# Patient Record
Sex: Male | Born: 1955 | Race: White | Hispanic: No | Marital: Married | Smoking: Former smoker
Health system: Southern US, Community
[De-identification: ages and names within clinical notes are randomized; demographics above are authoritative.]

## PROBLEM LIST (undated history)

## (undated) DIAGNOSIS — I10 Essential (primary) hypertension: Secondary | ICD-10-CM

## (undated) DIAGNOSIS — I779 Disorder of arteries and arterioles, unspecified: Secondary | ICD-10-CM

## (undated) DIAGNOSIS — I639 Cerebral infarction, unspecified: Secondary | ICD-10-CM

## (undated) DIAGNOSIS — F419 Anxiety disorder, unspecified: Secondary | ICD-10-CM

## (undated) DIAGNOSIS — E119 Type 2 diabetes mellitus without complications: Secondary | ICD-10-CM

## (undated) DIAGNOSIS — N200 Calculus of kidney: Secondary | ICD-10-CM

## (undated) DIAGNOSIS — I2510 Atherosclerotic heart disease of native coronary artery without angina pectoris: Secondary | ICD-10-CM

## (undated) DIAGNOSIS — C61 Malignant neoplasm of prostate: Secondary | ICD-10-CM

## (undated) DIAGNOSIS — F329 Major depressive disorder, single episode, unspecified: Secondary | ICD-10-CM

## (undated) DIAGNOSIS — Z87442 Personal history of urinary calculi: Secondary | ICD-10-CM

## (undated) DIAGNOSIS — E785 Hyperlipidemia, unspecified: Secondary | ICD-10-CM

## (undated) DIAGNOSIS — F32A Depression, unspecified: Secondary | ICD-10-CM

## (undated) HISTORY — DX: Essential (primary) hypertension: I10

## (undated) HISTORY — DX: Disorder of arteries and arterioles, unspecified: I77.9

## (undated) HISTORY — DX: Type 2 diabetes mellitus without complications: E11.9

## (undated) HISTORY — DX: Calculus of kidney: N20.0

## (undated) HISTORY — PX: PROSTATE BIOPSY: SHX241

## (undated) HISTORY — DX: Hyperlipidemia, unspecified: E78.5

## (undated) HISTORY — DX: Malignant neoplasm of prostate: C61

## (undated) HISTORY — PX: CORONARY ARTERY BYPASS GRAFT: SHX141

## (undated) HISTORY — DX: Atherosclerotic heart disease of native coronary artery without angina pectoris: I25.10

## (undated) HISTORY — DX: Cerebral infarction, unspecified: I63.9

## (undated) HISTORY — PX: OTHER SURGICAL HISTORY: SHX169

## (undated) HISTORY — DX: Anxiety disorder, unspecified: F41.9

---
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
## 2009-11-14 DIAGNOSIS — I639 Cerebral infarction, unspecified: Secondary | ICD-10-CM

## 2009-11-14 HISTORY — DX: Cerebral infarction, unspecified: I63.9

## 2010-07-08 ENCOUNTER — Ambulatory Visit: Payer: Self-pay | Admitting: Internal Medicine

## 2010-07-08 ENCOUNTER — Ambulatory Visit: Payer: Self-pay | Admitting: Pulmonary Disease

## 2010-07-08 ENCOUNTER — Inpatient Hospital Stay (HOSPITAL_COMMUNITY)
Admission: EM | Admit: 2010-07-08 | Discharge: 2010-07-16 | Disposition: A | Discharge status: Rehabilitation Facility | Payer: Self-pay | Source: Home / Self Care | Admitting: Emergency Medicine

## 2010-07-09 ENCOUNTER — Encounter (INDEPENDENT_AMBULATORY_CARE_PROVIDER_SITE_OTHER): Payer: Self-pay | Admitting: Emergency Medicine

## 2010-07-09 ENCOUNTER — Ambulatory Visit: Payer: Self-pay | Admitting: Physical Medicine & Rehabilitation

## 2010-07-12 ENCOUNTER — Encounter (INDEPENDENT_AMBULATORY_CARE_PROVIDER_SITE_OTHER): Payer: Self-pay | Admitting: Emergency Medicine

## 2010-07-16 ENCOUNTER — Ambulatory Visit: Payer: Self-pay | Admitting: Physical Medicine & Rehabilitation

## 2010-07-16 ENCOUNTER — Inpatient Hospital Stay (HOSPITAL_COMMUNITY)
Admission: RE | Admit: 2010-07-16 | Discharge: 2010-08-06 | Payer: Self-pay | Admitting: Physical Medicine & Rehabilitation

## 2010-07-24 ENCOUNTER — Ambulatory Visit: Payer: Self-pay | Admitting: Physical Medicine & Rehabilitation

## 2010-07-27 ENCOUNTER — Ambulatory Visit: Payer: Self-pay | Admitting: Psychology

## 2010-08-26 ENCOUNTER — Encounter (HOSPITAL_COMMUNITY)
Admission: RE | Admit: 2010-08-26 | Discharge: 2010-09-25 | Payer: Self-pay | Source: Home / Self Care | Admitting: Family Medicine

## 2010-08-30 ENCOUNTER — Encounter
Admission: RE | Admit: 2010-08-30 | Discharge: 2010-11-10 | Payer: Self-pay | Source: Home / Self Care | Attending: Physical Medicine & Rehabilitation | Admitting: Physical Medicine & Rehabilitation

## 2010-09-03 ENCOUNTER — Ambulatory Visit: Payer: Self-pay | Admitting: Physical Medicine & Rehabilitation

## 2010-09-08 ENCOUNTER — Ambulatory Visit (HOSPITAL_COMMUNITY)
Admission: RE | Admit: 2010-09-08 | Discharge: 2010-09-08 | Payer: Self-pay | Admitting: Physical Medicine & Rehabilitation

## 2010-09-27 ENCOUNTER — Encounter (HOSPITAL_COMMUNITY)
Admission: RE | Admit: 2010-09-27 | Discharge: 2010-10-27 | Payer: Self-pay | Source: Home / Self Care | Attending: Family Medicine | Admitting: Family Medicine

## 2010-10-01 ENCOUNTER — Ambulatory Visit: Payer: Self-pay | Admitting: Physical Medicine & Rehabilitation

## 2010-10-28 ENCOUNTER — Encounter (HOSPITAL_COMMUNITY)
Admission: RE | Admit: 2010-10-28 | Discharge: 2010-11-27 | Payer: Self-pay | Source: Home / Self Care | Attending: Family Medicine | Admitting: Family Medicine

## 2010-11-15 ENCOUNTER — Encounter
Admission: RE | Admit: 2010-11-15 | Discharge: 2010-11-16 | Payer: Self-pay | Source: Home / Self Care | Attending: Physical Medicine & Rehabilitation | Admitting: Physical Medicine & Rehabilitation

## 2010-11-16 ENCOUNTER — Ambulatory Visit
Admission: RE | Admit: 2010-11-16 | Discharge: 2010-11-16 | Payer: Self-pay | Source: Home / Self Care | Attending: Physical Medicine & Rehabilitation | Admitting: Physical Medicine & Rehabilitation

## 2010-11-29 ENCOUNTER — Encounter (HOSPITAL_COMMUNITY)
Admission: RE | Admit: 2010-11-29 | Discharge: 2010-12-14 | Payer: Self-pay | Source: Home / Self Care | Attending: Family Medicine | Admitting: Family Medicine

## 2010-12-05 ENCOUNTER — Encounter: Payer: Self-pay | Admitting: Physical Medicine & Rehabilitation

## 2010-12-17 ENCOUNTER — Ambulatory Visit (HOSPITAL_COMMUNITY)
Admission: RE | Admit: 2010-12-17 | Discharge: 2010-12-17 | Disposition: A | Discharge status: Home / Self Care | Payer: BC Managed Care – PPO | Source: Ambulatory Visit | Attending: Family Medicine | Admitting: Family Medicine

## 2010-12-17 DIAGNOSIS — E119 Type 2 diabetes mellitus without complications: Secondary | ICD-10-CM | POA: Insufficient documentation

## 2010-12-17 DIAGNOSIS — Z5189 Encounter for other specified aftercare: Secondary | ICD-10-CM | POA: Insufficient documentation

## 2010-12-17 DIAGNOSIS — M6281 Muscle weakness (generalized): Secondary | ICD-10-CM | POA: Insufficient documentation

## 2010-12-17 DIAGNOSIS — Z951 Presence of aortocoronary bypass graft: Secondary | ICD-10-CM | POA: Insufficient documentation

## 2010-12-17 DIAGNOSIS — I69959 Hemiplegia and hemiparesis following unspecified cerebrovascular disease affecting unspecified side: Secondary | ICD-10-CM | POA: Insufficient documentation

## 2010-12-17 DIAGNOSIS — R279 Unspecified lack of coordination: Secondary | ICD-10-CM | POA: Insufficient documentation

## 2010-12-17 DIAGNOSIS — M25519 Pain in unspecified shoulder: Secondary | ICD-10-CM | POA: Insufficient documentation

## 2010-12-17 DIAGNOSIS — M25619 Stiffness of unspecified shoulder, not elsewhere classified: Secondary | ICD-10-CM | POA: Insufficient documentation

## 2010-12-17 DIAGNOSIS — I10 Essential (primary) hypertension: Secondary | ICD-10-CM | POA: Insufficient documentation

## 2010-12-20 ENCOUNTER — Ambulatory Visit (HOSPITAL_COMMUNITY)
Admission: RE | Admit: 2010-12-20 | Discharge: 2010-12-20 | Disposition: A | Discharge status: Home / Self Care | Payer: BC Managed Care – PPO | Source: Ambulatory Visit | Attending: Family Medicine | Admitting: Family Medicine

## 2010-12-20 ENCOUNTER — Ambulatory Visit (HOSPITAL_COMMUNITY)
Admission: RE | Admit: 2010-12-20 | Discharge: 2010-12-20 | Disposition: A | Discharge status: Home / Self Care | Payer: BC Managed Care – PPO | Source: Ambulatory Visit | Attending: *Deleted | Admitting: *Deleted

## 2010-12-20 DIAGNOSIS — E119 Type 2 diabetes mellitus without complications: Secondary | ICD-10-CM | POA: Insufficient documentation

## 2010-12-20 DIAGNOSIS — R269 Unspecified abnormalities of gait and mobility: Secondary | ICD-10-CM | POA: Insufficient documentation

## 2010-12-20 DIAGNOSIS — Z951 Presence of aortocoronary bypass graft: Secondary | ICD-10-CM | POA: Insufficient documentation

## 2010-12-20 DIAGNOSIS — Z5189 Encounter for other specified aftercare: Secondary | ICD-10-CM | POA: Insufficient documentation

## 2010-12-20 DIAGNOSIS — M25619 Stiffness of unspecified shoulder, not elsewhere classified: Secondary | ICD-10-CM | POA: Insufficient documentation

## 2010-12-20 DIAGNOSIS — M6281 Muscle weakness (generalized): Secondary | ICD-10-CM | POA: Insufficient documentation

## 2010-12-20 DIAGNOSIS — I10 Essential (primary) hypertension: Secondary | ICD-10-CM | POA: Insufficient documentation

## 2010-12-20 DIAGNOSIS — M25629 Stiffness of unspecified elbow, not elsewhere classified: Secondary | ICD-10-CM | POA: Insufficient documentation

## 2010-12-20 DIAGNOSIS — I69998 Other sequelae following unspecified cerebrovascular disease: Secondary | ICD-10-CM | POA: Insufficient documentation

## 2010-12-22 ENCOUNTER — Ambulatory Visit (HOSPITAL_COMMUNITY)
Admission: RE | Admit: 2010-12-22 | Discharge: 2010-12-22 | Disposition: A | Discharge status: Home / Self Care | Payer: BC Managed Care – PPO | Source: Ambulatory Visit | Attending: Family Medicine | Admitting: Family Medicine

## 2010-12-22 ENCOUNTER — Ambulatory Visit (HOSPITAL_COMMUNITY)
Admission: RE | Admit: 2010-12-22 | Discharge: 2010-12-22 | Disposition: A | Discharge status: Home / Self Care | Payer: BC Managed Care – PPO | Source: Ambulatory Visit | Attending: *Deleted | Admitting: *Deleted

## 2010-12-22 DIAGNOSIS — I69959 Hemiplegia and hemiparesis following unspecified cerebrovascular disease affecting unspecified side: Secondary | ICD-10-CM | POA: Insufficient documentation

## 2010-12-22 DIAGNOSIS — M25619 Stiffness of unspecified shoulder, not elsewhere classified: Secondary | ICD-10-CM | POA: Insufficient documentation

## 2010-12-22 DIAGNOSIS — Z951 Presence of aortocoronary bypass graft: Secondary | ICD-10-CM | POA: Insufficient documentation

## 2010-12-22 DIAGNOSIS — E119 Type 2 diabetes mellitus without complications: Secondary | ICD-10-CM | POA: Insufficient documentation

## 2010-12-22 DIAGNOSIS — M6281 Muscle weakness (generalized): Secondary | ICD-10-CM | POA: Insufficient documentation

## 2010-12-22 DIAGNOSIS — I10 Essential (primary) hypertension: Secondary | ICD-10-CM | POA: Insufficient documentation

## 2010-12-22 DIAGNOSIS — R279 Unspecified lack of coordination: Secondary | ICD-10-CM | POA: Insufficient documentation

## 2010-12-22 DIAGNOSIS — R269 Unspecified abnormalities of gait and mobility: Secondary | ICD-10-CM | POA: Insufficient documentation

## 2010-12-22 DIAGNOSIS — Z5189 Encounter for other specified aftercare: Secondary | ICD-10-CM | POA: Insufficient documentation

## 2010-12-24 ENCOUNTER — Ambulatory Visit (HOSPITAL_COMMUNITY)
Admission: RE | Admit: 2010-12-24 | Discharge: 2010-12-24 | Disposition: A | Discharge status: Home / Self Care | Payer: BC Managed Care – PPO | Source: Ambulatory Visit | Admitting: Physical Therapy

## 2010-12-27 ENCOUNTER — Ambulatory Visit (HOSPITAL_COMMUNITY): Payer: BC Managed Care – PPO | Admitting: Physical Therapy

## 2010-12-27 ENCOUNTER — Ambulatory Visit (HOSPITAL_COMMUNITY): Payer: Self-pay | Admitting: Specialist

## 2010-12-27 ENCOUNTER — Ambulatory Visit (HOSPITAL_COMMUNITY): Payer: Self-pay | Admitting: Physical Therapy

## 2010-12-27 ENCOUNTER — Encounter (HOSPITAL_COMMUNITY)
Admission: RE | Admit: 2010-12-27 | Discharge: 2010-12-27 | Disposition: A | Discharge status: Home / Self Care | Payer: BC Managed Care – PPO | Source: Ambulatory Visit | Attending: Family Medicine | Admitting: Family Medicine

## 2010-12-27 DIAGNOSIS — M6281 Muscle weakness (generalized): Secondary | ICD-10-CM | POA: Insufficient documentation

## 2010-12-27 DIAGNOSIS — I69998 Other sequelae following unspecified cerebrovascular disease: Secondary | ICD-10-CM | POA: Insufficient documentation

## 2010-12-27 DIAGNOSIS — M25519 Pain in unspecified shoulder: Secondary | ICD-10-CM | POA: Insufficient documentation

## 2010-12-27 DIAGNOSIS — I69959 Hemiplegia and hemiparesis following unspecified cerebrovascular disease affecting unspecified side: Secondary | ICD-10-CM | POA: Insufficient documentation

## 2010-12-27 DIAGNOSIS — Z5189 Encounter for other specified aftercare: Secondary | ICD-10-CM | POA: Insufficient documentation

## 2010-12-27 DIAGNOSIS — R269 Unspecified abnormalities of gait and mobility: Secondary | ICD-10-CM | POA: Insufficient documentation

## 2010-12-29 ENCOUNTER — Ambulatory Visit (HOSPITAL_COMMUNITY)
Admission: RE | Admit: 2010-12-29 | Discharge: 2010-12-29 | Disposition: A | Discharge status: Home / Self Care | Payer: BC Managed Care – PPO | Source: Ambulatory Visit | Attending: *Deleted | Admitting: *Deleted

## 2010-12-29 ENCOUNTER — Ambulatory Visit (HOSPITAL_COMMUNITY): Payer: Self-pay | Admitting: Physical Therapy

## 2010-12-31 ENCOUNTER — Encounter: Payer: BC Managed Care – PPO | Attending: Physical Medicine & Rehabilitation

## 2010-12-31 ENCOUNTER — Ambulatory Visit (HOSPITAL_BASED_OUTPATIENT_CLINIC_OR_DEPARTMENT_OTHER): Payer: BC Managed Care – PPO | Admitting: Physical Medicine & Rehabilitation

## 2010-12-31 DIAGNOSIS — G8110 Spastic hemiplegia affecting unspecified side: Secondary | ICD-10-CM

## 2010-12-31 DIAGNOSIS — M75100 Unspecified rotator cuff tear or rupture of unspecified shoulder, not specified as traumatic: Secondary | ICD-10-CM

## 2010-12-31 DIAGNOSIS — S43429A Sprain of unspecified rotator cuff capsule, initial encounter: Secondary | ICD-10-CM | POA: Insufficient documentation

## 2010-12-31 DIAGNOSIS — M67919 Unspecified disorder of synovium and tendon, unspecified shoulder: Secondary | ICD-10-CM | POA: Insufficient documentation

## 2010-12-31 DIAGNOSIS — M62838 Other muscle spasm: Secondary | ICD-10-CM | POA: Insufficient documentation

## 2010-12-31 DIAGNOSIS — M7520 Bicipital tendinitis, unspecified shoulder: Secondary | ICD-10-CM

## 2010-12-31 DIAGNOSIS — I69959 Hemiplegia and hemiparesis following unspecified cerebrovascular disease affecting unspecified side: Secondary | ICD-10-CM | POA: Insufficient documentation

## 2010-12-31 DIAGNOSIS — X58XXXA Exposure to other specified factors, initial encounter: Secondary | ICD-10-CM | POA: Insufficient documentation

## 2010-12-31 DIAGNOSIS — M719 Bursopathy, unspecified: Secondary | ICD-10-CM | POA: Insufficient documentation

## 2011-01-07 ENCOUNTER — Ambulatory Visit (HOSPITAL_COMMUNITY)
Admission: RE | Admit: 2011-01-07 | Discharge: 2011-01-07 | Disposition: A | Discharge status: Home / Self Care | Payer: BC Managed Care – PPO | Source: Ambulatory Visit | Attending: Family Medicine | Admitting: Family Medicine

## 2011-01-07 DIAGNOSIS — I69959 Hemiplegia and hemiparesis following unspecified cerebrovascular disease affecting unspecified side: Secondary | ICD-10-CM | POA: Insufficient documentation

## 2011-01-07 DIAGNOSIS — R279 Unspecified lack of coordination: Secondary | ICD-10-CM | POA: Insufficient documentation

## 2011-01-07 DIAGNOSIS — M6281 Muscle weakness (generalized): Secondary | ICD-10-CM | POA: Insufficient documentation

## 2011-01-07 DIAGNOSIS — I10 Essential (primary) hypertension: Secondary | ICD-10-CM | POA: Insufficient documentation

## 2011-01-07 DIAGNOSIS — M25519 Pain in unspecified shoulder: Secondary | ICD-10-CM | POA: Insufficient documentation

## 2011-01-07 DIAGNOSIS — E119 Type 2 diabetes mellitus without complications: Secondary | ICD-10-CM | POA: Insufficient documentation

## 2011-01-07 DIAGNOSIS — Z951 Presence of aortocoronary bypass graft: Secondary | ICD-10-CM | POA: Insufficient documentation

## 2011-01-07 DIAGNOSIS — IMO0001 Reserved for inherently not codable concepts without codable children: Secondary | ICD-10-CM | POA: Insufficient documentation

## 2011-01-07 DIAGNOSIS — M25619 Stiffness of unspecified shoulder, not elsewhere classified: Secondary | ICD-10-CM | POA: Insufficient documentation

## 2011-01-10 ENCOUNTER — Ambulatory Visit (HOSPITAL_COMMUNITY)
Admission: RE | Admit: 2011-01-10 | Discharge: 2011-01-10 | Disposition: A | Discharge status: Home / Self Care | Payer: BC Managed Care – PPO | Source: Ambulatory Visit | Attending: *Deleted | Admitting: *Deleted

## 2011-01-14 ENCOUNTER — Ambulatory Visit (HOSPITAL_COMMUNITY)
Admission: RE | Admit: 2011-01-14 | Discharge: 2011-01-14 | Disposition: A | Discharge status: Home / Self Care | Payer: BC Managed Care – PPO | Source: Ambulatory Visit | Attending: Family Medicine | Admitting: Family Medicine

## 2011-01-14 DIAGNOSIS — R279 Unspecified lack of coordination: Secondary | ICD-10-CM | POA: Insufficient documentation

## 2011-01-14 DIAGNOSIS — I69959 Hemiplegia and hemiparesis following unspecified cerebrovascular disease affecting unspecified side: Secondary | ICD-10-CM | POA: Insufficient documentation

## 2011-01-14 DIAGNOSIS — Z5189 Encounter for other specified aftercare: Secondary | ICD-10-CM | POA: Insufficient documentation

## 2011-01-14 DIAGNOSIS — R262 Difficulty in walking, not elsewhere classified: Secondary | ICD-10-CM | POA: Insufficient documentation

## 2011-01-14 DIAGNOSIS — M6281 Muscle weakness (generalized): Secondary | ICD-10-CM | POA: Insufficient documentation

## 2011-01-17 ENCOUNTER — Ambulatory Visit (HOSPITAL_COMMUNITY)
Admission: RE | Admit: 2011-01-17 | Discharge: 2011-01-17 | Disposition: A | Discharge status: Home / Self Care | Payer: BC Managed Care – PPO | Source: Ambulatory Visit | Attending: *Deleted | Admitting: *Deleted

## 2011-01-18 ENCOUNTER — Emergency Department (HOSPITAL_COMMUNITY): Payer: BC Managed Care – PPO

## 2011-01-18 ENCOUNTER — Observation Stay (HOSPITAL_COMMUNITY)
Admission: EM | Admit: 2011-01-18 | Discharge: 2011-01-20 | DRG: 025 | Disposition: A | Discharge status: Home / Self Care | Payer: BC Managed Care – PPO | Attending: Internal Medicine | Admitting: Internal Medicine

## 2011-01-18 DIAGNOSIS — I10 Essential (primary) hypertension: Secondary | ICD-10-CM | POA: Insufficient documentation

## 2011-01-18 DIAGNOSIS — E119 Type 2 diabetes mellitus without complications: Secondary | ICD-10-CM | POA: Insufficient documentation

## 2011-01-18 DIAGNOSIS — M545 Low back pain, unspecified: Secondary | ICD-10-CM | POA: Insufficient documentation

## 2011-01-18 DIAGNOSIS — Z7902 Long term (current) use of antithrombotics/antiplatelets: Secondary | ICD-10-CM | POA: Insufficient documentation

## 2011-01-18 DIAGNOSIS — I2510 Atherosclerotic heart disease of native coronary artery without angina pectoris: Secondary | ICD-10-CM | POA: Insufficient documentation

## 2011-01-18 DIAGNOSIS — K219 Gastro-esophageal reflux disease without esophagitis: Secondary | ICD-10-CM | POA: Insufficient documentation

## 2011-01-18 DIAGNOSIS — E669 Obesity, unspecified: Secondary | ICD-10-CM | POA: Insufficient documentation

## 2011-01-18 DIAGNOSIS — Z79899 Other long term (current) drug therapy: Secondary | ICD-10-CM | POA: Insufficient documentation

## 2011-01-18 DIAGNOSIS — R569 Unspecified convulsions: Principal | ICD-10-CM | POA: Insufficient documentation

## 2011-01-18 DIAGNOSIS — E7800 Pure hypercholesterolemia, unspecified: Secondary | ICD-10-CM | POA: Insufficient documentation

## 2011-01-18 DIAGNOSIS — Z8673 Personal history of transient ischemic attack (TIA), and cerebral infarction without residual deficits: Secondary | ICD-10-CM | POA: Insufficient documentation

## 2011-01-18 DIAGNOSIS — G8929 Other chronic pain: Secondary | ICD-10-CM | POA: Insufficient documentation

## 2011-01-18 LAB — DIFFERENTIAL
Basophils Absolute: 0 10*3/uL (ref 0.0–0.1)
Basophils Relative: 0 % (ref 0–1)
Eosinophils Absolute: 0.1 10*3/uL (ref 0.0–0.7)
Eosinophils Relative: 2 % (ref 0–5)
Lymphocytes Relative: 41 % (ref 12–46)
Lymphs Abs: 3 10*3/uL (ref 0.7–4.0)
Monocytes Absolute: 0.6 K/uL (ref 0.1–1.0)
Monocytes Relative: 8 % (ref 3–12)
Neutro Abs: 3.5 K/uL (ref 1.7–7.7)
Neutrophils Relative %: 49 % (ref 43–77)

## 2011-01-18 LAB — URINALYSIS, ROUTINE W REFLEX MICROSCOPIC
Bilirubin Urine: NEGATIVE
Glucose, UA: NEGATIVE mg/dL
Hgb urine dipstick: NEGATIVE
Ketones, ur: NEGATIVE mg/dL
Leukocytes, UA: NEGATIVE
Nitrite: NEGATIVE
Protein, ur: 30 mg/dL — AB
Specific Gravity, Urine: 1.025 (ref 1.005–1.030)
Urobilinogen, UA: 1 mg/dL (ref 0.0–1.0)
pH: 5.5 (ref 5.0–8.0)

## 2011-01-18 LAB — RAPID URINE DRUG SCREEN, HOSP PERFORMED
Amphetamines: POSITIVE — AB
Barbiturates: NOT DETECTED
Benzodiazepines: NOT DETECTED
Cocaine: NOT DETECTED
Opiates: NOT DETECTED
Tetrahydrocannabinol: NOT DETECTED

## 2011-01-18 LAB — CBC
HCT: 42.1 % (ref 39.0–52.0)
Hemoglobin: 14.4 g/dL (ref 13.0–17.0)
MCH: 29.9 pg (ref 26.0–34.0)
MCHC: 34.2 g/dL (ref 30.0–36.0)
MCV: 87.5 fL (ref 78.0–100.0)
Platelets: 208 10*3/uL (ref 150–400)
RBC: 4.81 MIL/uL (ref 4.22–5.81)
RDW: 13.2 % (ref 11.5–15.5)
WBC: 7.3 10*3/uL (ref 4.0–10.5)

## 2011-01-18 LAB — COMPREHENSIVE METABOLIC PANEL
Albumin: 4 g/dL (ref 3.5–5.2)
Alkaline Phosphatase: 24 U/L — ABNORMAL LOW (ref 39–117)
BUN: 14 mg/dL (ref 6–23)
Chloride: 107 mEq/L (ref 96–112)
Creatinine, Ser: 0.8 mg/dL (ref 0.4–1.5)
GFR calc non Af Amer: >60 mL/min (ref >60)
Glucose, Bld: 116 mg/dL — ABNORMAL HIGH (ref 70–99)
Total Bilirubin: 0.4 mg/dL (ref 0.3–1.2)

## 2011-01-18 LAB — GLUCOSE, CAPILLARY: Glucose-Capillary: 98 mg/dL (ref 70–99)

## 2011-01-18 LAB — URINE MICROSCOPIC-ADD ON

## 2011-01-18 LAB — COMPREHENSIVE METABOLIC PANEL WITH GFR
ALT: 22 U/L (ref 0–53)
AST: 24 U/L (ref 0–37)
CO2: 23 meq/L (ref 19–32)
Calcium: 9.4 mg/dL (ref 8.4–10.5)
GFR calc Af Amer: >60 mL/min (ref >60)
Potassium: 3.9 meq/L (ref 3.5–5.1)
Sodium: 142 meq/L (ref 135–145)
Total Protein: 6.5 g/dL (ref 6.0–8.3)

## 2011-01-18 LAB — CK TOTAL AND CKMB (NOT AT ARMC)
CK, MB: 0.8 ng/mL (ref 0.3–4.0)
Relative Index: INVALID (ref 0.0–2.5)
Total CK: 26 U/L (ref 7–232)

## 2011-01-18 LAB — PROTIME-INR
INR: 1.12 (ref 0.00–1.49)
Prothrombin Time: 14.6 seconds (ref 11.6–15.2)

## 2011-01-18 LAB — APTT: aPTT: 24 seconds (ref 24–37)

## 2011-01-18 LAB — TROPONIN I: Troponin I: 0.01 ng/mL (ref 0.00–0.06)

## 2011-01-19 ENCOUNTER — Inpatient Hospital Stay (HOSPITAL_COMMUNITY): Payer: BC Managed Care – PPO

## 2011-01-19 LAB — COMPREHENSIVE METABOLIC PANEL
ALT: 22 U/L (ref 0–53)
Albumin: 3.8 g/dL (ref 3.5–5.2)
Alkaline Phosphatase: 23 U/L — ABNORMAL LOW (ref 39–117)
BUN: 11 mg/dL (ref 6–23)
Chloride: 107 mEq/L (ref 96–112)
Glucose, Bld: 121 mg/dL — ABNORMAL HIGH (ref 70–99)
Potassium: 4.1 mEq/L (ref 3.5–5.1)
Sodium: 140 mEq/L (ref 135–145)
Total Bilirubin: 0.6 mg/dL (ref 0.3–1.2)
Total Protein: 6.2 g/dL (ref 6.0–8.3)

## 2011-01-19 LAB — GLUCOSE, CAPILLARY: Glucose-Capillary: 139 mg/dL — ABNORMAL HIGH (ref 70–99)

## 2011-01-20 ENCOUNTER — Inpatient Hospital Stay (HOSPITAL_COMMUNITY): Payer: BC Managed Care – PPO

## 2011-01-20 LAB — GLUCOSE, CAPILLARY
Glucose-Capillary: 91 mg/dL (ref 70–99)
Glucose-Capillary: 108 mg/dL — ABNORMAL HIGH (ref 70–99)

## 2011-01-20 MED ORDER — GADOBENATE DIMEGLUMINE 529 MG/ML IV SOLN
17.0000 mL | Freq: Once | INTRAVENOUS | Status: AC
  Administered 2011-01-20: 17 mL via INTRAVENOUS

## 2011-01-20 NOTE — H&P (Signed)
NAME:  Arthur Cisneros, Arthur Cisneros NO.:  [id number]  MEDICAL RECORD NO.:  [id number]           PATIENT TYPE:  E  LOCATION:  [facility]                         FACILITY:  [facility]  PHYSICIAN:  Vania Rea, M.D. DATE OF BIRTH:  02-06-1956  DATE OF ADMISSION:  01/18/2011 DATE OF DISCHARGE:                             HISTORY & PHYSICAL   PRIMARY CARE PHYSICIAN:  Dr. Karrie Doffing, [facility].  NEUROLOGIST:  Pramod P. Pearlean Brownie, MD  DICTATING PHYSICIAN:  Vania Rea, MD  CHIEF COMPLAINT:  Altered mental status, today.  HISTORY OF PRESENT ILLNESS:  This is a 55 year old Caucasian gentleman with a history of hypertension, diabetes, and coronary artery disease, who was admitted to this service in August 2011, with an acute right cerebral stroke.  Eventually spent 1 month in our rehab facility and was discharged with a residual left hemiplegia.  The patient has been doing very well with home rehab, but while sitting today, getting chair bath from his wife.  The patient suddenly began to have jerking movements of his right upper and lower extremity.  His head flipped back, his eyes rolled to the back of his head and he became unresponsive.  This movements and unresponsiveness lasted for about 10-15 minutes according to his wife and daughter.  Eventually, he became more aware, began to speak, but was not making sense.  EMS was called and he was transported back to the emergency room.  On discussion the incidents with the patient, he says he remembers sitting in the chair, getting a shave, then he has some remembrance of being in the ambulance and he clearly remembers when he was coming in through the hospital door.  His family said he was confused for about 40-45 minutes after the incident, but then after he was settled in a bed in the hospital, he was completely back to his baseline mental status and he had no new weaknesses.  The patient was brought into the  hospital of a cold stroke.  He was evaluated by the neurology team prior to being seen by the hospitalist and was assessed as not having a stroke and consistent with his history, felt he was more like was having a new onset seizure.  They advised admission by the Hospitalist Service.  The patient denies any fever, cough or cold, chest pain, shortness of breath, nausea, vomiting, or diarrhea.  He denies any headache, denies any past history of seizures.  PAST MEDICAL HISTORY:  Coronary artery disease, right hemispheric stroke as noted above, diabetes, hypertension, chronic back pain secondary to job related injury.  MEDICATIONS: 1. Tramadol 100 mg at bedtime. 2. Ranitidine 150 mg daily. 3. Senna over the counter 1 tablet each evening. 4. Mirtazapine 30 mg at bedtime. 5. Losartan 50 mg daily. 6. Tizanidine 2 mg at bedtime. 7. Plavix 75 mg daily. 8. Metformin 500 mg twice daily. 9. Lipitor 40 mg daily. 10.The patient does report that he used to taking Ritalin back in     September, but has not taken any Ritalin since and he does notthink he could have taken any by mistake.  ALLERGIES:  No known drug allergies.  SOCIAL HISTORY:  Remote history of occasional tobacco use.  Remote history of occasional alcohol use, currently neither drinking nor smoking, used to be a Medical illustrator for [employer], but prior to his stroke was on long-term disability because of a back injury on the job. His code status is full code.  FAMILY HISTORY:  Significant for diabetes, hypertension, strokes, coronary artery disease, and gout.  REVIEW OF SYSTEMS:  Other than noted above significant only for the fact that patient ambulates with the assistance of a quad cane, sometimes with a single cane for short distances and for extended excursions, he uses a wheelchair.  Other than this, a complete review of systems is unremarkable.  He does have a residual left hemiplegia.  PHYSICAL EXAMINATION:   GENERAL:  A very pleasant middle-aged Caucasian gentleman, sitting up in the stretcher. VITAL SIGNS:  His temperature is 98.0, pulse 82, respirations 19, blood pressure 130/73, saturating 97% on room air. HEENT:  His pupils are round and equal.  Mucous membranes pink. Anicteric.  No cervical lymphadenopathy.  No thyromegaly.  No carotid bruit.  He has left-sided facial weakness. CHEST:  Clear to auscultation bilaterally. CARDIOVASCULAR SYSTEM:  Regular rhythm.  No murmurs. ABDOMEN:  Obese, soft, nontender. EXTREMITIES:  Without edema, has 2+ dorsalis pedis pulses. SKIN:  Warm.  There is no ulcerations. CENTRAL NERVOUS SYSTEM:  He has a left-sided hemiplegia.  He is unable to move his fingers, but he is able to flex his left elbow.  He is hyperreflexic at the left knee and he has clonus at the left ankle.  His labs, his stroke panel is essentially unremarkable.  His white count is 7.3, hemoglobin 14.4, platelets 203.  His sodium is 142, potassium 3.9, chloride 107, CO2 of 23, glucose 116, BUN 14, creatinine 0.8.  His liver functions are unremarkable.  His cardiac enzymes are completely normal.  His albumin is 4.0 and his calcium 9.4.  His urinalysis shows 30 mg/dL of protein, with nitrites and leukocyte esterase negative and is otherwise unremarkable.  Urine microscopy is unremarkable.  Urine drug screen is positive for amphetamines and negative for other tested constituents.  CT scan of his head without contrast shows no acute hemorrhage or infarct and old right middle cerebral infarct is noted. No acute abnormality is evident.  ASSESSMENT: 1. Altered mental status, right jerking movements and 40-minute     confusional state suggestive of a seizure.  Since this gentleman     has a left hemiplegia, and may explain why he was only shaking on     the right side, however, it could also been a focal seizure. 2. Diabetes type 2, controlled. 3. Hypertension, controlled. 4. History of  coronary artery disease. 5. History of chronic back pain. 6. Amphetamine positive urine in a gentleman for this presenting with     a seizure disorder.  PLAN:     As advised by the Neurologist Service, we will admit this gentleman     for further observation.  We will     continue his home medication.  We will ensure adequate hydration.     We will start him on Keppra and we will get MRI of the brain for     further evaluation.  Other plans as per orders.     Vania Rea, M.D.     LC/MEDQ  D:  01/18/2011  T:  01/18/2011  Job:  [id number]  cc:   Pramod P. Pearlean Brownie,  MD Dr. Karrie Doffing  Electronically Signed by Vania Rea M.D. on 01/20/2011 03:06:11 AM

## 2011-01-21 ENCOUNTER — Ambulatory Visit (HOSPITAL_COMMUNITY): Payer: BC Managed Care – PPO | Admitting: Physical Therapy

## 2011-01-21 ENCOUNTER — Ambulatory Visit (HOSPITAL_COMMUNITY): Payer: BC Managed Care – PPO | Admitting: Occupational Therapy

## 2011-01-21 NOTE — Discharge Summary (Signed)
NAME:  Arthur Cisneros, Arthur Cisneros NO.:  [id number]  MEDICAL RECORD NO.:  [id number]           PATIENT TYPE:  I  LOCATION:  6705                         FACILITY:  [facility]  PHYSICIAN:  Andreas Blower, MD       DATE OF BIRTH:  1956/07/31  DATE OF ADMISSION:  01/18/2011 DATE OF DISCHARGE:  01/20/2011                              DISCHARGE SUMMARY   PRIMARY CARE PHYSICIAN:  Arlyss Queen, MD  NEUROLOGIST:  Pramod P. Pearlean Brownie, MD  DISCHARGE DIAGNOSES: 1. New onset of seizure. 2. Altered mental status/acute delirium secondary to seizure. 3. Type 2 diabetes. 4. Hypertension. 5. History of coronary artery disease status post coronary artery     bypass graft. 6. History of chronic back pain. 7. History of cerebrovascular accident. 8. Hypercholesterolemia. 9. History of gastroesophageal reflux disease. 10.History of left shoulder subluxation. 11.History of post stroke depression. 12.Anorexia. 13.History of obesity.  DISCHARGE MEDICATIONS: 1. Keppra 500 mg p.o. twice daily. 2. Atorvastatin 40 mg p.o. daily. 3. Losartan 500 mg p.o. daily. 4. Metformin 500 mg p.o. twice daily. 5. Mirtazapine 30 mg p.o. daily at bedtime. 6. Plavix 75 mg p.o. daily 7. Ranitidine 150 mg p.o. daily 8. Senna/docusate 1 tablet p.o. q.p.m. 9. Tizanidine 2 mg daily at bedtime. 10.Tramadol 100 mg p.o. at bedtime.  BRIEF ADMITTING HISTORY AND PHYSICAL:  Arthur Cisneros is a 55 year old Caucasian male with history of hypertension, diabetes, coronary artery disease and CVA who presented with altered mental status and sudden jerking like movement of his right upper and lower extremity.  RADIOLOGY/IMAGING:  The patient had a head CT without contrast on January 18, 2011, which showed old right MCA infarct.  No acute abnormalities evident. The patient had MRI of the head with and without contrast, which showed asymmetric increased PT signal without enhancement of the left hippocampus.  This might be seizure  related.  Chronic extensive right MCA infarct and associated sequelae, mild nonspecific cerebral white matter signal changes everywhere, decreased flow or occlusion of the right ICA siphon.  Flow has probably reconstituted the right ICA terminus. The patient had an EEG on January 19, 2011, which showed abnormal study due to relative slowing of the right temporoparietal location.  This study does not reflect any seizure or epileptiform discharges.  LABORATORY DATA:  CBC shows white count of 7.3, hemoglobin 14.4, hematocrit 42.1, platelet count 208.  Electrolytes normal with a creatinine of 0.63.  Liver function tests normal.  Troponin initially was 0.01.  Urine drug screen was positive for amphetamines.  UA was negative for nitrates and leukocytes.  HOSPITAL COURSE BY PROBLEM: 1. Seizure.  Initially, the patient was started on Keppra.  The     patient had an MRI and EEG as noted above.  Neurology was also     consulted.  Neurology recommended continuing Keppra twice daily at     the time of discharge, and to follow with Dr. Pearlean Brownie for further     titration of medications, outpatient. 2. Acute delirium secondary to seizure improved during the course of     hospital stay. 3. Type 2 diabetes, stable.  Continue the patient on medications. 4. Hypertension, stable.  Continue the patient on losartan. 5. Depression.  Continue the patient on mirtazapine. 6. History of coronary artery disease, stable, not an active issue     during the course of the hospital stay. 7. History of CVA.  Continue the patient on Plavix. 8. History of chronic low back pain.  Continue the patient on tramadol     and tizanidine.  DISPOSITION AND FOLLOWUP:  The patient to follow up with his primary care physician in 1-2 weeks.  The patient to follow up with Dr. Pearlean Brownie is 2-3 weeks.  Time spent on discharge talking to the patient's family and coordinating care was 35 minutes.   Andreas Blower, MD   SR/MEDQ  D:   01/20/2011  T:  01/21/2011  Job:  [id number]  Electronically Signed by Wardell Heath Unita Detamore  on 01/21/2011 10:02:26 PM

## 2011-01-21 NOTE — Op Note (Addendum)
  NAME:  Arthur Cisneros, Arthur Cisneros NO.:  [id number]  MEDICAL RECORD NO.:  [id number]           PATIENT TYPE:  I  LOCATION:  6705                         FACILITY:  [facility]  PHYSICIAN:  Levie Heritage, MD       DATE OF BIRTH:  09-30-1956  DATE OF PROCEDURE:  01/19/2011 DATE OF DISCHARGE:                              OPERATIVE REPORT   HISTORY:  The patient is a 55 year old man admitted for seizure that is new onset after the history of recent stroke in August of 2011 related to left-sided weakness, slurred speech, left facial droop.  He also is known to have hypertension, diabetes mellitus, pulmonary artery disease status post CABG.  He was with his wife when he was noted to have sudden limping and his head turned to backward and then he started to have jerks of his own body.  He was confused and combative afterwards.  Eyes were also deviated according to the wife.  During the study, the patient has been back to his baseline.  MEDICATIONS:  He is on Keppra, Ultram, Pepcid, Senokot, Remeron, Cozaar, Plavix, Venoflex, Glucophage, and Crestor.  EEG DURATION:  This is a 21.5 minutes of EEG recording.  EEG DESCRIPTION:  This is a routine 18-channel EEG recording with one channel devoted to limited EKG recording.  Activation procedure was performed using the photic stimulation and the study was performed in awake and drowsy state.  As the EEG opens up, I noticed that the posterior dominant rhythm is in 11 Hz alpha ranges.  It is important to note that relative slowing is noted in the right temporoparietal area.  However, there are no seizures or epileptiform discharges recorded during this study.  There is preservation of the anterior-posterior gradient throughout and the spindle formation was also noted as the patient got drowsy during this study.  No driving was needed with the photic stimulation.  No seizures or epileptiform discharges were recorded otherwise.  EEG  INTERPRETATION:  This is an abnormal study due to the relative slowing on the right temporoparietal location; however, this could be reflective of a structural pathology; therefore, the clinical correlation with the imaging is advised.  There are no electrographic seizures or the epileptiform discharges seen on tracing.  CLINICAL COORELATION: The absence of electrographic seizures and the lack os epileptiform discharges on EEG doesn't rule out the possibility of epilepsy, specially if there is a cortical structural pathology effecting right hemisphere.          ______________________________ Levie Heritage, MD     WS/MEDQ  D:  01/19/2011  T:  01/20/2011  Job:  [id number]  Electronically Signed by Levie Heritage MD on 01/20/2011 10:03:22 AM

## 2011-01-23 NOTE — Consult Note (Signed)
NAME:  [name], [name] NO.:  [id number]  MEDICAL RECORD NO.:  [id number]           PATIENT TYPE:  I  LOCATION:  6705                         FACILITY:  [facility]  PHYSICIAN:  Thana Farr, MD    DATE OF BIRTH:  1956/05/07  DATE OF CONSULTATION:  01/18/2011 DATE OF DISCHARGE:                                CONSULTATION   HISTORY:  Arthur Cisneros is a 55 year old gentleman, who was being bathed by his wife today.  She noticed that acutely he became more limp and fell backwards.  His head began to go back and then he had some jerking of both upper extremities, the right jerking more than the left.  This lasted for a short period of time and then the patient was poorly responsive.  When he started to regain responsiveness, he was confused and combative.  EMS noted eye deviation.  It is unclear exactly to what direction.  The patient was brought in as a code stroke.  With initial examination, NIH stroke scale was 11.  The patient's at home modified Rankin scale of 3.  PAST MEDICAL HISTORY: 1. Coronary artery disease, status post CABG, approximately 10 years     ago. 2. Diabetes. 3. Hypertension. 4. Hypercholesterolemia. 5. Stroke in August 2011.  MEDICATIONS:  Tizanidine, Plavix, mirtazapine, docusate, atorvastatin, ranitidine, losartan, tramadol, metformin.  ALLERGIES:  No known drug allergies.  SOCIAL HISTORY:  The patient has no history of alcohol, tobacco, or illicit drug abuse.  He is married.  PHYSICAL EXAMINATION:  Blood pressure 153/88, heart rate 87, respiratory rate 17, temperature 98.3, O2 sat 95% on 2 L nasal cannula.  On mental status, the patient is alert, but confused.  He can follow simple commands.  He states that no memory of the event.  Speech is dysarthric. On cranial nerve testing II, there is some decrease in visual field to the left.  III, IV, VI, extraocular movements intact.  The pupils are reactive.  V and VII, left facial droop.  VIII  is grossly intact.  IX and X, decreased gag.  XI, decreased left shoulder shrug.  XII, midline tongue extension.  On motor exam, the patient has 0-1/5 in the left upper extremity.  He has 3+/5 in the left lower extremity.  He has 5/5 on the right upper extremity and right lower extremity.  Deep tendon reflexes are increased on the left.  Plantars are downgoing on the right and equivocal on the left.  On cerebellar testing, finger-to-nose intact on the right.  Heel-to-shin is intact bilaterally.  LABORATORY DATA:  Hemoglobin and hematocrit 14.4 and 42.1 respectively. White blood cell count 7.3, platelet count 208.  PT, INR, and PTT are 14.6, 1.12, and 24.  CT showed an old right MCA infarct.  No acute changes noted.  ASSESSMENT:  Arthur Cisneros is a 55 year old male, who presents with altered mental status.  Obtaining history was quite difficult.  It was difficult to determine exactly which side the patient was having his weakness on and exactly which way he had eye deviation.  It seems though from the history that it is very  likely the patient had a seizure and not a new infarct.  With the confusion and history, cannot rule out a possible transient ischemic attack.  The patient has recovered quite quickly.  He is felt to be at baseline at this time.  He is therefore not a t-PA candidate.  PLAN: 1. MRI of the brain. 2. If no new ischemic event is noted on MRI of the brain, I would not     do further stroke workup.  If new ischemic event is noted, we then     perform echocardiogram and carotid Dopplers. 3. EEG. 4. Start Keppra 500 mg b.i.d. 5. Continue Plavix. 6. Seizure precautions.  Patient is considered critically ill with an increased possibility of further neurologic decompensation.  High complexity decision making required.  75 minutes spent in face-to-face consultation and management of the patient.         ______________________________ Thana Farr, MD     LR/MEDQ  D:   01/18/2011  T:  01/19/2011  Job:  [id number]  Electronically Signed by Thana Farr MD on 01/23/2011 11:53:27 PM

## 2011-01-24 ENCOUNTER — Ambulatory Visit (HOSPITAL_COMMUNITY)
Admission: RE | Admit: 2011-01-24 | Discharge: 2011-01-24 | Disposition: A | Discharge status: Home / Self Care | Payer: BC Managed Care – PPO | Source: Ambulatory Visit | Attending: Family Medicine | Admitting: Family Medicine

## 2011-01-24 ENCOUNTER — Ambulatory Visit (HOSPITAL_COMMUNITY)
Admission: RE | Admit: 2011-01-24 | Discharge: 2011-01-24 | Disposition: A | Discharge status: Home / Self Care | Payer: BC Managed Care – PPO | Source: Ambulatory Visit | Admitting: Specialist

## 2011-01-27 LAB — GLUCOSE, CAPILLARY
Glucose-Capillary: 103 mg/dL — ABNORMAL HIGH (ref 70–99)
Glucose-Capillary: 106 mg/dL — ABNORMAL HIGH (ref 70–99)
Glucose-Capillary: 108 mg/dL — ABNORMAL HIGH (ref 70–99)
Glucose-Capillary: 115 mg/dL — ABNORMAL HIGH (ref 70–99)
Glucose-Capillary: 115 mg/dL — ABNORMAL HIGH (ref 70–99)
Glucose-Capillary: 119 mg/dL — ABNORMAL HIGH (ref 70–99)
Glucose-Capillary: 123 mg/dL — ABNORMAL HIGH (ref 70–99)
Glucose-Capillary: 128 mg/dL — ABNORMAL HIGH (ref 70–99)
Glucose-Capillary: 130 mg/dL — ABNORMAL HIGH (ref 70–99)
Glucose-Capillary: 136 mg/dL — ABNORMAL HIGH (ref 70–99)
Glucose-Capillary: 138 mg/dL — ABNORMAL HIGH (ref 70–99)
Glucose-Capillary: 143 mg/dL — ABNORMAL HIGH (ref 70–99)
Glucose-Capillary: 144 mg/dL — ABNORMAL HIGH (ref 70–99)
Glucose-Capillary: 153 mg/dL — ABNORMAL HIGH (ref 70–99)
Glucose-Capillary: 161 mg/dL — ABNORMAL HIGH (ref 70–99)
Glucose-Capillary: 198 mg/dL — ABNORMAL HIGH (ref 70–99)
Glucose-Capillary: 98 mg/dL (ref 70–99)
Glucose-Capillary: 110 mg/dL — ABNORMAL HIGH (ref 70–99)
Glucose-Capillary: 111 mg/dL — ABNORMAL HIGH (ref 70–99)
Glucose-Capillary: 111 mg/dL — ABNORMAL HIGH (ref 70–99)
Glucose-Capillary: 111 mg/dL — ABNORMAL HIGH (ref 70–99)
Glucose-Capillary: 112 mg/dL — ABNORMAL HIGH (ref 70–99)
Glucose-Capillary: 115 mg/dL — ABNORMAL HIGH (ref 70–99)
Glucose-Capillary: 117 mg/dL — ABNORMAL HIGH (ref 70–99)
Glucose-Capillary: 119 mg/dL — ABNORMAL HIGH (ref 70–99)
Glucose-Capillary: 120 mg/dL — ABNORMAL HIGH (ref 70–99)
Glucose-Capillary: 120 mg/dL — ABNORMAL HIGH (ref 70–99)
Glucose-Capillary: 121 mg/dL — ABNORMAL HIGH (ref 70–99)
Glucose-Capillary: 126 mg/dL — ABNORMAL HIGH (ref 70–99)
Glucose-Capillary: 129 mg/dL — ABNORMAL HIGH (ref 70–99)
Glucose-Capillary: 129 mg/dL — ABNORMAL HIGH (ref 70–99)
Glucose-Capillary: 129 mg/dL — ABNORMAL HIGH (ref 70–99)
Glucose-Capillary: 129 mg/dL — ABNORMAL HIGH (ref 70–99)
Glucose-Capillary: 131 mg/dL — ABNORMAL HIGH (ref 70–99)
Glucose-Capillary: 131 mg/dL — ABNORMAL HIGH (ref 70–99)
Glucose-Capillary: 134 mg/dL — ABNORMAL HIGH (ref 70–99)
Glucose-Capillary: 146 mg/dL — ABNORMAL HIGH (ref 70–99)
Glucose-Capillary: 152 mg/dL — ABNORMAL HIGH (ref 70–99)
Glucose-Capillary: 152 mg/dL — ABNORMAL HIGH (ref 70–99)
Glucose-Capillary: 161 mg/dL — ABNORMAL HIGH (ref 70–99)
Glucose-Capillary: 182 mg/dL — ABNORMAL HIGH (ref 70–99)
Glucose-Capillary: 204 mg/dL — ABNORMAL HIGH (ref 70–99)
Glucose-Capillary: 317 mg/dL — ABNORMAL HIGH (ref 70–99)

## 2011-01-27 LAB — COMPREHENSIVE METABOLIC PANEL
ALT: 24 U/L (ref 0–53)
AST: 26 U/L (ref 0–37)
Alkaline Phosphatase: 45 U/L (ref 39–117)
CO2: 26 mEq/L (ref 19–32)
Calcium: 9.1 mg/dL (ref 8.4–10.5)
GFR calc Af Amer: >60 mL/min (ref >60)
Glucose, Bld: 96 mg/dL (ref 70–99)
Potassium: 3.3 mEq/L — ABNORMAL LOW (ref 3.5–5.1)
Sodium: 136 mEq/L (ref 135–145)
Total Protein: 7.2 g/dL (ref 6.0–8.3)

## 2011-01-27 LAB — CBC
HCT: 44 % (ref 39.0–52.0)
Hemoglobin: 15.6 g/dL (ref 13.0–17.0)
MCHC: 35.5 g/dL (ref 30.0–36.0)
RDW: 13.4 % (ref 11.5–15.5)
WBC: 11.6 10*3/uL — ABNORMAL HIGH (ref 4.0–10.5)

## 2011-01-27 LAB — DIFFERENTIAL
Basophils Relative: 0 % (ref 0–1)
Eosinophils Absolute: 0.3 10*3/uL (ref 0.0–0.7)
Eosinophils Relative: 3 % (ref 0–5)
Lymphs Abs: 2 10*3/uL (ref 0.7–4.0)
Monocytes Relative: 12 % (ref 3–12)

## 2011-01-27 LAB — BASIC METABOLIC PANEL
BUN: 13 mg/dL (ref 6–23)
BUN: 14 mg/dL (ref 6–23)
Calcium: 9.6 mg/dL (ref 8.4–10.5)
Chloride: 102 mEq/L (ref 96–112)
GFR calc non Af Amer: >60 mL/min (ref >60)
Glucose, Bld: 113 mg/dL — ABNORMAL HIGH (ref 70–99)
Glucose, Bld: 111 mg/dL — ABNORMAL HIGH (ref 70–99)
Potassium: 4.2 mEq/L (ref 3.5–5.1)
Sodium: 138 mEq/L (ref 135–145)

## 2011-01-27 LAB — URINALYSIS, ROUTINE W REFLEX MICROSCOPIC
Ketones, ur: 15 mg/dL — AB
Nitrite: NEGATIVE
Protein, ur: NEGATIVE mg/dL
Urobilinogen, UA: 1 mg/dL (ref 0.0–1.0)
pH: 5.5 (ref 5.0–8.0)

## 2011-01-27 LAB — SODIUM
Sodium: 140 mEq/L (ref 135–145)
Sodium: 142 mEq/L (ref 135–145)
Sodium: 143 mEq/L (ref 135–145)

## 2011-01-27 LAB — URINE CULTURE
Colony Count: >=100000
Culture  Setup Time: 201109062031

## 2011-01-27 LAB — URINE MICROSCOPIC-ADD ON

## 2011-01-27 LAB — OSMOLALITY
Osmolality: 283 mOsm/kg (ref 275–300)
Osmolality: 294 mOsm/kg (ref 275–300)

## 2011-01-28 ENCOUNTER — Ambulatory Visit (HOSPITAL_COMMUNITY)
Admission: RE | Admit: 2011-01-28 | Discharge: 2011-01-28 | Disposition: A | Discharge status: Home / Self Care | Payer: BC Managed Care – PPO | Source: Ambulatory Visit | Attending: *Deleted | Admitting: *Deleted

## 2011-01-28 LAB — OSMOLALITY
Osmolality: 289 mOsm/kg (ref 275–300)
Osmolality: 290 mOsm/kg (ref 275–300)
Osmolality: 294 mOsm/kg (ref 275–300)
Osmolality: 297 mOsm/kg (ref 275–300)

## 2011-01-28 LAB — CBC
HCT: 40.2 % (ref 39.0–52.0)
HCT: 41.6 % (ref 39.0–52.0)
Hemoglobin: 14.2 g/dL (ref 13.0–17.0)
Hemoglobin: 15.6 g/dL (ref 13.0–17.0)
MCH: 30.1 pg (ref 26.0–34.0)
MCH: 31.3 pg (ref 26.0–34.0)
MCHC: 34.9 g/dL (ref 30.0–36.0)
MCV: 84.9 fL (ref 78.0–100.0)
MCV: 85.7 fL (ref 78.0–100.0)
MCV: 87.7 fL (ref 78.0–100.0)
Platelets: 189 10*3/uL (ref 150–400)
RBC: 4.69 MIL/uL (ref 4.22–5.81)
RBC: 4.9 MIL/uL (ref 4.22–5.81)
RBC: 4.98 MIL/uL (ref 4.22–5.81)
RDW: 12.7 % (ref 11.5–15.5)
WBC: 10.3 10*3/uL (ref 4.0–10.5)
WBC: 10.8 10*3/uL — ABNORMAL HIGH (ref 4.0–10.5)
WBC: 14.3 10*3/uL — ABNORMAL HIGH (ref 4.0–10.5)

## 2011-01-28 LAB — GLUCOSE, CAPILLARY
Glucose-Capillary: 102 mg/dL — ABNORMAL HIGH (ref 70–99)
Glucose-Capillary: 107 mg/dL — ABNORMAL HIGH (ref 70–99)
Glucose-Capillary: 112 mg/dL — ABNORMAL HIGH (ref 70–99)
Glucose-Capillary: 115 mg/dL — ABNORMAL HIGH (ref 70–99)
Glucose-Capillary: 115 mg/dL — ABNORMAL HIGH (ref 70–99)
Glucose-Capillary: 121 mg/dL — ABNORMAL HIGH (ref 70–99)
Glucose-Capillary: 129 mg/dL — ABNORMAL HIGH (ref 70–99)
Glucose-Capillary: 146 mg/dL — ABNORMAL HIGH (ref 70–99)
Glucose-Capillary: 153 mg/dL — ABNORMAL HIGH (ref 70–99)
Glucose-Capillary: 156 mg/dL — ABNORMAL HIGH (ref 70–99)

## 2011-01-28 LAB — SODIUM
Sodium: 140 mEq/L (ref 135–145)
Sodium: 143 mEq/L (ref 135–145)
Sodium: 144 mEq/L (ref 135–145)
Sodium: 144 mEq/L (ref 135–145)
Sodium: 145 mEq/L (ref 135–145)

## 2011-01-28 LAB — COMPREHENSIVE METABOLIC PANEL
Alkaline Phosphatase: 33 U/L — ABNORMAL LOW (ref 39–117)
BUN: 10 mg/dL (ref 6–23)
CO2: 24 mEq/L (ref 19–32)
Creatinine, Ser: 0.91 mg/dL (ref 0.4–1.5)
GFR calc Af Amer: >60 mL/min (ref >60)
Glucose, Bld: 118 mg/dL — ABNORMAL HIGH (ref 70–99)
Sodium: 138 mEq/L (ref 135–145)
Total Bilirubin: 0.5 mg/dL (ref 0.3–1.2)

## 2011-01-28 LAB — URINALYSIS, ROUTINE W REFLEX MICROSCOPIC
Bilirubin Urine: NEGATIVE
Glucose, UA: NEGATIVE mg/dL
Hgb urine dipstick: NEGATIVE
Protein, ur: NEGATIVE mg/dL

## 2011-01-28 LAB — DIFFERENTIAL
Basophils Absolute: 0 10*3/uL (ref 0.0–0.1)
Basophils Absolute: 0 10*3/uL (ref 0.0–0.1)
Basophils Relative: 0 % (ref 0–1)
Basophils Relative: 1 % (ref 0–1)
Eosinophils Relative: 0 % (ref 0–5)
Lymphocytes Relative: 13 % (ref 12–46)
Lymphocytes Relative: 26 % (ref 12–46)
Lymphs Abs: 1.9 10*3/uL (ref 0.7–4.0)
Monocytes Absolute: 1 10*3/uL (ref 0.1–1.0)
Monocytes Relative: 7 % (ref 3–12)
Monocytes Relative: 7 % (ref 3–12)
Neutro Abs: 5.3 10*3/uL (ref 1.7–7.7)
Neutro Abs: 8.6 10*3/uL — ABNORMAL HIGH (ref 1.7–7.7)
Neutrophils Relative %: 64 % (ref 43–77)
Neutrophils Relative %: 75 % (ref 43–77)

## 2011-01-28 LAB — BASIC METABOLIC PANEL
CO2: 25 mEq/L (ref 19–32)
Chloride: 106 mEq/L (ref 96–112)
Chloride: 107 mEq/L (ref 96–112)
GFR calc Af Amer: >60 mL/min (ref >60)
GFR calc Af Amer: >60 mL/min (ref >60)
Potassium: 3.3 mEq/L — ABNORMAL LOW (ref 3.5–5.1)
Potassium: 3.5 mEq/L (ref 3.5–5.1)
Sodium: 140 mEq/L (ref 135–145)

## 2011-01-28 LAB — LIPID PANEL
HDL: 31 mg/dL — ABNORMAL LOW (ref >39)
Total CHOL/HDL Ratio: 6.2 RATIO
VLDL: 35 mg/dL (ref 0–40)

## 2011-01-28 LAB — HEMOGLOBIN A1C
Hgb A1c MFr Bld: 6.2 % — ABNORMAL HIGH (ref <5.7)
Mean Plasma Glucose: 131 mg/dL — ABNORMAL HIGH (ref <117)

## 2011-01-28 LAB — PROTIME-INR
INR: 1.02 (ref 0.00–1.49)
INR: 1.14 (ref 0.00–1.49)

## 2011-01-28 LAB — APTT: aPTT: 27 seconds (ref 24–37)

## 2011-01-28 LAB — TROPONIN I: Troponin I: 0.04 ng/mL (ref 0.00–0.06)

## 2011-01-28 LAB — CK TOTAL AND CKMB (NOT AT ARMC): Relative Index: INVALID (ref 0.0–2.5)

## 2011-01-28 LAB — MRSA PCR SCREENING: MRSA by PCR: NEGATIVE

## 2011-01-31 ENCOUNTER — Ambulatory Visit (HOSPITAL_COMMUNITY)
Admission: RE | Admit: 2011-01-31 | Discharge: 2011-01-31 | Disposition: A | Discharge status: Home / Self Care | Payer: BC Managed Care – PPO | Source: Ambulatory Visit | Attending: *Deleted | Admitting: *Deleted

## 2011-02-04 ENCOUNTER — Ambulatory Visit (HOSPITAL_COMMUNITY)
Admission: RE | Admit: 2011-02-04 | Discharge: 2011-02-04 | Disposition: A | Discharge status: Home / Self Care | Payer: BC Managed Care – PPO | Source: Ambulatory Visit

## 2011-02-07 ENCOUNTER — Ambulatory Visit (HOSPITAL_COMMUNITY)
Admission: RE | Admit: 2011-02-07 | Discharge: 2011-02-07 | Disposition: A | Discharge status: Home / Self Care | Payer: BC Managed Care – PPO | Source: Ambulatory Visit | Attending: *Deleted | Admitting: *Deleted

## 2011-02-09 ENCOUNTER — Ambulatory Visit (HOSPITAL_COMMUNITY)
Admission: RE | Admit: 2011-02-09 | Discharge: 2011-02-09 | Disposition: A | Discharge status: Home / Self Care | Payer: BC Managed Care – PPO | Source: Ambulatory Visit | Attending: *Deleted | Admitting: *Deleted

## 2011-02-09 ENCOUNTER — Ambulatory Visit (HOSPITAL_COMMUNITY): Payer: BC Managed Care – PPO

## 2011-02-14 ENCOUNTER — Ambulatory Visit (HOSPITAL_COMMUNITY): Payer: BC Managed Care – PPO | Admitting: Specialist

## 2011-02-14 ENCOUNTER — Ambulatory Visit (HOSPITAL_COMMUNITY)
Admission: RE | Admit: 2011-02-14 | Discharge: 2011-02-14 | Disposition: A | Discharge status: Home / Self Care | Payer: BC Managed Care – PPO | Source: Ambulatory Visit | Attending: Family Medicine | Admitting: Family Medicine

## 2011-02-14 DIAGNOSIS — I69959 Hemiplegia and hemiparesis following unspecified cerebrovascular disease affecting unspecified side: Secondary | ICD-10-CM | POA: Insufficient documentation

## 2011-02-14 DIAGNOSIS — Z5189 Encounter for other specified aftercare: Secondary | ICD-10-CM | POA: Insufficient documentation

## 2011-02-14 DIAGNOSIS — M6281 Muscle weakness (generalized): Secondary | ICD-10-CM | POA: Insufficient documentation

## 2011-02-14 DIAGNOSIS — R279 Unspecified lack of coordination: Secondary | ICD-10-CM | POA: Insufficient documentation

## 2011-02-14 DIAGNOSIS — R262 Difficulty in walking, not elsewhere classified: Secondary | ICD-10-CM | POA: Insufficient documentation

## 2011-02-21 ENCOUNTER — Ambulatory Visit (HOSPITAL_COMMUNITY)
Admission: RE | Admit: 2011-02-21 | Discharge: 2011-02-21 | Disposition: A | Discharge status: Home / Self Care | Payer: BC Managed Care – PPO | Source: Ambulatory Visit | Attending: *Deleted | Admitting: *Deleted

## 2011-02-21 ENCOUNTER — Ambulatory Visit (HOSPITAL_COMMUNITY): Payer: BC Managed Care – PPO | Admitting: Occupational Therapy

## 2011-02-25 ENCOUNTER — Ambulatory Visit (HOSPITAL_COMMUNITY)
Admission: RE | Admit: 2011-02-25 | Discharge: 2011-02-25 | Disposition: A | Discharge status: Home / Self Care | Payer: BC Managed Care – PPO | Source: Ambulatory Visit | Attending: Family Medicine | Admitting: Family Medicine

## 2011-02-25 ENCOUNTER — Ambulatory Visit (HOSPITAL_COMMUNITY): Payer: BC Managed Care – PPO | Admitting: Physical Therapy

## 2011-02-25 DIAGNOSIS — R262 Difficulty in walking, not elsewhere classified: Secondary | ICD-10-CM | POA: Insufficient documentation

## 2011-02-25 DIAGNOSIS — M6281 Muscle weakness (generalized): Secondary | ICD-10-CM | POA: Insufficient documentation

## 2011-02-25 DIAGNOSIS — I69959 Hemiplegia and hemiparesis following unspecified cerebrovascular disease affecting unspecified side: Secondary | ICD-10-CM | POA: Insufficient documentation

## 2011-02-25 DIAGNOSIS — R279 Unspecified lack of coordination: Secondary | ICD-10-CM | POA: Insufficient documentation

## 2011-02-25 DIAGNOSIS — Z5189 Encounter for other specified aftercare: Secondary | ICD-10-CM | POA: Insufficient documentation

## 2011-02-28 ENCOUNTER — Ambulatory Visit (HOSPITAL_COMMUNITY): Payer: BC Managed Care – PPO | Admitting: Occupational Therapy

## 2011-03-04 ENCOUNTER — Ambulatory Visit (HOSPITAL_COMMUNITY): Payer: BC Managed Care – PPO | Admitting: Occupational Therapy

## 2011-03-07 ENCOUNTER — Ambulatory Visit (HOSPITAL_COMMUNITY): Payer: BC Managed Care – PPO | Admitting: Specialist

## 2011-03-11 ENCOUNTER — Ambulatory Visit (HOSPITAL_COMMUNITY): Payer: BC Managed Care – PPO | Admitting: Specialist

## 2011-04-08 ENCOUNTER — Ambulatory Visit (HOSPITAL_COMMUNITY)
Admission: RE | Admit: 2011-04-08 | Discharge: 2011-04-08 | Disposition: A | Discharge status: Home / Self Care | Payer: BC Managed Care – PPO | Source: Ambulatory Visit | Attending: Neurology | Admitting: Neurology

## 2011-04-08 DIAGNOSIS — IMO0001 Reserved for inherently not codable concepts without codable children: Secondary | ICD-10-CM | POA: Insufficient documentation

## 2011-04-08 DIAGNOSIS — I10 Essential (primary) hypertension: Secondary | ICD-10-CM | POA: Insufficient documentation

## 2011-04-08 DIAGNOSIS — M6281 Muscle weakness (generalized): Secondary | ICD-10-CM | POA: Insufficient documentation

## 2011-04-08 DIAGNOSIS — E119 Type 2 diabetes mellitus without complications: Secondary | ICD-10-CM | POA: Insufficient documentation

## 2011-04-08 DIAGNOSIS — Z951 Presence of aortocoronary bypass graft: Secondary | ICD-10-CM | POA: Insufficient documentation

## 2011-04-08 DIAGNOSIS — R269 Unspecified abnormalities of gait and mobility: Secondary | ICD-10-CM | POA: Insufficient documentation

## 2011-04-14 ENCOUNTER — Ambulatory Visit (HOSPITAL_COMMUNITY)
Admission: RE | Admit: 2011-04-14 | Discharge: 2011-04-14 | Disposition: A | Discharge status: Home / Self Care | Payer: BC Managed Care – PPO | Source: Ambulatory Visit | Attending: Neurology | Admitting: Neurology

## 2011-04-18 ENCOUNTER — Ambulatory Visit (HOSPITAL_COMMUNITY)
Admission: RE | Admit: 2011-04-18 | Discharge: 2011-04-18 | Disposition: A | Discharge status: Home / Self Care | Payer: BC Managed Care – PPO | Source: Ambulatory Visit | Attending: Family Medicine | Admitting: Family Medicine

## 2011-04-18 DIAGNOSIS — R262 Difficulty in walking, not elsewhere classified: Secondary | ICD-10-CM | POA: Insufficient documentation

## 2011-04-18 DIAGNOSIS — R279 Unspecified lack of coordination: Secondary | ICD-10-CM | POA: Insufficient documentation

## 2011-04-18 DIAGNOSIS — M6281 Muscle weakness (generalized): Secondary | ICD-10-CM | POA: Insufficient documentation

## 2011-04-18 DIAGNOSIS — Z5189 Encounter for other specified aftercare: Secondary | ICD-10-CM | POA: Insufficient documentation

## 2011-04-18 DIAGNOSIS — I69959 Hemiplegia and hemiparesis following unspecified cerebrovascular disease affecting unspecified side: Secondary | ICD-10-CM | POA: Insufficient documentation

## 2011-04-22 ENCOUNTER — Ambulatory Visit (HOSPITAL_COMMUNITY)
Admission: RE | Admit: 2011-04-22 | Discharge: 2011-04-22 | Disposition: A | Discharge status: Home / Self Care | Payer: BC Managed Care – PPO | Source: Ambulatory Visit | Attending: Neurology | Admitting: Neurology

## 2011-04-22 ENCOUNTER — Ambulatory Visit (HOSPITAL_COMMUNITY)
Admission: RE | Admit: 2011-04-22 | Discharge: 2011-04-22 | Disposition: A | Discharge status: Home / Self Care | Payer: BC Managed Care – PPO | Source: Ambulatory Visit | Attending: Family Medicine | Admitting: Family Medicine

## 2011-04-27 ENCOUNTER — Inpatient Hospital Stay (HOSPITAL_COMMUNITY)
Admission: RE | Admit: 2011-04-27 | Discharge: 2011-04-27 | Disposition: A | Discharge status: Home / Self Care | Payer: BC Managed Care – PPO | Source: Ambulatory Visit | Attending: Physical Therapy | Admitting: Physical Therapy

## 2011-04-29 ENCOUNTER — Ambulatory Visit (HOSPITAL_COMMUNITY)
Admission: RE | Admit: 2011-04-29 | Discharge: 2011-04-29 | Disposition: A | Discharge status: Home / Self Care | Payer: BC Managed Care – PPO | Source: Ambulatory Visit | Attending: Family Medicine | Admitting: Family Medicine

## 2011-05-02 ENCOUNTER — Ambulatory Visit (HOSPITAL_COMMUNITY)
Admission: RE | Admit: 2011-05-02 | Discharge: 2011-05-02 | Disposition: A | Discharge status: Home / Self Care | Payer: BC Managed Care – PPO | Source: Ambulatory Visit | Attending: Family Medicine | Admitting: Family Medicine

## 2011-05-06 ENCOUNTER — Ambulatory Visit (HOSPITAL_COMMUNITY)
Admission: RE | Admit: 2011-05-06 | Discharge: 2011-05-06 | Disposition: A | Discharge status: Home / Self Care | Payer: BC Managed Care – PPO | Source: Ambulatory Visit | Attending: Family Medicine | Admitting: Family Medicine

## 2011-05-11 ENCOUNTER — Ambulatory Visit (HOSPITAL_COMMUNITY): Payer: BC Managed Care – PPO | Admitting: Occupational Therapy

## 2011-05-11 ENCOUNTER — Ambulatory Visit (HOSPITAL_COMMUNITY): Payer: BC Managed Care – PPO

## 2011-05-13 ENCOUNTER — Ambulatory Visit (HOSPITAL_COMMUNITY)
Admission: RE | Admit: 2011-05-13 | Discharge: 2011-05-13 | Disposition: A | Discharge status: Home / Self Care | Payer: BC Managed Care – PPO | Source: Ambulatory Visit | Attending: Family Medicine | Admitting: Family Medicine

## 2011-05-13 ENCOUNTER — Ambulatory Visit (HOSPITAL_COMMUNITY): Payer: BC Managed Care – PPO | Admitting: Physical Therapy

## 2011-07-08 ENCOUNTER — Ambulatory Visit: Payer: BC Managed Care – PPO | Admitting: Physical Medicine & Rehabilitation

## 2011-07-08 ENCOUNTER — Encounter: Payer: BC Managed Care – PPO | Attending: Physical Medicine & Rehabilitation

## 2011-07-08 DIAGNOSIS — I6330 Cerebral infarction due to thrombosis of unspecified cerebral artery: Secondary | ICD-10-CM

## 2011-07-08 DIAGNOSIS — M25529 Pain in unspecified elbow: Secondary | ICD-10-CM

## 2011-07-08 DIAGNOSIS — S43006A Unspecified dislocation of unspecified shoulder joint, initial encounter: Secondary | ICD-10-CM | POA: Insufficient documentation

## 2011-07-08 DIAGNOSIS — F329 Major depressive disorder, single episode, unspecified: Secondary | ICD-10-CM | POA: Insufficient documentation

## 2011-07-08 DIAGNOSIS — X58XXXA Exposure to other specified factors, initial encounter: Secondary | ICD-10-CM | POA: Insufficient documentation

## 2011-07-08 DIAGNOSIS — I69959 Hemiplegia and hemiparesis following unspecified cerebrovascular disease affecting unspecified side: Secondary | ICD-10-CM | POA: Insufficient documentation

## 2011-07-08 DIAGNOSIS — F3289 Other specified depressive episodes: Secondary | ICD-10-CM | POA: Insufficient documentation

## 2011-07-08 DIAGNOSIS — G479 Sleep disorder, unspecified: Secondary | ICD-10-CM | POA: Insufficient documentation

## 2011-07-08 DIAGNOSIS — M62838 Other muscle spasm: Secondary | ICD-10-CM | POA: Insufficient documentation

## 2011-07-08 DIAGNOSIS — G8110 Spastic hemiplegia affecting unspecified side: Secondary | ICD-10-CM

## 2011-07-09 NOTE — Assessment & Plan Note (Signed)
A 55 year old male with right MCA CVA causing left hemiplegia.  He has had really no recovery in his left upper extremity, has continued left shoulder pain.  He does have a partial tear of rotator cuff, diagnosed on ultrasound.  He does use a cuff type of sling.  His gait is with AFO and a cane.  He has had no falls.  He did not follow up with me for quite some time and for that reason, we did not refill his Remeron.  He feels like he does not sleep as well and he feels more depressed now. He is here to reestablish care.  His wife is here with him today.  Onset of CVA July 08, 2010.  His carotid stenosis right ICA treated medically.  Rehab unit from September 2 to August 06, 2010.  He had a supervision level using a single point cane, household distances.  Motor strength has 2- strength in the biceps, but otherwise 0.  Finger flexor and wrist flexor spasticity is graded as 2.  Lower extremity strength 3- hip flexor and knee extensor.  His left shoulder has evidence of subluxation which reduces with upward pressure on his elbow.  IMPRESSION: 1. Right middle cerebral artery distribution infarct with left     hemiparesis. 2. Left shoulder subluxation and pain as well as partial rotator cuff     tear.  Trialed FES. 3. His spasticity appears to be improved.  No need for anti-spasticity     medications. 4. Post-stroke depression and sleep disturbance.  We will reinstitute     Remeron 30 mg per day.  Other medications include Plavix and Lipitor.  See him back in 1 month's time.  Assess efficacy of FES.     Erick Colace, M.D. Electronically Signed    AEK/MedQ D:  07/08/2011 16:18:14  T:  07/09/2011 00:07:58  Job #:  [id number]  cc:   Pramod P. Pearlean Brownie, MD Fax: [phone]

## 2011-07-28 ENCOUNTER — Ambulatory Visit: Payer: BC Managed Care – PPO | Attending: Physical Medicine & Rehabilitation | Admitting: Occupational Therapy

## 2011-07-28 DIAGNOSIS — IMO0001 Reserved for inherently not codable concepts without codable children: Secondary | ICD-10-CM | POA: Insufficient documentation

## 2011-07-28 DIAGNOSIS — M2420 Disorder of ligament, unspecified site: Secondary | ICD-10-CM | POA: Insufficient documentation

## 2011-07-28 DIAGNOSIS — M629 Disorder of muscle, unspecified: Secondary | ICD-10-CM | POA: Insufficient documentation

## 2011-07-28 DIAGNOSIS — M25519 Pain in unspecified shoulder: Secondary | ICD-10-CM | POA: Insufficient documentation

## 2011-08-04 ENCOUNTER — Ambulatory Visit: Payer: BC Managed Care – PPO | Admitting: Occupational Therapy

## 2011-08-05 ENCOUNTER — Ambulatory Visit: Payer: BC Managed Care – PPO | Admitting: Physical Medicine & Rehabilitation

## 2011-08-09 ENCOUNTER — Ambulatory Visit: Payer: BC Managed Care – PPO | Admitting: Occupational Therapy

## 2011-08-18 ENCOUNTER — Ambulatory Visit: Payer: BC Managed Care – PPO | Attending: Physical Medicine & Rehabilitation | Admitting: Occupational Therapy

## 2011-08-18 DIAGNOSIS — M25519 Pain in unspecified shoulder: Secondary | ICD-10-CM | POA: Insufficient documentation

## 2011-08-18 DIAGNOSIS — M629 Disorder of muscle, unspecified: Secondary | ICD-10-CM | POA: Insufficient documentation

## 2011-08-18 DIAGNOSIS — M2420 Disorder of ligament, unspecified site: Secondary | ICD-10-CM | POA: Insufficient documentation

## 2011-08-18 DIAGNOSIS — IMO0001 Reserved for inherently not codable concepts without codable children: Secondary | ICD-10-CM | POA: Insufficient documentation

## 2011-08-25 ENCOUNTER — Encounter: Payer: BC Managed Care – PPO | Admitting: Occupational Therapy

## 2011-09-09 ENCOUNTER — Encounter: Payer: BC Managed Care – PPO | Attending: Physical Medicine & Rehabilitation

## 2011-09-09 ENCOUNTER — Ambulatory Visit: Payer: BC Managed Care – PPO | Admitting: Physical Medicine & Rehabilitation

## 2011-09-09 DIAGNOSIS — M545 Low back pain, unspecified: Secondary | ICD-10-CM | POA: Insufficient documentation

## 2011-09-09 DIAGNOSIS — I69959 Hemiplegia and hemiparesis following unspecified cerebrovascular disease affecting unspecified side: Secondary | ICD-10-CM | POA: Insufficient documentation

## 2011-09-09 DIAGNOSIS — G479 Sleep disorder, unspecified: Secondary | ICD-10-CM | POA: Insufficient documentation

## 2011-09-09 DIAGNOSIS — F3289 Other specified depressive episodes: Secondary | ICD-10-CM | POA: Insufficient documentation

## 2011-09-09 DIAGNOSIS — M79609 Pain in unspecified limb: Secondary | ICD-10-CM

## 2011-09-09 DIAGNOSIS — R259 Unspecified abnormal involuntary movements: Secondary | ICD-10-CM | POA: Insufficient documentation

## 2011-09-09 DIAGNOSIS — F329 Major depressive disorder, single episode, unspecified: Secondary | ICD-10-CM | POA: Insufficient documentation

## 2011-09-09 DIAGNOSIS — I69998 Other sequelae following unspecified cerebrovascular disease: Secondary | ICD-10-CM | POA: Insufficient documentation

## 2011-09-09 DIAGNOSIS — G8110 Spastic hemiplegia affecting unspecified side: Secondary | ICD-10-CM

## 2011-09-09 DIAGNOSIS — X58XXXA Exposure to other specified factors, initial encounter: Secondary | ICD-10-CM | POA: Insufficient documentation

## 2011-09-09 NOTE — Assessment & Plan Note (Signed)
REASON FOR VISIT:  New complaint of back pain going to the left buttock and left lower extremity.  Prior complaints of left spastic hemiparesis due to CVA.  HISTORY:  A 55 year old male who had a right MCA distribution infarct causing left hemiplegia.  He has had no upper extremity return.  He has had left shoulder pain which has been improved with functional electrical stimulation.  He also has improvements of headaches, one of his patch that he puts over the trapezius and 1 over the deltoid.  New complaint of back pain.  He states that he actually had it before his stroke, but now he is walking and this is seeming to do aggravate it.  He has difficulty telling whether he has numbness because he does have numbness related to stroke, he also has some weakness related to stroke, so he is has difficulty telling whether this has worsened.  He also complains of pain going down all the way to the foot.  He has had previous MRI, seen by an orthopedist in [place].  He will try to get me a copy of this.  His average pain 6-7/10.  Interferes activity at a 3/10 level.  Pain is described as sharp, dull, aching.  Walking tolerance is 45 minutes.  He can climb steps.  He does not drive.  He needs help with dressing, bathing, meal prep, household duties, and shopping, but independent with feeding and toileting.  REVIEW OF SYSTEMS:  Positive for spasms.  Positive for constipation.  SOCIAL HISTORY:  He is married, lives with his wife in [place].  PHYSICAL EXAMINATION:  VITAL SIGNS:  Blood pressure 124/79, pulse 67, respirations 16, and O2 saturation 98% on room air. GENERAL:  No acute distress.  Mood and affect appropriate. BACK:  He has tenderness to palpation on the left PSIS, straight leg raising test is causing some hamstring tightness, this is actually bilaterally.  His motor strength is unchanged at 3- at the hip flexor, knee extensor, he has a left AFO with chronic foot drop due to  the stroke.  He has subluxation left shoulder, no pain with shoulder range of motion, however.  IMPRESSION: 1. Right middle cerebral artery distribution infarct and left     hemiparesis. 2. Low back pain, left lower extremity pain question whether this is a     radiculopathy, I will take a look at his prior MRI. 3. Spasticity, no need for spasticity medicines. 4. Poststroke depression.  Sleep disturbance improved on Remeron,     started last visit.  PLAN: 1. Continue FBS. 2. Continue current medications.  His other medications will be     determined by his primary physician as well as     Neurology, these include Keppra 500 b.i.d.  Plavix 75 daily,     losartan 25 daily, Lipitor 40 daily.  I will see him back in 1     month to look over the MRI.     Erick Colace, M.D. Electronically Signed    AEK/MedQ D:  09/09/2011 10:40:52  T:  09/09/2011 11:25:10  Job #:  [id number]  cc:   Pramod P. Pearlean Brownie, MD Fax: [phone]  Dr. Ned Card

## 2011-10-14 ENCOUNTER — Ambulatory Visit: Payer: BC Managed Care – PPO | Admitting: Physical Medicine & Rehabilitation

## 2012-01-03 ENCOUNTER — Other Ambulatory Visit: Payer: Self-pay | Admitting: *Deleted

## 2012-01-03 MED ORDER — MIRTAZAPINE 30 MG PO TABS: 30.0000 mg | ORAL_TABLET | Freq: Every day | ORAL | Status: DC

## 2012-02-15 ENCOUNTER — Telehealth: Payer: Self-pay | Admitting: *Deleted

## 2012-02-15 NOTE — Telephone Encounter (Signed)
Fax request for refill on tizanidine 2mg  q HS #[phone]. Last office visit Jan 2012, no scheduled app't , one cx app't in Nov. 2012. OK to refill?

## 2012-02-16 ENCOUNTER — Other Ambulatory Visit: Payer: Self-pay | Admitting: Physical Medicine & Rehabilitation

## 2012-02-20 NOTE — Telephone Encounter (Signed)
Needs another appointment prior to any more prescriptions.

## 2012-02-21 ENCOUNTER — Telehealth: Payer: Self-pay | Admitting: *Deleted

## 2012-02-21 NOTE — Telephone Encounter (Signed)
Verbal w/ wife. Needs app't for any further Rx refills. Will schedule.

## 2012-03-23 ENCOUNTER — Encounter: Payer: Self-pay | Admitting: Physical Medicine & Rehabilitation

## 2012-03-23 ENCOUNTER — Ambulatory Visit (HOSPITAL_BASED_OUTPATIENT_CLINIC_OR_DEPARTMENT_OTHER): Payer: BC Managed Care – PPO | Admitting: Physical Medicine & Rehabilitation

## 2012-03-23 ENCOUNTER — Encounter: Payer: BC Managed Care – PPO | Attending: Physical Medicine & Rehabilitation

## 2012-03-23 VITALS — BP 110/69 | HR 76 | Resp 16 | Ht 69.0 in | Wt 210.0 lb

## 2012-03-23 DIAGNOSIS — G8110 Spastic hemiplegia affecting unspecified side: Secondary | ICD-10-CM

## 2012-03-23 DIAGNOSIS — R5383 Other fatigue: Secondary | ICD-10-CM | POA: Insufficient documentation

## 2012-03-23 DIAGNOSIS — M7520 Bicipital tendinitis, unspecified shoulder: Secondary | ICD-10-CM

## 2012-03-23 DIAGNOSIS — F411 Generalized anxiety disorder: Secondary | ICD-10-CM | POA: Insufficient documentation

## 2012-03-23 DIAGNOSIS — R42 Dizziness and giddiness: Secondary | ICD-10-CM | POA: Insufficient documentation

## 2012-03-23 DIAGNOSIS — R269 Unspecified abnormalities of gait and mobility: Secondary | ICD-10-CM | POA: Insufficient documentation

## 2012-03-23 DIAGNOSIS — R5381 Other malaise: Secondary | ICD-10-CM | POA: Insufficient documentation

## 2012-03-23 DIAGNOSIS — G8114 Spastic hemiplegia affecting left nondominant side: Secondary | ICD-10-CM | POA: Insufficient documentation

## 2012-03-23 DIAGNOSIS — R209 Unspecified disturbances of skin sensation: Secondary | ICD-10-CM | POA: Insufficient documentation

## 2012-03-23 MED ORDER — TRAMADOL HCL 50 MG PO TABS: 100.0000 mg | ORAL_TABLET | Freq: Every evening | ORAL | Status: DC | PRN

## 2012-03-23 MED ORDER — TIZANIDINE HCL 2 MG PO TABS: 2.0000 mg | ORAL_TABLET | Freq: Every evening | ORAL | Status: DC | PRN

## 2012-03-23 NOTE — Progress Notes (Signed)
  Subjective:    Patient ID: Arthur Jock., male    DOB: [birth year], 56 y.o.   MRN: [id number]  HPI  Pain Inventory Average Pain 4 Pain Right Now 4 My pain is intermittent and aching  In the last 24 hours, has pain interfered with the following? General activity 2 Relation with others 2 Enjoyment of life 2 What TIME of day is your pain at its worst? morning evening and night Sleep (in general) Poor  Pain is worse with: walking, inactivity, standing and some activites Pain improves with: rest, heat/ice, therapy/exercise and medication Relief from Meds: 6  Mobility use a cane how many minutes can you walk? 30-60 ability to climb steps?  yes do you drive?  no transfers alone  Function retired I need assistance with the following:  dressing, bathing, meal prep, household duties and shopping Do you have any goals in this area?  yes  Neuro/Psych weakness numbness trouble walking dizziness depression anxiety  Prior Studies Any changes since last visit?  no  Physicians involved in your care Any changes since last visit?  no      Review of Systems  Musculoskeletal: Positive for gait problem.  Neurological: Positive for dizziness, weakness and numbness.  Psychiatric/Behavioral: Positive for dysphoric mood. The patient is nervous/anxious.   All other systems reviewed and are negative.       Objective:   Physical Exam        Assessment & Plan:

## 2012-03-23 NOTE — Patient Instructions (Signed)
Biceps Tendon Tendinitis (Proximal) and Tenosynovitis with Rehab Tendonitis and tenosynovitis involve inflammation of the tendon and the tendon lining (sheath). The proximal biceps tendon is vulnerable to tendonitis and tenosynovitis, which causes pain and discomfort in the front of the shoulder and upper arm. The tendon lining secretes a fluid that helps lubricate the tendon, allowing for proper function without pain. When the tendon and its lining become inflamed, the tendon can no longer glide smoothly, causing pain. The proximal biceps tendon connects the biceps muscle to two bones of the shoulder. It is important for proper function of the elbow and turning the palm upward (supination) using the wrist. Proximal biceps tendon tendinitis may include a grade 1 or 2 strain of the tendon. Grade 1 strains involve a slight pull of the tendon without signs of tearing and no observed tendon lengthening. There is also no loss of strength. Grade 2 strains involve small tears in the tendon fibers. The tendon or muscle is stretched and strength is usually decreased.  SYMPTOMS   Pain, tenderness, swelling, warmth, or redness over the front of the shoulder.   Pain that gets worse with shoulder and elbow use, especially against resistance.   Limited motion of the shoulder or elbow.   Crackling sound (crepitation) when the tendon or shoulder is moved or touched.  CAUSES  The symptoms of biceps tendonitis are due to inflammation of the tendon. Inflammation may be caused by:  Strain from sudden increase in amount or intensity of activity.   Direct blow or injury to the elbow (uncommon).   Overuse or repetitive elbow bending or wrist rotation, particularly when turning the palm up, or with elbow hyperextension.  RISK INCREASES WITH:  Sports that involve contact or overhead arm activity (throwing sports, gymnastics, weightlifting, bodybuilding, rock climbing).   Heavy labor.   Poor strength and  flexibility.   Failure to warm up properly before activity.  PREVENTION  Warm up and stretch properly before activity.   Allow time for recovery between activities.   Maintain physical fitness:   Strength, flexibility, and endurance.   Cardiovascular fitness.   Learn and use proper exercise technique.  PROGNOSIS  With proper treatment, proximal biceps tendon tendonitis and tenosynovitis is usually curable within 6 weeks. Healing is usually quicker if the cause was a direct blow, not overuse.  RELATED COMPLICATIONS   Longer healing time if not properly treated or if not given enough time to heal.   Chronically inflamed tendon that causes persistent pain with activity, that may progress to constant pain and potentially rupture of the tendon.   Recurring symptoms, especially if activity is resumed too soon or with overuse, a direct blow, or use of poor exercise technique.  TREATMENT Treatment first involves ice and medicine, to reduce pain and inflammation. It is helpful to modify activities that cause pain, to reduce the chances of causing the condition to get worse. Strengthening and stretching exercises should be performed to promote proper use of the muscles of the shoulder. These exercises may be performed at home or with a therapist. Other treatments may be given such as ultrasound or heat therapy. A corticosteroid injection may be recommended to help reduce inflammation of the tendon lining. Surgery is usually not necessary. Sometimes, if symptoms last for greater than 6 months, surgery will be advised to detach the tendon and re-insert it into the arm bone. Surgery to correct other shoulder problems that may be contributing to tendinitis may be advised before surgery for the tendinitis   itself.  MEDICATION  If pain medicine is needed, nonsteroidal anti-inflammatory medicines (aspirin and ibuprofen), or other minor pain relievers (acetaminophen), are often advised.   Do not take  pain medicine for 7 days before surgery.   Prescription pain relievers may be given if your caregiver thinks they are needed. Use only as directed and only as much as you need.   Corticosteroid injections may be given. These injections should only be used on the most severe cases, as one can only receive a limited number of them.  HEAT AND COLD   Cold treatment (icing) should be applied for 10 to 15 minutes every 2 to 3 hours for inflammation and pain, and immediately after activity that aggravates your symptoms. Use ice packs or an ice massage.   Heat treatment may be used before performing stretching and strengthening activities prescribed by your caregiver, physical therapist, or athletic trainer. Use a heat pack or a warm water soak.  SEEK MEDICAL CARE IF:   Symptoms get worse or do not improve in 2 weeks, despite treatment.   New, unexplained symptoms develop. (Drugs used in treatment may produce side effects.)  EXERCISES RANGE OF MOTION (ROM) AND EXERCISES - Biceps Tendon (Proximal) and Tenosynovitis These exercises may help you when beginning to rehabilitate your injury. Your symptoms may go away with or without further involvement from your physician, physical therapist, or athletic trainer. While completing these exercises, remember:   Restoring tissue flexibility helps normal motion to return to the joints. This allows healthier, less painful movement and activity.   An effective stretch should be held for at least 30 seconds.   A stretch should never be painful. You should only feel a gentle lengthening or release in the stretched tissue.  STRETCH - Flexion, Standing  Stand with good posture. With an underhand grip on your right / left hand and an overhand grip on the opposite hand, grasp a broomstick or cane so that your hands are a little more than shoulder width apart.   Keeping your right / left elbow straight and shoulder muscles relaxed, push the stick with your opposite  hand to raise your right / left arm in front of your body and then overhead. Raise your arm until you feel a stretch in your right / left shoulder, but before you have increased shoulder pain.   Try to avoid shrugging your right / left shoulder as your arm rises, by keeping your shoulder blade tucked down and toward your mid-back spine. Hold for __________ seconds.   Slowly return to the starting position.  Repeat __________ times. Complete this exercise __________ times per day. STRETCH - Abduction, Supine  Lie on your back. With an underhand grip on your right / left hand and an overhand grip on the opposite hand, grasp a broomstick or cane so that your hands are a little more than shoulder width apart.   Keeping your right / left elbow straight and shoulder muscles relaxed, push the stick with your opposite hand to raise your right / left arm out to the side of your body and then overhead. Raise your arm until you feel a stretch in your right / left shoulder, but before you have increased shoulder pain.   Try to avoid shrugging your right / left shoulder as your arm rises, by keeping your shoulder blade tucked down and toward your mid-back spine. Hold for __________ seconds.   Slowly return to the starting position.  Repeat __________ times. Complete this exercise __________   times per day. ROM - Flexion, Active-Assisted  Lie on your back. You may bend your knees for comfort.   Grasp a broomstick or cane so your hands are about shoulder width apart. Your right / left hand should grip the end of the stick so that your hand is positioned "thumbs-up," as if you were about to shake hands.   Using your healthy arm to lead, raise your right / left arm overhead until you feel a gentle stretch in your shoulder. Hold for __________ seconds.   Use the stick to assist in returning your right / left arm to its starting position.  Repeat __________ times. Complete this exercise __________ times per  day.  STRETCH - Flexion, Standing   Stand facing a wall. Walk your right / left fingers up the wall until you feel a moderate stretch in your shoulder. As your hand gets higher, you may need to step closer to the wall or use a door frame to walk through.   Try to avoid shrugging your right / left shoulder as your arm rises, by keeping your shoulder blade tucked down and toward your mid-back spine.   Hold for __________ seconds. Use your other hand, if needed, to ease out of the stretch and return to the starting position.  Repeat __________ times. Complete this exercise __________ times per day.  ROM - Internal Rotation   Using underhand grips, grasp a stick behind your back with both hands.   While standing upright with good posture, slide the stick up your back until you feel a mild stretch in the front of your shoulder.   Hold for __________ seconds. Slowly return to your starting position.  Repeat __________ times. Complete this exercise __________ times per day.  STRETCH - Internal Rotation  Place your right / left hand behind your back, palm-up.   Throw a towel or belt over your opposite shoulder. Grasp the towel with your right / left hand.   While keeping an upright posture, gently pull up on the towel until you feel a stretch in the front of your right / left shoulder.   Avoid shrugging your right / left shoulder as your arm rises, by keeping your shoulder blade tucked down and toward your mid-back spine.   Hold for __________ seconds. Release the stretch by lowering your opposite hand.  Repeat __________ times. Complete this exercise __________ times per day. STRENGTHENING EXERCISES - Biceps Tendon Tendinitis (Proximal) and Tenosynovitis These exercises may help you regain your strength after your physician has discontinued your restraint in a cast or brace. They may resolve your symptoms with or without further involvement from your physician, physical therapist or athletic  trainer. While completing these exercises, remember:   Muscles can gain both the endurance and the strength needed for everyday activities through controlled exercises.   Complete these exercises as instructed by your physician, physical therapist or athletic trainer. Increase the resistance and repetitions only as guided.   You may experience muscle soreness or fatigue, but the pain or discomfort you are trying to eliminate should never worsen during these exercises. If this pain does get worse, stop and make sure you are following the directions exactly. If the pain is still present after adjustments, discontinue the exercise until you can discuss the trouble with your caregiver.  STRENGTH - Elbow Flexors, Isometric  Stand or sit upright on a firm surface. Place your right / left arm so that your hand is palm-up and at the height of   your waist.   Place your opposite hand on top of your forearm. Gently push down as your right / left arm resists. Push as hard as you can with both arms, without causing any pain or movement at your right / left elbow. Hold this stationary position for __________ seconds.   Gradually release the tension in both arms. Allow your muscles to relax completely before repeating.  Repeat __________ times. Complete this exercise __________ times per day. STRENGTH - Shoulder Flexion, Isometric  With good posture and facing a wall, stand or sit about 4-6 inches away.   Keeping your right / left elbow straight, gently press the top of your fist into the wall. Increase the pressure gradually until you are pressing as hard as you can, without shrugging your shoulder or increasing any shoulder discomfort.   Hold for __________ seconds.   Release the tension slowly. Relax your shoulder muscles completely before you start the next repetition.  Repeat __________ times. Complete this exercise __________ times per day.  STRENGTH - Elbow Flexors, Supinated  With good posture,  stand or sit on a firm chair without armrests. Allow your right / left arm to rest at your side with your palm facing forward.   Holding a __________ weight, or gripping a rubber exercise band or tubing,   bring your hand toward your shoulder.   Allow your muscles to control the resistance as your hand returns to your side.  Repeat __________ times. Complete this exercise __________ times per day.  STRENGTH - Shoulder Flexion  Stand or sit with good posture. Grasp a __________ weight, or an exercise band or tubing, so that your hand is "thumbs-up," like when you shake hands.   Slowly lift your right / left arm as far as you can, without increasing any shoulder pain. At first, many people can only raise their hand to shoulder height.   Avoid shrugging your right / left shoulder as your arm rises, by keeping your shoulder blade tucked down and toward your mid-back spine.   Hold for __________ seconds. Control the descent of your hand as you slowly return to your starting position.  Repeat __________ times. Complete this exercise __________ times per day. Document Released: 10/31/2005 Document Revised: 10/20/2011 Document Reviewed: 02/12/2009 [employer] Patient Information 2012 [employer]. 

## 2012-04-19 ENCOUNTER — Ambulatory Visit: Payer: BC Managed Care – PPO | Admitting: Physical Medicine & Rehabilitation

## 2012-08-03 ENCOUNTER — Other Ambulatory Visit: Payer: Self-pay | Admitting: Physical Medicine & Rehabilitation

## 2012-09-12 ENCOUNTER — Other Ambulatory Visit: Payer: Self-pay | Admitting: Physical Medicine & Rehabilitation

## 2012-09-28 ENCOUNTER — Encounter: Payer: Self-pay | Admitting: Physical Medicine and Rehabilitation

## 2012-09-28 ENCOUNTER — Encounter
Payer: BC Managed Care – PPO | Attending: Physical Medicine and Rehabilitation | Admitting: Physical Medicine and Rehabilitation

## 2012-09-28 VITALS — BP 143/88 | HR 73 | Resp 14 | Ht 69.0 in | Wt 213.0 lb

## 2012-09-28 DIAGNOSIS — I69959 Hemiplegia and hemiparesis following unspecified cerebrovascular disease affecting unspecified side: Secondary | ICD-10-CM | POA: Insufficient documentation

## 2012-09-28 DIAGNOSIS — X58XXXA Exposure to other specified factors, initial encounter: Secondary | ICD-10-CM | POA: Insufficient documentation

## 2012-09-28 DIAGNOSIS — M7520 Bicipital tendinitis, unspecified shoulder: Secondary | ICD-10-CM

## 2012-09-28 DIAGNOSIS — G8114 Spastic hemiplegia affecting left nondominant side: Secondary | ICD-10-CM

## 2012-09-28 DIAGNOSIS — S43006A Unspecified dislocation of unspecified shoulder joint, initial encounter: Secondary | ICD-10-CM | POA: Insufficient documentation

## 2012-09-28 DIAGNOSIS — G8110 Spastic hemiplegia affecting unspecified side: Secondary | ICD-10-CM

## 2012-09-28 MED ORDER — TRAMADOL HCL 50 MG PO TABS: 100.0000 mg | ORAL_TABLET | Freq: Every evening | ORAL | Status: DC | PRN

## 2012-09-28 MED ORDER — MIRTAZAPINE 30 MG PO TABS: 30.0000 mg | ORAL_TABLET | Freq: Every day | ORAL | Status: DC

## 2012-09-28 MED ORDER — TIZANIDINE HCL 2 MG PO TABS: 2.0000 mg | ORAL_TABLET | Freq: Every evening | ORAL | Status: DC | PRN

## 2012-09-28 NOTE — Progress Notes (Signed)
Please have pt see me next visit

## 2012-09-28 NOTE — Patient Instructions (Signed)
Continue with your walking and exercising. 

## 2012-09-28 NOTE — Progress Notes (Signed)
Subjective:    Patient ID: Arthur Jock., male    DOB: [birth year], 56 y.o.   MRN: [id number]  HPI The patient is a 56 year old male, who presents with a hemiparesis on the left, s/p cerebral infarct August 2011 . The patient complains about moderate to severe pain in his left shoulder , which radiates to the deltoid insertion. Taking medications , changing positions alleviate the symptoms. Prolonged hanging down of left UE aggrevates the symptoms. The patient grades his pain as a 3 /10. He reports, that an ortho surgeon has prescribed him a brace to bring his humerus head back into the joint. Pain Inventory Average Pain 3 Pain Right Now 3 My pain is intermittent, dull and aching  In the last 24 hours, has pain interfered with the following? General activity 5 Relation with others 5 Enjoyment of life 5 What TIME of day is your pain at its worst? night Sleep (in general) Fair  Pain is worse with: walking, bending, sitting, inactivity and standing Pain improves with: rest, heat/ice and medication Relief from Meds: 7  Mobility walk with assistance use a cane how many minutes can you walk? 1-2 hours with break ability to climb steps?  yes do you drive?  no transfers alone Do you have any goals in this area?  yes  Function disabled: date disabled 06/2010 retired I need assistance with the following:  dressing, meal prep, household duties and shopping  Neuro/Psych weakness tremor tingling spasms depression anxiety  Prior Studies Any changes since last visit?  no  Physicians involved in your care Any changes since last visit?  no   Family History  Problem Relation Age of Onset  . Heart disease Mother   . Diabetes Mother   . Arthritis Mother   . Heart disease Father   . Diabetes Father    History   Social History  . Marital Status: Married    Spouse Name: N/A    Number of Children: N/A  . Years of Education: N/A   Social History Main Topics  . Smoking  status: Never Smoker   . Smokeless tobacco: Current User    Types: Chew  . Alcohol Use: None  . Drug Use: None  . Sexually Active: None   Other Topics Concern  . None   Social History Narrative  . None   Past Surgical History  Procedure Date  . Coronary artery bypass graft     4 graft   Past Medical History  Diagnosis Date  . Hyperlipidemia   . Hypertension   . Stroke   . Diabetes mellitus     states no longer high blood sugars  . Coronary artery disease   . Anxiety    BP 143/88  Pulse 73  Resp 14  Ht 5\' 9"  (1.753 m)  Wt 213 lb (96.616 kg)  BMI 31.45 kg/m2  SpO2 94%    Review of Systems  Musculoskeletal: Positive for gait problem.  Neurological: Positive for tremors and weakness.       Tingling and spasms  Psychiatric/Behavioral: Positive for dysphoric mood. The patient is nervous/anxious.   All other systems reviewed and are negative.       Objective:   Physical Exam  Constitutional: He is oriented to person, place, and time. He appears well-developed and well-nourished.  HENT:  Head: Normocephalic.  Neck: Neck supple.  Musculoskeletal: He exhibits tenderness.  Neurological: He is alert and oriented to person, place, and time.  Skin: Skin is  warm and dry.  Psychiatric: He has a normal mood and affect.    Symmetric normal motor tone is noted throughout on the right side of body, increased muscle tone on the left. Normal muscle bulk. Muscle testing reveals 5/5 muscle strength of the upper extremity, and 5/5 of the lower extremity on the right, LE on the left 4/5 except tibialis anterior 1/5, almost no voluntary movement in left UE.  Fine motor movements not possible on the left.   DTR in the upper and lower extremity are present and symmetric 2+ on the right 3++ on the left. No clonus is noted.  Patient arises from chair with difficulty. Wide based gait with a cane , wearing an AFO on the left.       Assessment & Plan:  This is a 56 year old  male  with 1.cerebral infarct right brain hemisphere Aug 2011, left spastic hemiparesis. 2. Sublux left shoulder joint   Plan : Continue with exercise and walking program. Continue with meds refilled today. Follow up in 6 month.

## 2012-12-05 ENCOUNTER — Other Ambulatory Visit: Payer: Self-pay | Admitting: Physical Medicine and Rehabilitation

## 2013-01-01 ENCOUNTER — Encounter: Payer: Self-pay | Admitting: Nurse Practitioner

## 2013-01-31 ENCOUNTER — Encounter: Payer: Self-pay | Admitting: Nurse Practitioner

## 2013-01-31 ENCOUNTER — Ambulatory Visit (INDEPENDENT_AMBULATORY_CARE_PROVIDER_SITE_OTHER): Payer: Medicare Other | Admitting: Nurse Practitioner

## 2013-01-31 VITALS — BP 135/83 | HR 74 | Ht 69.5 in | Wt 217.0 lb

## 2013-01-31 DIAGNOSIS — I6350 Cerebral infarction due to unspecified occlusion or stenosis of unspecified cerebral artery: Secondary | ICD-10-CM

## 2013-01-31 DIAGNOSIS — I639 Cerebral infarction, unspecified: Secondary | ICD-10-CM

## 2013-01-31 DIAGNOSIS — G40209 Localization-related (focal) (partial) symptomatic epilepsy and epileptic syndromes with complex partial seizures, not intractable, without status epilepticus: Secondary | ICD-10-CM

## 2013-01-31 MED ORDER — LEVETIRACETAM 250 MG PO TABS: 250.0000 mg | ORAL_TABLET | Freq: Two times a day (BID) | ORAL | Status: DC

## 2013-01-31 NOTE — Patient Instructions (Addendum)
Continue Plavix  For secondary stroke prevention.  Continue Keppra for seizures. Call for further seizure activity. Rx called. Repeat carotid  Doppler, f/u to previous that was abnormal. If you want a copy we can give one to you. Check with PCP about different statin medication due to your complaints of weakness in the legs.  Increase risk for falls, use cane and help when outside.  F/U 6 months

## 2013-01-31 NOTE — Progress Notes (Signed)
HPI:   Patient returns for followup after last seeing Dr. Pearlean Brownie. His right MCA infarct occurred August 2011. He had isolated partial seizure with secondary generalization in March 2012 likely as late effects from stroke. He has vascular risk factors of hypertension hyperlipidemia coronary artery disease and  carotid occlusion. He says recent labs were good done at primary care. Blood pressure has been in good control. He denies double vision, loss of vision, focal weakness, numbness, speech or swallowing problems, confusions, blackouts.  He does complain of generalized weakness in his lower extremities which he feels is coming from his statin drugs.    ROS:  See above visit information  Physical Exam General: well developed, well nourished, seated, in no evident distress Head: head normocephalic and atraumatic. Orohparynx benign Neck: supple with no carotid or supraclavicular bruits Cardiovascular: regular rate and rhythm, no murmurs  Neurologic Exam Mental Status: Awake and fully alert. Oriented to place and time. Recent and remote memory intact. Attention span, concentration and fund of knowledge appropriate. Mood and affect appropriate.  Cranial Nerves: Fundoscopic exam not done. Pupils equal, briskly reactive to light. Extraocular movements full without nystagmus. Visual fields show dense left hemianopsia to confrontational testing.  Hearing intact and symmetric to finer snap. Facial sensation intact. Face with mild left lower weakness.  tongue, palate move normally and symmetrically.   Motor:Spastic left hemiplegia with 1/5 upper extremity and 0/5 distal weakness. Left lower extremity is 4/5 and 1/5 ankle dorsiflexion with foot drop. AFO in place.  Sensory.: intact to tough and pinprick and vibratory on right , decreased on the left.   Coordination: Rapid alternating movements normal on the right Unable to perform on the left.  Gait and Station: Spastic hemiplegic gait, using cane, drags left  foot.   Reflexes: 1+ and symmetric on the right, brisk on the left.  Toes downgoing.     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 Vascular risk factors of hypertension, hyperlipidemia, coronary artery disease and carotid occlusion.     PLAN:  Continue Plavix for secondary stroke prevention . Continue Keppra for seizures, Rx renewed . Call for any seizure activity . Strict control of lipids with LDL below 80% and blood pressure goal below 130 systolic. Will check followup carotid Doppler,  last on 04/17/2012. The study was consistent with total occlusion of the right bulb, the right ICA as well as 50-69% stenosis involving the left bulb and left proximal ICA.     Nilda Riggs, GNP-BC APRN

## 2013-02-04 ENCOUNTER — Other Ambulatory Visit: Payer: Self-pay | Admitting: Physical Medicine and Rehabilitation

## 2013-02-14 ENCOUNTER — Ambulatory Visit (INDEPENDENT_AMBULATORY_CARE_PROVIDER_SITE_OTHER): Payer: Medicare Other

## 2013-02-14 DIAGNOSIS — I639 Cerebral infarction, unspecified: Secondary | ICD-10-CM

## 2013-02-14 DIAGNOSIS — G8110 Spastic hemiplegia affecting unspecified side: Secondary | ICD-10-CM

## 2013-02-21 ENCOUNTER — Telehealth: Payer: Self-pay | Admitting: Nurse Practitioner

## 2013-02-21 NOTE — Telephone Encounter (Signed)
Please let patient know there has been no change in carotid doppler done 02/14/2013 from the one 04/17/2012.

## 2013-02-21 NOTE — Telephone Encounter (Signed)
Patient is aware of his doppler results.

## 2013-03-28 ENCOUNTER — Other Ambulatory Visit: Payer: Self-pay | Admitting: Physical Medicine and Rehabilitation

## 2013-03-29 ENCOUNTER — Encounter
Payer: Medicare Other | Attending: Physical Medicine and Rehabilitation | Admitting: Physical Medicine and Rehabilitation

## 2013-03-29 ENCOUNTER — Encounter: Payer: Self-pay | Admitting: Physical Medicine and Rehabilitation

## 2013-03-29 VITALS — BP 142/79 | HR 71 | Resp 16 | Ht 69.0 in | Wt 220.0 lb

## 2013-03-29 DIAGNOSIS — S43006A Unspecified dislocation of unspecified shoulder joint, initial encounter: Secondary | ICD-10-CM | POA: Insufficient documentation

## 2013-03-29 DIAGNOSIS — I2510 Atherosclerotic heart disease of native coronary artery without angina pectoris: Secondary | ICD-10-CM | POA: Insufficient documentation

## 2013-03-29 DIAGNOSIS — Z951 Presence of aortocoronary bypass graft: Secondary | ICD-10-CM | POA: Insufficient documentation

## 2013-03-29 DIAGNOSIS — I6350 Cerebral infarction due to unspecified occlusion or stenosis of unspecified cerebral artery: Secondary | ICD-10-CM

## 2013-03-29 DIAGNOSIS — X58XXXA Exposure to other specified factors, initial encounter: Secondary | ICD-10-CM | POA: Insufficient documentation

## 2013-03-29 DIAGNOSIS — I10 Essential (primary) hypertension: Secondary | ICD-10-CM | POA: Insufficient documentation

## 2013-03-29 DIAGNOSIS — I639 Cerebral infarction, unspecified: Secondary | ICD-10-CM

## 2013-03-29 DIAGNOSIS — G8110 Spastic hemiplegia affecting unspecified side: Secondary | ICD-10-CM

## 2013-03-29 DIAGNOSIS — G8114 Spastic hemiplegia affecting left nondominant side: Secondary | ICD-10-CM

## 2013-03-29 DIAGNOSIS — E785 Hyperlipidemia, unspecified: Secondary | ICD-10-CM | POA: Insufficient documentation

## 2013-03-29 DIAGNOSIS — I69959 Hemiplegia and hemiparesis following unspecified cerebrovascular disease affecting unspecified side: Secondary | ICD-10-CM | POA: Insufficient documentation

## 2013-03-29 MED ORDER — MIRTAZAPINE 30 MG PO TABS: ORAL_TABLET | ORAL | Status: DC

## 2013-03-29 NOTE — Patient Instructions (Signed)
Stay as active as tolerated. 

## 2013-03-29 NOTE — Progress Notes (Signed)
Subjective:    Patient ID: Arthur Jock., male    DOB: 03/07/1956, 57 y.o.   MRN: [id number]  HPI The patient is a 57 year old male, who presents with a hemiparesis on the left, s/p cerebral infarct August 2011 . The patient complains about moderate to severe pain in his left shoulder , which radiates to the deltoid insertion. Taking medications , changing positions alleviate the symptoms. Prolonged hanging down of left UE aggrevates the symptoms. The patient grades his pain as a 5 /10.   Pain Inventory Average Pain 5 Pain Right Now 5 My pain is intermittent and aching  In the last 24 hours, has pain interfered with the following? General activity 5 Relation with others 5 Enjoyment of life 5 What TIME of day is your pain at its worst? night Sleep (in general) na  Pain is worse with: walking, sitting, inactivity and standing Pain improves with: therapy/exercise Relief from Meds: na  Mobility walk with assistance use a cane how many minutes can you walk? 2-3 hours ability to climb steps?  yes do you drive?  no  Function retired Do you have any goals in this area?  yes  Neuro/Psych weakness depression  Prior Studies Any changes since last visit?  no  Physicians involved in your care Any changes since last visit?  no   Family History  Problem Relation Age of Onset  . Heart disease Mother   . Diabetes Mother   . Arthritis Mother   . Heart disease Father   . Diabetes Father   . Stroke Other   . Gout Other   . Coronary artery disease Other    History   Social History  . Marital Status: Married    Spouse Name: N/A    Number of Children: N/A  . Years of Education: N/A   Social History Main Topics  . Smoking status: Never Smoker   . Smokeless tobacco: Never Used  . Alcohol Use: No  . Drug Use: None  . Sexually Active: None   Other Topics Concern  . None   Social History Narrative  . None   Past Surgical History  Procedure Laterality Date  .  Coronary artery bypass graft      4 graft   Past Medical History  Diagnosis Date  . Hyperlipidemia   . Hypertension   . Stroke   . Diabetes mellitus     states no longer high blood sugars  . Coronary artery disease   . Anxiety    BP 142/79  Pulse 71  Resp 16  Ht 5\' 9"  (1.753 m)  Wt 220 lb (99.791 kg)  BMI 32.47 kg/m2  SpO2 97%     Review of Systems  Neurological: Positive for weakness.  Psychiatric/Behavioral: Positive for dysphoric mood.  All other systems reviewed and are negative.       Objective:   Physical Exam Constitutional: He is oriented to person, place, and time. He appears well-developed and well-nourished.  HENT:  Head: Normocephalic.  Neck: Neck supple.  Musculoskeletal: He exhibits tenderness.  Neurological: He is alert and oriented to person, place, and time.  Skin: Skin is warm and dry.  Psychiatric: He has a normal mood and affect.  Symmetric normal motor tone is noted throughout on the right side of body, increased muscle tone on the left. Normal muscle bulk. Muscle testing reveals 5/5 muscle strength of the upper extremity, and 5/5 of the lower extremity on the right, LE on the  left 4/5 except tibialis anterior 1/5, almost no voluntary movement in left UE. Fine motor movements not possible on the left.  DTR in the upper and lower extremity are present and symmetric 2+ on the right 3++ on the left. No clonus is noted.  Patient arises from chair with difficulty. Wide based gait with a cane , wearing an AFO on the left.        Assessment & Plan:  This is a 56 year old male with  1.cerebral infarct right brain hemisphere Aug 2011, left spastic hemiparesis.  2. Sublux left shoulder joint , pateitn got a brace to keepthe humerus in the joint, but is not wearing it, because it is so heavy and uncomfortable, advised the patient to go back to the ortho-tech, to make some changes , so it is more comfortable for the patient to wear it. Plan :  Continue  with exercise and walking program. Meds refilled today.  Follow up in 6 month.

## 2013-04-26 ENCOUNTER — Ambulatory Visit: Payer: Self-pay

## 2013-08-05 ENCOUNTER — Ambulatory Visit (INDEPENDENT_AMBULATORY_CARE_PROVIDER_SITE_OTHER): Payer: Medicare Other | Admitting: Nurse Practitioner

## 2013-08-05 ENCOUNTER — Encounter: Payer: Self-pay | Admitting: Nurse Practitioner

## 2013-08-05 VITALS — BP 138/95 | HR 68 | Ht 69.0 in | Wt 228.0 lb

## 2013-08-05 DIAGNOSIS — G8110 Spastic hemiplegia affecting unspecified side: Secondary | ICD-10-CM

## 2013-08-05 DIAGNOSIS — I63239 Cerebral infarction due to unspecified occlusion or stenosis of unspecified carotid arteries: Secondary | ICD-10-CM | POA: Insufficient documentation

## 2013-08-05 DIAGNOSIS — G8114 Spastic hemiplegia affecting left nondominant side: Secondary | ICD-10-CM

## 2013-08-05 DIAGNOSIS — R569 Unspecified convulsions: Secondary | ICD-10-CM | POA: Insufficient documentation

## 2013-08-05 NOTE — Patient Instructions (Addendum)
Continue Plavix for secondary stroke prevention Continue Keppra for seizures, call for  any seizure activity Strict control of lipids with LDL below  80% Blood pressure systolic below 130 Repeat Doppler studies March of next year Followup in 6 months

## 2013-08-05 NOTE — Progress Notes (Signed)
[facility]  PATIENT: Arthur Cisneros. DOB: November 28, 1955   REASON FOR VISIT: Followup for history of stroke and seizures   HISTORY OF PRESENT ILLNESS: [name], 57 year old white male returns for followup he has history of  right MCA infarct occurred August 2011. He had isolated partial seizure with secondary generalization in March 2012 likely as late effects from stroke. He has vascular risk factors of hypertension, hyperlipidemia, coronary artery disease and carotid occlusion. He says recent labs were good done at primary care. Blood pressure has been in good control but is slightly elevated today at 138/92. He denies double vision, loss of vision, focal weakness, numbness, speech or swallowing problems, confusions, blackouts. He continues  to wear  a brace on the left lower leg. Recent carotid Doppler studies without change from previous year.   REVIEW OF SYSTEMS: Full 14 system review of systems performed and notable only for:  Constitutional: N/A  Cardiovascular: N/A  Ear/Nose/Throat: N/A  Skin: N/A  Eyes: N/A  Respiratory: N/A  Gastroitestinal: N/A  Hematology/Lymphatic: N/A  Endocrine: N/A Musculoskeletal joint pain Allergy/Immunology: N/A  Neurological: Weakness  Psychiatric: N/A   ALLERGIES: No Known Allergies  HOME MEDICATIONS: Outpatient Prescriptions Prior to Visit  Medication Sig Dispense Refill  . atorvastatin (LIPITOR) 40 MG tablet Take 40 mg by mouth daily.      . clopidogrel (PLAVIX) 75 MG tablet Take 75 mg by mouth daily.      . diclofenac sodium (VOLTAREN) 1 % GEL Apply 1 application topically 4 (four) times daily.      Marland Kitchen docusate sodium (COLACE) 100 MG capsule Take 100 mg by mouth as needed.      . levETIRAcetam (KEPPRA) 250 MG tablet Take 1 tablet (250 mg total) by mouth 2 (two) times daily.  60 tablet  6  . losartan (COZAAR) 25 MG tablet Take 25 mg by mouth daily.      . mirtazapine (REMERON) 30 MG tablet TAKE 1 TABLET AT BEDTIME.   30 tablet  5  . nitroGLYCERIN (NITROSTAT) 0.4 MG SL tablet Place 0.4 mg under the tongue every 5 (five) minutes as needed.      . ranitidine (ZANTAC) 150 MG tablet Take 150 mg by mouth as needed for heartburn.      Marland Kitchen tiZANidine (ZANAFLEX) 2 MG tablet TAKE ONE TABLET AT BED-TIME WHEN NEEDED.  30 tablet  5  . traMADol (ULTRAM) 50 MG tablet Take 2 tablets (100 mg total) by mouth at bedtime as needed.  60 tablet  5   No facility-administered medications prior to visit.    PAST MEDICAL HISTORY: Past Medical History  Diagnosis Date  . Hyperlipidemia   . Hypertension   . Stroke   . Diabetes mellitus     states no longer high blood sugars  . Coronary artery disease   . Anxiety     PAST SURGICAL HISTORY: Past Surgical History  Procedure Laterality Date  . Coronary artery bypass graft      4 graft    FAMILY HISTORY: Family History  Problem Relation Age of Onset  . Heart disease Mother   . Diabetes Mother   . Arthritis Mother   . Heart disease Father   . Diabetes Father   . Stroke Other   . Gout Other   . Coronary artery disease Other     SOCIAL HISTORY: History   Social History  . Marital Status: Married    Spouse Name: N/A    Number of Children:  N/A  . Years of Education: N/A   Occupational History  . Not on file.   Social History Main Topics  . Smoking status: Never Smoker   . Smokeless tobacco: Current User    Types: Chew  . Alcohol Use: No  . Drug Use: No  . Sexual Activity: Not on file   Other Topics Concern  . Not on file   Social History Narrative  . No narrative on file     PHYSICAL EXAM  Filed Vitals:   08/05/13 1440  BP: 138/95  Pulse: 68  Height: 5\' 9"  (1.753 m)  Weight: 228 lb (103.42 kg)   Body mass index is 33.65 kg/(m^2).  Generalized: Well developed, in no acute distress  Head: normocephalic and atraumatic,. Oropharynx benign  Neck: Supple, no carotid bruits  Cardiac: Regular rate rhythm, no murmur  Neurological examination    Mentation: Alert oriented to time, place, history taking. Follows all commands speech and language fluent  Cranial nerve II-XII: Pupils were equal round reactive to light extraocular movements were full, visual field show dense left hemianopsia,   face with mild left lower weakness  hearing was intact to finger rubbing bilaterally. Uvula tongue midline. head turning and shoulder shrug and were normal and symmetric.Tongue protrusion into cheek strength was normal. Motor: Spastic left hemiplegia with 1/5 upper extremity and 0/5 distal weakness. Left lower extremity is 4/5 and 1/5 ankle dorsiflexion and footdrop. AFO in place . Sensory: Intact on the right, decreased on left   Coordination: finger-nose-finger, normal on the right unable to perform on the left. Reflexes: 1+ and symmetric on the right , brisk on the left Gait and Station: Spastic hemiparetic gait, uses cane drags the left foot   DIAGNOSTIC DATA (LABS, IMAGING, TESTING) -None for review    ASSESSMENT AND PLAN  57 y.o. year old male  has a past medical history of Hyperlipidemia; Hypertension; Stroke; Diabetes mellitus; Coronary artery disease; and Anxiety. here for followup of stroke and seizure disorder  Continue Plavix for secondary stroke prevention Continue Keppra for seizures, call for any seizure activity Strict control of lipids with LDL below  80% Blood pressure systolic below 130 Repeat Doppler studies March of next year Followup in 6 months  Nilda Riggs, [facility], [facility], APRN  [facility] [address] [place], [place] [address] [phone]

## 2013-09-12 ENCOUNTER — Other Ambulatory Visit: Payer: Self-pay

## 2013-09-12 MED ORDER — LEVETIRACETAM 250 MG PO TABS: 250.0000 mg | ORAL_TABLET | Freq: Two times a day (BID) | ORAL | Status: DC

## 2013-09-13 NOTE — Telephone Encounter (Signed)
Script faxed to [phone], not as indicated below.

## 2013-09-13 NOTE — Telephone Encounter (Signed)
Please note

## 2013-09-13 NOTE — Telephone Encounter (Signed)
I called and spoke with patient. RX will be faxed to [phone] today.

## 2013-09-23 ENCOUNTER — Other Ambulatory Visit: Payer: Self-pay | Admitting: Physical Medicine and Rehabilitation

## 2013-09-27 ENCOUNTER — Encounter
Payer: Medicare Other | Attending: Physical Medicine and Rehabilitation | Admitting: Physical Medicine and Rehabilitation

## 2013-09-27 ENCOUNTER — Encounter: Payer: Self-pay | Admitting: Physical Medicine and Rehabilitation

## 2013-09-27 VITALS — BP 139/77 | HR 71 | Resp 14 | Ht 69.0 in | Wt 232.8 lb

## 2013-09-27 DIAGNOSIS — I639 Cerebral infarction, unspecified: Secondary | ICD-10-CM

## 2013-09-27 DIAGNOSIS — G8110 Spastic hemiplegia affecting unspecified side: Secondary | ICD-10-CM

## 2013-09-27 DIAGNOSIS — M24419 Recurrent dislocation, unspecified shoulder: Secondary | ICD-10-CM | POA: Insufficient documentation

## 2013-09-27 DIAGNOSIS — G8114 Spastic hemiplegia affecting left nondominant side: Secondary | ICD-10-CM

## 2013-09-27 DIAGNOSIS — I6350 Cerebral infarction due to unspecified occlusion or stenosis of unspecified cerebral artery: Secondary | ICD-10-CM

## 2013-09-27 DIAGNOSIS — I69959 Hemiplegia and hemiparesis following unspecified cerebrovascular disease affecting unspecified side: Secondary | ICD-10-CM | POA: Insufficient documentation

## 2013-09-27 NOTE — Patient Instructions (Signed)
Continue with staying as active as tolerated and as it is safe

## 2013-09-27 NOTE — Progress Notes (Signed)
Subjective:    Patient ID: Arthur Cisneros., male    DOB: 1956-08-04, 57 y.o.   MRN: [id number]  HPI The patient is a 57 year old male, who presents with a hemiparesis on the left, s/p cerebral infarct August 2011 . The patient complains about moderate to severe pain in his left shoulder , which radiates to the deltoid insertion. Taking medications , changing positions alleviate the symptoms. Prolonged hanging down of left UE aggrevates the symptoms. The patient grades his pain as a 5 /10.   Pain Inventory Average Pain 5 Pain Right Now 5 My pain is aching  In the last 24 hours, has pain interfered with the following? General activity 5 Relation with others 5 Enjoyment of life 6 What TIME of day is your pain at its worst? morning and night Sleep (in general) Good  Pain is worse with: walking, bending, sitting, inactivity and standing Pain improves with: rest and therapy/exercise Relief from Meds: 5  Mobility use a cane how many minutes can you walk? 60 ability to climb steps?  yes do you drive?  no  Function retired I need assistance with the following:  dressing, bathing, meal prep, household duties and shopping  Neuro/Psych weakness depression anxiety  Prior Studies Any changes since last visit?  no  Physicians involved in your care Any changes since last visit?  no   Family History  Problem Relation Age of Onset  . Heart disease Mother   . Diabetes Mother   . Arthritis Mother   . Heart disease Father   . Diabetes Father   . Stroke Other   . Gout Other   . Coronary artery disease Other    History   Social History  . Marital Status: Married    Spouse Name: N/A    Number of Children: N/A  . Years of Education: N/A   Social History Main Topics  . Smoking status: Never Smoker   . Smokeless tobacco: Current User    Types: Chew  . Alcohol Use: No  . Drug Use: No  . Sexual Activity: None   Other Topics Concern  . None   Social History Narrative   . None   Past Surgical History  Procedure Laterality Date  . Coronary artery bypass graft      4 graft   Past Medical History  Diagnosis Date  . Hyperlipidemia   . Hypertension   . Stroke   . Diabetes mellitus     states no longer high blood sugars  . Coronary artery disease   . Anxiety    BP 139/77  P 71  R 14  O2 sat 97% WT 232.8lb  Ht 69"    Review of Systems  Musculoskeletal: Positive for gait problem.  Neurological: Positive for weakness.  Psychiatric/Behavioral: Positive for dysphoric mood. The patient is nervous/anxious.        Objective:   Physical Exam Constitutional: He is oriented to person, place, and time. He appears well-developed and well-nourished.  HENT:  Head: Normocephalic.  Neck: Neck supple.  Musculoskeletal: He exhibits tenderness.  Neurological: He is alert and oriented to person, place, and time.  Skin: Skin is warm and dry.  Psychiatric: He has a normal mood and affect.  Symmetric normal motor tone is noted throughout on the right side of body, increased muscle tone on the left. Normal muscle bulk. Muscle testing reveals 5/5 muscle strength of the upper extremity, and 5/5 of the lower extremity on the right, LE  on the left 4/5 except tibialis anterior 1/5, almost no voluntary movement in left UE. Fine motor movements not possible on the left.  DTR in the upper and lower extremity are present and symmetric 2+ on the right 3++ on the left. No clonus is noted.  Patient arises from chair with difficulty. Wide based gait with a cane , wearing an AFO on the left. Step off from acromion to humeral head of almost 2 cm on the left       Assessment & Plan:  This is a 57 year old male with  1.cerebral infarct right brain hemisphere Aug 2011, left spastic hemiparesis.  2. Sublux left shoulder joint , patient got a brace to keep the humerus in the joint, but is not wearing it, because it is so heavy and uncomfortable, advised the patient to go back to  the ortho-tech, to make some changes , so it is more comfortable for the patient to wear it, or provide him with a new one, ordered a new brace for his left shoulder.  Plan :  Continue with exercise and walking program.He did not need Meds refilled today.  Follow up in 6 month.

## 2013-12-09 ENCOUNTER — Other Ambulatory Visit: Payer: Self-pay

## 2013-12-09 MED ORDER — MIRTAZAPINE 30 MG PO TABS: ORAL_TABLET | ORAL | Status: DC

## 2013-12-19 ENCOUNTER — Telehealth: Payer: Self-pay

## 2013-12-19 NOTE — Telephone Encounter (Signed)
Would like to re eval pt first

## 2013-12-19 NOTE — Telephone Encounter (Signed)
Spoke with patients wife.  Appointment made to discuss leg brace.

## 2013-12-19 NOTE — Telephone Encounter (Signed)
Patients wife called to see if she could get an order for a brace for the patients leg.

## 2014-01-14 ENCOUNTER — Ambulatory Visit (HOSPITAL_BASED_OUTPATIENT_CLINIC_OR_DEPARTMENT_OTHER): Payer: 59 | Admitting: Physical Medicine & Rehabilitation

## 2014-01-14 ENCOUNTER — Encounter: Payer: Medicare Other | Attending: Physical Medicine & Rehabilitation

## 2014-01-14 ENCOUNTER — Encounter: Payer: Self-pay | Admitting: Physical Medicine & Rehabilitation

## 2014-01-14 VITALS — BP 148/99 | HR 68 | Resp 14 | Ht 69.0 in | Wt 231.0 lb

## 2014-01-14 DIAGNOSIS — G8114 Spastic hemiplegia affecting left nondominant side: Secondary | ICD-10-CM

## 2014-01-14 DIAGNOSIS — G8110 Spastic hemiplegia affecting unspecified side: Secondary | ICD-10-CM

## 2014-01-14 NOTE — Progress Notes (Signed)
Subjective:    Patient ID: Arthur Manges., male    DOB: [birth year], 58 y.o.   MRN: [id number]  HPI Patient requesting new left AFO Having difficulties with Velcro closures as well as footplate  Ambulates with cane  Pain Inventory Average Pain 5 Pain Right Now 5 My pain is aching  In the last 24 hours, has pain interfered with the following? General activity 5 Relation with others 5 Enjoyment of life 5 What TIME of day is your pain at its worst? morning and evening Sleep (in general) Fair  Pain is worse with: walking, bending, sitting, inactivity and standing Pain improves with: rest and medication Relief from Meds: 5  Mobility walk with assistance use a cane how many minutes can you walk? 30-60 ability to climb steps?  yes do you drive?  no transfers alone Do you have any goals in this area?  yes  Function retired I need assistance with the following:  dressing, bathing, meal prep, household duties and shopping Do you have any goals in this area?  yes  Neuro/Psych weakness tingling depression  Prior Studies Any changes since last visit?  no  Physicians involved in your care Any changes since last visit?  no   Family History  Problem Relation Age of Onset  . Heart disease Mother   . Diabetes Mother   . Arthritis Mother   . Heart disease Father   . Diabetes Father   . Stroke Other   . Gout Other   . Coronary artery disease Other    History   Social History  . Marital Status: Married    Spouse Name: N/A    Number of Children: N/A  . Years of Education: N/A   Social History Main Topics  . Smoking status: Never Smoker   . Smokeless tobacco: Current User    Types: Chew  . Alcohol Use: No  . Drug Use: No  . Sexual Activity: None   Other Topics Concern  . None   Social History Narrative  . None   Past Surgical History  Procedure Laterality Date  . Coronary artery bypass graft      4 graft   Past Medical History  Diagnosis Date    . Hyperlipidemia   . Hypertension   . Stroke   . Diabetes mellitus     states no longer high blood sugars  . Coronary artery disease   . Anxiety    BP 148/99  Pulse 68  Resp 14  Ht 5\' 9"  (1.753 m)  Wt 231 lb (104.781 kg)  BMI 34.10 kg/m2  SpO2 95%  Opioid Risk Score:   Fall Risk Score: High Fall Risk (>13 points) (patient educated handout given)   Review of Systems  Cardiovascular: Positive for leg swelling.  Neurological: Positive for weakness.  Psychiatric/Behavioral: Positive for dysphoric mood.  All other systems reviewed and are negative.       Objective:   Physical Exam  Motor strength 2 minus at the toe flexors and toe extensors of the left foot 2 minus ankle dorsiflexor plantar flexor 4/5 quadriceps  Evidence of clonus at the ankle Hyper reflexive patellar reflex left knee Patient with reduced sensation left foot however able to identify light touch on toes. Skin shows no evidence of skin breakdown. Normal pedal and posterior tibial pulses      Assessment & Plan:  1 right MCA distribution infarct with chronic left spastic hemiplegia. Needs new AFO will write prescription. Return in 4-6  weeks for orthotic recheck

## 2014-01-14 NOTE — Patient Instructions (Signed)
Written prescription for Left AFO at [facility]

## 2014-01-24 ENCOUNTER — Other Ambulatory Visit: Payer: Self-pay

## 2014-01-24 MED ORDER — TIZANIDINE HCL 2 MG PO TABS: ORAL_TABLET | ORAL | Status: DC

## 2014-02-06 ENCOUNTER — Encounter: Payer: Self-pay | Admitting: Nurse Practitioner

## 2014-02-06 ENCOUNTER — Ambulatory Visit (INDEPENDENT_AMBULATORY_CARE_PROVIDER_SITE_OTHER): Payer: Medicare Other | Admitting: Nurse Practitioner

## 2014-02-06 VITALS — BP 132/84 | HR 74 | Ht 69.0 in | Wt 231.0 lb

## 2014-02-06 DIAGNOSIS — G8114 Spastic hemiplegia affecting left nondominant side: Secondary | ICD-10-CM

## 2014-02-06 DIAGNOSIS — G8110 Spastic hemiplegia affecting unspecified side: Secondary | ICD-10-CM

## 2014-02-06 DIAGNOSIS — I63239 Cerebral infarction due to unspecified occlusion or stenosis of unspecified carotid arteries: Secondary | ICD-10-CM

## 2014-02-06 DIAGNOSIS — R569 Unspecified convulsions: Secondary | ICD-10-CM

## 2014-02-06 MED ORDER — CLOPIDOGREL BISULFATE 75 MG PO TABS: 75.0000 mg | ORAL_TABLET | Freq: Every day | ORAL | Status: DC

## 2014-02-06 MED ORDER — LEVETIRACETAM 250 MG PO TABS: 250.0000 mg | ORAL_TABLET | Freq: Two times a day (BID) | ORAL | Status: DC

## 2014-02-06 NOTE — Progress Notes (Signed)
[facility]  PATIENT: Arthur Cisneros. DOB: 12/31/1955   REASON FOR VISIT: Followup for history of stroke and seizures   HISTORY OF PRESENT ILLNESS:Arthur Cisneros, 58 year old male returns for followup. He has a history of right MCA infarct in 2011 and also isolated seizure with secondary generalization in March of 2012 likely has late effect of his stroke. He remains on Keppra without further seizure activity, and he is on Plavix with minimal bruising. He continues to wear her brace on the left lower leg and on his left arm. He is due for Doppler study. He denies any double vision, increased focal weakness, speech or swallowing difficulty, blackouts or confusion. He continues to walk around the house for exercise. He returns for reevaluation.   HISTORY: of right MCA infarct occurred August 2011. He had isolated partial seizure with secondary generalization in March 2012 likely as late effects from stroke. He has vascular risk factors of hypertension, hyperlipidemia, coronary artery disease and carotid occlusion. He says recent labs were good done at primary care. Blood pressure has been in good control but is slightly elevated today at 138/92. He denies double vision, loss of vision, focal weakness, numbness, speech or swallowing problems, confusions, blackouts. He continues to wear a brace on the left lower leg. Recent carotid Doppler studies without change from previous year.   REVIEW OF SYSTEMS: Full 14 system review of systems performed and notable only for those listed, all others are neg:  Constitutional: N/A  Cardiovascular: N/A  Ear/Nose/Throat: N/A  Skin: N/A  Eyes: N/A  Respiratory: N/A  Gastroitestinal: N/A  Hematology/Lymphatic: N/A  Endocrine: N/A Musculoskeletal:N/A  Allergy/Immunology: N/A  Neurological: Rare headache  Psychiatric: N/A   ALLERGIES: No Known Allergies  HOME MEDICATIONS: Outpatient Prescriptions Prior to Visit  Medication Sig  Dispense Refill  . atorvastatin (LIPITOR) 40 MG tablet Take 40 mg by mouth daily.      . clopidogrel (PLAVIX) 75 MG tablet Take 75 mg by mouth daily.      . diclofenac sodium (VOLTAREN) 1 % GEL Apply 1 application topically 4 (four) times daily.      Marland Kitchen docusate sodium (COLACE) 100 MG capsule Take 100 mg by mouth as needed.      . levETIRAcetam (KEPPRA) 250 MG tablet Take 1 tablet (250 mg total) by mouth 2 (two) times daily.  60 tablet  4  . losartan (COZAAR) 25 MG tablet Take 25 mg by mouth daily.      . mirtazapine (REMERON) 30 MG tablet TAKE 1 TABLET AT BEDTIME.  30 tablet  5  . nitroGLYCERIN (NITROSTAT) 0.4 MG SL tablet Place 0.4 mg under the tongue every 5 (five) minutes as needed.      . ranitidine (ZANTAC) 150 MG tablet Take 150 mg by mouth as needed for heartburn.      Marland Kitchen tiZANidine (ZANAFLEX) 2 MG tablet TAKE ONE TABLET AT BED-TIME WHEN NEEDED.  30 tablet  3   No facility-administered medications prior to visit.    PAST MEDICAL HISTORY: Past Medical History  Diagnosis Date  . Hyperlipidemia   . Hypertension   . Stroke   . Diabetes mellitus     states no longer high blood sugars  . Coronary artery disease   . Anxiety     PAST SURGICAL HISTORY: Past Surgical History  Procedure Laterality Date  . Coronary artery bypass graft      4 graft    FAMILY HISTORY: Family History  Problem Relation Age of Onset  .  Heart disease Mother   . Diabetes Mother   . Arthritis Mother   . Heart disease Father   . Diabetes Father   . Stroke Other   . Gout Other   . Coronary artery disease Other     SOCIAL HISTORY: History   Social History  . Marital Status: Married    Spouse Name: [name]    Number of Children: 5  . Years of Education: 8th    Occupational History  .     Social History Main Topics  . Smoking status: Never Smoker   . Smokeless tobacco: Current User    Types: Chew  . Alcohol Use: No  . Drug Use: No  . Sexual Activity: Not on file   Other Topics Concern    . Not on file   Social History Narrative   Patient lives at home with wife [name].    Patient has 5 children.    Patient has a 8th grade.    Patient is retired.    Patient is on disability.    Patient is right handed.    Patient drinks caffeine every once in a while.      PHYSICAL EXAM  Filed Vitals:   02/06/14 1332  BP: 132/84  Pulse: 74  Height: 5\' 9"  (1.753 m)  Weight: 231 lb (104.781 kg)   Body mass index is 34.1 kg/(m^2).  Generalized: Well developed, in no acute distress  Head: normocephalic and atraumatic,. Oropharynx benign  Neck: Supple, no carotid bruits  Cardiac: Regular rate rhythm, no murmur  Musculoskeletal: AFO to left leg, brace to left arm Neurological examination   Mentation: Alert oriented to time, place, history taking. Follows all commands speech and language fluent  Cranial nerve II-XII: Fundoscopic exam reveals sharp disc margins.Pupils were equal round reactive to light extraocular movements were full, visual field with dense left hemianopsia . Mild left lower facial weakness Hearing was intact to finger rubbing bilaterally. Uvula tongue midline. head turning and shoulder shrug were normal and symmetric.Tongue protrusion into cheek strength was normal. Motor: Spastic left hemiplegia with 1/5 upper extremity and 0/5 distal weakness. Left lower extremity is 4/5 and 1/5 ankle dorsiflexion and footdrop. Sensory: Intact to primary modalities on the right, decreased on the left Coordination: finger-nose-finger, heel-to-shin bilaterally, normal on the right unable to perform on the left  Reflexes: Brisker on the left, 1+ on the right Gait and Station: Gait hemiparetic uses a cane, drags his left foot DIAGNOSTIC DATA (LABS, IMAGING, TESTING) - ASSESSMENT AND PLAN  58 y.o. year old male  has a past medical history of Hyperlipidemia; Hypertension; Stroke; Diabetes mellitus; Coronary artery disease; seizure disorder and Anxiety. here to followup. He has not  had further seizure activity  Continue Keppra at current dose will refill Continue Plavix at current dose will refill Will set up for carotid Doppler Please bring copy of labs will be covered Doppler study Followup in 6-8 months Dennie Bible, [facility], [facility], [facility] [address], [place], [place] [address] [phone]

## 2014-02-06 NOTE — Patient Instructions (Signed)
Continue Keppra at current dose will refill Continue Plavix at current dose will refill Will set up for carotid Doppler Please bring copy of labs will be covered Doppler study Followup in 6-8 months

## 2014-02-19 ENCOUNTER — Ambulatory Visit (INDEPENDENT_AMBULATORY_CARE_PROVIDER_SITE_OTHER): Payer: Medicare Other

## 2014-02-19 DIAGNOSIS — I63239 Cerebral infarction due to unspecified occlusion or stenosis of unspecified carotid arteries: Secondary | ICD-10-CM

## 2014-02-21 ENCOUNTER — Telehealth: Payer: Self-pay | Admitting: Nurse Practitioner

## 2014-02-21 NOTE — Telephone Encounter (Signed)
Shared results with patient per Ms Martin's findings,he verbalized understanding

## 2014-02-21 NOTE — Telephone Encounter (Signed)
Please let patient know her carotid doppler study is stable from previous.

## 2014-02-25 ENCOUNTER — Ambulatory Visit (HOSPITAL_BASED_OUTPATIENT_CLINIC_OR_DEPARTMENT_OTHER): Payer: 59 | Admitting: Physical Medicine & Rehabilitation

## 2014-02-25 ENCOUNTER — Encounter: Payer: Medicare Other | Attending: Physical Medicine & Rehabilitation

## 2014-02-25 ENCOUNTER — Encounter: Payer: Self-pay | Admitting: Physical Medicine & Rehabilitation

## 2014-02-25 VITALS — BP 123/76 | HR 76 | Resp 14 | Ht 69.0 in | Wt 230.2 lb

## 2014-02-25 DIAGNOSIS — G8110 Spastic hemiplegia affecting unspecified side: Secondary | ICD-10-CM | POA: Diagnosis not present

## 2014-02-25 DIAGNOSIS — G8114 Spastic hemiplegia affecting left nondominant side: Secondary | ICD-10-CM

## 2014-02-25 NOTE — Patient Instructions (Signed)
Next visit will be for a tibial nerve block, you'll test drive how your foot and ankle feel after the block and we can decide whether we can do a longer lasting injection afterwards

## 2014-02-25 NOTE — Progress Notes (Signed)
Subjective:    Patient ID: Arthur Manges., male    DOB: 12-03-1955, 58 y.o.   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 Overall this seems to be helping with his foot and ankle pain. His walking is somewhat better but the patient still turns his foot outward during ambulation.  Patient and wife deny falls No new issues since last visit January 15, 2014 Still c/o Left shoulder pain Inaddition some fist clenching when wrist /hand orthosis is not applied  Pain Inventory Average Pain 5 Pain Right Now 4 My pain is aching  In the last 24 hours, has pain interfered with the following? General activity 4 Relation with others 4 Enjoyment of life 4 What TIME of day is your pain at its worst? morning evening and night Sleep (in general) Fair  Pain is worse with: inactivity Pain improves with: rest and therapy/exercise Relief from Meds: no pain med  Mobility walk with assistance use a cane ability to climb steps?  yes do you drive?  no  Function retired  Neuro/Psych depression  Prior Studies Any changes since last visit?  no  Physicians involved in your care Any changes since last visit?  no   Family History  Problem Relation Age of Onset  . Heart disease Mother   . Diabetes Mother   . Arthritis Mother   . Heart disease Father   . Diabetes Father   . Stroke Other   . Gout Other   . Coronary artery disease Other    History   Social History  . Marital Status: Married    Spouse Name: Arthur Cisneros    Number of Children: 5  . Years of Education: 8th    Occupational History  .     Social History Main Topics  . Smoking status: Never Smoker   . Smokeless tobacco: Current User    Types: Chew  . Alcohol Use: No  . Drug Use: No  . Sexual Activity: None   Other Topics Concern  . None   Social History Narrative   Patient lives at  home with wife Arthur Cisneros.    Patient has 5 children.    Patient has a 8th grade.    Patient is retired.    Patient is on disability.    Patient is right handed.    Patient drinks caffeine every once in a while.    Past Surgical History  Procedure Laterality Date  . Coronary artery bypass graft      4 graft   Past Medical History  Diagnosis Date  . Hyperlipidemia   . Hypertension   . Stroke   . Diabetes mellitus     states no longer high blood sugars  . Coronary artery disease   . Anxiety    BP 123/76  Pulse 76  Resp 14  Ht 5\' 9"  (1.753 m)  Wt 230 lb 3.2 oz (104.418 kg)  BMI 33.98 kg/m2  SpO2 94%  Opioid Risk Score:   Fall Risk Score: Moderate Fall Risk (6-13 points) (educated and fall handout given at previous vist)  Review of Systems  Musculoskeletal: Positive for gait problem.  Psychiatric/Behavioral: Positive for dysphoric mood.  All other systems reviewed and are negative.      Objective:   Physical Exam   2 minus ankle dorsiflexor plantar flexor  4/5 quadriceps  Evidence of clonus at the ankle  Hyper reflexive patellar reflex left knee   Skin shows no evidence of skin breakdown.  Normal pedal and posterior tibial pulses  Ambulates with left AFO he external he rotates at the hip and advances the foot without discrete heelstrike. No hyperextension at the knee     Assessment & Plan:  1. 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 He's had some benefit from the new AFO however continues to have increased tone in the ankle plantar flexors as well as foot inverters. Recommend tibial nerve block Marcaine to see if this helps correct his gait abnormalities. If this is successful on a short-term basis would recommend phenol neural lysis Patient agrees with plan Wife in agreement as well  Patient will also clarify whether the orthotist has an order for a new shoulder sling

## 2014-03-07 ENCOUNTER — Telehealth: Payer: Self-pay | Admitting: *Deleted

## 2014-03-07 NOTE — Telephone Encounter (Signed)
thx

## 2014-03-07 NOTE — Telephone Encounter (Signed)
Left message for Chilton Greathouse who has reached out to patient about letter of medical necessity for left foot orthosis and left shoulder orthosis on Mr Visconti. She was asking pt to follow up with Dr Lottie Dawson but Aundria Mems was a PA with our office and is no longer with our practice. I requested they refax information needed to attention of Dr Letta Pate or call the office and ask for me to discuss what they need from [place] for this patient.

## 2014-03-28 ENCOUNTER — Ambulatory Visit: Payer: BC Managed Care – PPO | Admitting: Physical Medicine & Rehabilitation

## 2014-04-04 ENCOUNTER — Encounter: Payer: Self-pay | Admitting: Physical Medicine & Rehabilitation

## 2014-04-04 ENCOUNTER — Ambulatory Visit (HOSPITAL_BASED_OUTPATIENT_CLINIC_OR_DEPARTMENT_OTHER): Payer: 59 | Admitting: Physical Medicine & Rehabilitation

## 2014-04-04 ENCOUNTER — Encounter: Payer: Medicare Other | Attending: Physical Medicine & Rehabilitation

## 2014-04-04 VITALS — BP 157/84 | HR 63 | Resp 14 | Ht 69.0 in | Wt 232.0 lb

## 2014-04-04 DIAGNOSIS — G8110 Spastic hemiplegia affecting unspecified side: Secondary | ICD-10-CM | POA: Insufficient documentation

## 2014-04-04 DIAGNOSIS — G5790 Unspecified mononeuropathy of unspecified lower limb: Secondary | ICD-10-CM

## 2014-04-04 DIAGNOSIS — M792 Neuralgia and neuritis, unspecified: Secondary | ICD-10-CM

## 2014-04-04 DIAGNOSIS — G8114 Spastic hemiplegia affecting left nondominant side: Secondary | ICD-10-CM

## 2014-04-04 NOTE — Patient Instructions (Signed)
Nerve block should last the next 12 hours Please compare your walking ability today to your walking ability tomorrow  We also discussed Botox as a possible treatment for your severe left finger and wrist flexor spasticity. A pamphlet was given

## 2014-04-04 NOTE — Progress Notes (Signed)
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  Informed consent was obtained after describing the risks and benefits of the procedure with the patient this includes bleeding bruising and infection as well as medication side effects. The patient elected to proceed and has given written consent. Patient placed in a prone position on the exam table. External DC stimulation was applied to the popliteal space using a nerve stimulator. Plantar flexion twitch was obtained. The popliteal region was prepped with Betadine and then entered with a 22-gauge 40 mm needle electrode under electrical stimulation guidance. Plantar flexion which was obtained and confirmed. Then 51ml of .5% marcaine were injected. The patient tolerated procedure well. Post procedure instructions and followup visit were given.

## 2014-05-09 ENCOUNTER — Ambulatory Visit (HOSPITAL_COMMUNITY)
Admission: RE | Admit: 2014-05-09 | Discharge: 2014-05-09 | Disposition: A | Discharge status: Home / Self Care | Payer: Medicare Other | Source: Ambulatory Visit | Attending: Physical Medicine & Rehabilitation | Admitting: Physical Medicine & Rehabilitation

## 2014-05-09 ENCOUNTER — Ambulatory Visit: Payer: 59 | Admitting: Physical Medicine & Rehabilitation

## 2014-05-09 ENCOUNTER — Encounter: Payer: Self-pay | Admitting: Physical Medicine & Rehabilitation

## 2014-05-09 ENCOUNTER — Encounter: Payer: Medicare Other | Attending: Physical Medicine & Rehabilitation

## 2014-05-09 VITALS — BP 155/80 | HR 60 | Resp 14 | Ht 69.0 in | Wt 232.0 lb

## 2014-05-09 DIAGNOSIS — M25512 Pain in left shoulder: Secondary | ICD-10-CM

## 2014-05-09 DIAGNOSIS — G8110 Spastic hemiplegia affecting unspecified side: Secondary | ICD-10-CM | POA: Diagnosis not present

## 2014-05-09 DIAGNOSIS — M25519 Pain in unspecified shoulder: Secondary | ICD-10-CM | POA: Diagnosis not present

## 2014-05-09 DIAGNOSIS — X58XXXA Exposure to other specified factors, initial encounter: Secondary | ICD-10-CM | POA: Diagnosis not present

## 2014-05-09 DIAGNOSIS — S43036A Inferior dislocation of unspecified humerus, initial encounter: Secondary | ICD-10-CM | POA: Diagnosis not present

## 2014-05-09 NOTE — Patient Instructions (Signed)

## 2014-05-09 NOTE — Progress Notes (Signed)
Botox Injection for spasticity using needle EMG guidance  Dilution: 50 Units/ml Indication: Severe spasticity which interferes with ADL,mobility and/or  hygiene and is unresponsive to medication management and other conservative care Informed consent was obtained after describing risks and benefits of the procedure with the patient. This includes bleeding, bruising, infection, excessive weakness, or medication side effects. A REMS form is on file and signed. Needle: 25 g 2 inch needle electrode Number of units per muscle   FCR50 FCU25 FDS50 FDP25 FPL50  All injections were done after obtaining appropriate EMG activity and after negative drawback for blood. The patient tolerated the procedure well. Post procedure instructions were given. A followup appointment was made.

## 2014-06-09 ENCOUNTER — Encounter: Payer: Self-pay | Admitting: Physical Medicine & Rehabilitation

## 2014-06-09 ENCOUNTER — Encounter: Payer: Medicare Other | Attending: Physical Medicine & Rehabilitation

## 2014-06-09 ENCOUNTER — Ambulatory Visit (HOSPITAL_BASED_OUTPATIENT_CLINIC_OR_DEPARTMENT_OTHER): Payer: 59 | Admitting: Physical Medicine & Rehabilitation

## 2014-06-09 VITALS — BP 154/88 | HR 87 | Resp 16 | Ht 69.0 in | Wt 236.0 lb

## 2014-06-09 DIAGNOSIS — M7522 Bicipital tendinitis, left shoulder: Secondary | ICD-10-CM

## 2014-06-09 DIAGNOSIS — M7520 Bicipital tendinitis, unspecified shoulder: Secondary | ICD-10-CM

## 2014-06-09 DIAGNOSIS — G8110 Spastic hemiplegia affecting unspecified side: Secondary | ICD-10-CM | POA: Insufficient documentation

## 2014-06-09 NOTE — Patient Instructions (Addendum)
Biceps tendon sheath injection performed today. This is to help with left shoulder pain. It may take several days to fully take effect. Injected celestone and lidocaine

## 2014-06-09 NOTE — Progress Notes (Signed)
Bicipital tendon sheath injection  Indication Left  biceps Tenosynovitis which does not respond to conservative care including medications and therapy and interferes with activity causing pain  Informed consent was obtained after describing risks and benefits of the procedure with the patient including bleeding bruising and infection he elects to proceed and has given written consent Patient placed in a supine position palm up with the hand at the side. Area was scant using 12 Hz linear transducer. Biceps tendon just distal to the groove was identified. Area prepped with Betadine. Needle track was anesthetize with 1% lidocaine x2 ML. The tendon sheath was entered and injection with Celestone 6 mg per mL x0.5 mL as well as lidocaine 1% times one ML. Patient tolerated procedure well. Post procedure instructions given.  Return to clinic one month for followup

## 2014-06-10 ENCOUNTER — Other Ambulatory Visit: Payer: Self-pay

## 2014-06-10 MED ORDER — MIRTAZAPINE 30 MG PO TABS: ORAL_TABLET | ORAL | Status: DC

## 2014-06-10 MED ORDER — TIZANIDINE HCL 2 MG PO TABS: ORAL_TABLET | ORAL | Status: DC

## 2014-07-01 ENCOUNTER — Ambulatory Visit: Payer: 59 | Admitting: Physical Medicine & Rehabilitation

## 2014-07-14 ENCOUNTER — Encounter: Payer: Self-pay | Admitting: Physical Medicine & Rehabilitation

## 2014-07-14 ENCOUNTER — Ambulatory Visit (HOSPITAL_BASED_OUTPATIENT_CLINIC_OR_DEPARTMENT_OTHER): Payer: Medicare Other | Admitting: Physical Medicine & Rehabilitation

## 2014-07-14 ENCOUNTER — Encounter: Payer: Medicare Other | Attending: Physical Medicine & Rehabilitation

## 2014-07-14 VITALS — BP 142/84 | HR 70 | Resp 14 | Ht 69.0 in | Wt 234.0 lb

## 2014-07-14 DIAGNOSIS — M545 Low back pain, unspecified: Secondary | ICD-10-CM

## 2014-07-14 DIAGNOSIS — G8110 Spastic hemiplegia affecting unspecified side: Secondary | ICD-10-CM | POA: Insufficient documentation

## 2014-07-14 DIAGNOSIS — G8929 Other chronic pain: Secondary | ICD-10-CM

## 2014-07-14 DIAGNOSIS — R52 Pain, unspecified: Secondary | ICD-10-CM

## 2014-07-14 DIAGNOSIS — G8114 Spastic hemiplegia affecting left nondominant side: Secondary | ICD-10-CM

## 2014-07-14 MED ORDER — TIZANIDINE HCL 2 MG PO TABS: 2.0000 mg | ORAL_TABLET | Freq: Two times a day (BID) | ORAL | Status: DC

## 2014-07-14 MED ORDER — TIZANIDINE HCL 2 MG PO TABS: ORAL_TABLET | ORAL | Status: DC

## 2014-07-14 NOTE — Patient Instructions (Signed)
Try heating pad to low back 20-30 minutes twice a day

## 2014-07-14 NOTE — Progress Notes (Signed)
Subjective:    Patient ID: Arthur Cisneros., male    DOB: 10/01/1956, 58 y.o.   MRN: [id number]  HPI Left shoulder pain improved after bicipital tendon sheath injection under [place] 06/09/14 Left finger and wrist flexor spasms improved since botox 05/09/14 Left foot and ankle equinovarus spasticity mildy improved since tibial nerve block with phenol  Low back pain radiating into Both legs, takes tylenolES  4 tabs per day  Had hx of Low back problems prior to CVA  Pain Inventory Average Pain 5 Pain Right Now 5 My pain is aching  In the last 24 hours, has pain interfered with the following? General activity 4 Relation with others 3 Enjoyment of life 5 What TIME of day is your pain at its worst? moring, night Sleep (in general) Poor  Pain is worse with: walking, sitting, inactivity and standing Pain improves with: rest, therapy/exercise and medication Relief from Meds: no pain meds  Mobility walk with assistance use a cane how many minutes can you walk? 60-120 ability to climb steps?  yes do you drive?  no transfers alone Do you have any goals in this area?  yes  Function retired I need assistance with the following:  dressing, bathing and meal prep Do you have any goals in this area?  yes  Neuro/Psych weakness depression anxiety  Prior Studies Any changes since last visit?  no  Physicians involved in your care Any changes since last visit?  no   Family History  Problem Relation Age of Onset  . Heart disease Mother   . Diabetes Mother   . Arthritis Mother   . Heart disease Father   . Diabetes Father   . Stroke Other   . Gout Other   . Coronary artery disease Other    History   Social History  . Marital Status: Married    Spouse Name: Arthur Cisneros    Number of Children: 5  . Years of Education: 8th    Occupational History  .     Social History Main Topics  . Smoking status: Never Smoker   . Smokeless tobacco: Current User    Types: Chew  . Alcohol  Use: No  . Drug Use: No  . Sexual Activity: None   Other Topics Concern  . None   Social History Narrative   Patient lives at home with wife Arthur Cisneros.    Patient has 5 children.    Patient has a 8th grade.    Patient is retired.    Patient is on disability.    Patient is right handed.    Patient drinks caffeine every once in a while.    Past Surgical History  Procedure Laterality Date  . Coronary artery bypass graft      4 graft   Past Medical History  Diagnosis Date  . Hyperlipidemia   . Hypertension   . Stroke   . Diabetes mellitus     states no longer high blood sugars  . Coronary artery disease   . Anxiety    BP 142/84  Pulse 70  Resp 14  Ht 5\' 9"  (1.753 m)  Wt 234 lb (106.142 kg)  BMI 34.54 kg/m2  SpO2 96%  Opioid Risk Score:   Fall Risk Score: Moderate Fall Risk (6-13 points) (pt educated, declined handout)   Review of Systems  Neurological: Positive for weakness.  Psychiatric/Behavioral: The patient is nervous/anxious.        Depression  All other systems reviewed and are  negative.      Objective:   Physical Exam  Nursing note and vitals reviewed. Constitutional: He is oriented to person, place, and time. He appears well-developed and well-nourished.  HENT:  Head: Normocephalic and atraumatic.  Eyes: Conjunctivae and EOM are normal. Pupils are equal, round, and reactive to light.  Neck: Normal range of motion.  Musculoskeletal:       Lumbar back: He exhibits tenderness and spasm.  Reduce int rotation left hip   Neurological: He is alert and oriented to person, place, and time. He has normal reflexes.  Psychiatric: He has a normal mood and affect.   0/5 strength in the left upper extremity 3 minus left hip flexor knee extensor trace ankle dorsiflexor  Right side with normal strength Negative straight leg raising test bilaterally Tenderness palpation left lumbar paraspinals      Assessment & Plan:  1. Low back pain without clear cut  radicular symptoms. He does have some lower extremity pain with this however no exam findings. History of chronic low back has not had problems with this recently. Exam is consistent with myofascial discomfort left side of lumbar paraspinals. Will increase and flex to twice a day, encourage heating pad., Unfortunately I think the main issue is left hemiparesis that is altering his gait pattern , will check x-rays see if he has underlying spondylosis Consider medial branch blocks depending on x-ray results  2. Left spastic hemiplegia still with good results following Botox injection left upper extremity will monitor for return of severe spasticity.  #3. Left bicipital tendinitis overall improved no pain with range of motion

## 2014-08-12 ENCOUNTER — Ambulatory Visit (HOSPITAL_COMMUNITY)
Admission: RE | Admit: 2014-08-12 | Discharge: 2014-08-12 | Disposition: A | Discharge status: Home / Self Care | Payer: Medicare Other | Source: Ambulatory Visit | Attending: Physical Medicine & Rehabilitation | Admitting: Physical Medicine & Rehabilitation

## 2014-08-12 DIAGNOSIS — M545 Low back pain, unspecified: Secondary | ICD-10-CM | POA: Diagnosis present

## 2014-08-12 DIAGNOSIS — G8929 Other chronic pain: Secondary | ICD-10-CM | POA: Insufficient documentation

## 2014-08-12 DIAGNOSIS — R52 Pain, unspecified: Secondary | ICD-10-CM | POA: Diagnosis not present

## 2014-08-29 ENCOUNTER — Ambulatory Visit (HOSPITAL_BASED_OUTPATIENT_CLINIC_OR_DEPARTMENT_OTHER): Payer: Medicare Other | Admitting: Physical Medicine & Rehabilitation

## 2014-08-29 ENCOUNTER — Encounter: Payer: Medicare Other | Attending: Physical Medicine & Rehabilitation

## 2014-08-29 ENCOUNTER — Telehealth: Payer: Self-pay | Admitting: Nurse Practitioner

## 2014-08-29 ENCOUNTER — Encounter: Payer: Self-pay | Admitting: Physical Medicine & Rehabilitation

## 2014-08-29 VITALS — BP 153/80 | HR 64 | Resp 14 | Ht 69.0 in | Wt 232.8 lb

## 2014-08-29 DIAGNOSIS — M47818 Spondylosis without myelopathy or radiculopathy, sacral and sacrococcygeal region: Secondary | ICD-10-CM

## 2014-08-29 DIAGNOSIS — Z79899 Other long term (current) drug therapy: Secondary | ICD-10-CM | POA: Diagnosis not present

## 2014-08-29 DIAGNOSIS — Z5181 Encounter for therapeutic drug level monitoring: Secondary | ICD-10-CM | POA: Diagnosis not present

## 2014-08-29 DIAGNOSIS — G8110 Spastic hemiplegia affecting unspecified side: Secondary | ICD-10-CM | POA: Insufficient documentation

## 2014-08-29 DIAGNOSIS — M47816 Spondylosis without myelopathy or radiculopathy, lumbar region: Secondary | ICD-10-CM | POA: Diagnosis not present

## 2014-08-29 DIAGNOSIS — M47897 Other spondylosis, lumbosacral region: Secondary | ICD-10-CM | POA: Insufficient documentation

## 2014-08-29 DIAGNOSIS — M25512 Pain in left shoulder: Secondary | ICD-10-CM | POA: Diagnosis not present

## 2014-08-29 MED ORDER — ACETAMINOPHEN-CODEINE #3 300-30 MG PO TABS: 1.0000 | ORAL_TABLET | Freq: Three times a day (TID) | ORAL | Status: DC | PRN

## 2014-08-29 NOTE — Patient Instructions (Signed)
Start taking aspirin one tablet per day in place of Plavix the week before your spine injection

## 2014-08-29 NOTE — Telephone Encounter (Signed)
Left message for patient regarding rescheduling 09/11/14 appointment per Carolyn's schedule.

## 2014-08-29 NOTE — Progress Notes (Signed)
Subjective:    Patient ID: Arthur Manges., male    DOB: Sep 10, 1956, 58 y.o.   MRN: [id number]  HPI Left shoulder pain improved after bicipital tendon sheath injection under [place] 06/09/14  Left finger and wrist flexor spasms improved since botox 05/09/14  Left foot and ankle equinovarus spasticity mildy improved since tibial nerve block with phenol  Low back pain radiating into Both legs, takes tylenolES 4 tabs per day  Had hx of Low back problems prior to CVA  No numbness in the right leg, has some chronic sensory on the left side secondary to stroke Pain Inventory Average Pain 5 Pain Right Now 5 My pain is intermittent, tingling and aching  In the last 24 hours, has pain interfered with the following? General activity 5 Relation with others 5 Enjoyment of life 4 What TIME of day is your pain at its worst? morning and night Sleep (in general) Good  Pain is worse with: walking, sitting, inactivity and standing Pain improves with: rest and medication Relief from Meds: 5  Mobility use a cane how many minutes can you walk? 1 to 2 hours ability to climb steps?  yes do you drive?  no  Function retired  Neuro/Psych tremor tingling depression anxiety  Prior Studies Any changes since last visit?  no Reviewed x-rays lumbar spine 08/12/2014 Evidence of anterior osteophyte formation multilevel lumbar spine Lumbar facet arthropathy L4-5 L5-S1 as well as left greater than right sacroiliac degenerative changes Physicians involved in your care Any changes since last visit?  no   Family History  Problem Relation Age of Onset  . Heart disease Mother   . Diabetes Mother   . Arthritis Mother   . Heart disease Father   . Diabetes Father   . Stroke Other   . Gout Other   . Coronary artery disease Other    History   Social History  . Marital Status: Married    Spouse Name: Arthur Cisneros    Number of Children: 5  . Years of Education: 8th    Occupational History  .      Social History Main Topics  . Smoking status: Never Smoker   . Smokeless tobacco: Current User    Types: Chew  . Alcohol Use: No  . Drug Use: No  . Sexual Activity: None   Other Topics Concern  . None   Social History Narrative   Patient lives at home with wife Arthur Cisneros.    Patient has 5 children.    Patient has a 8th grade.    Patient is retired.    Patient is on disability.    Patient is right handed.    Patient drinks caffeine every once in a while.    Past Surgical History  Procedure Laterality Date  . Coronary artery bypass graft      4 graft   Past Medical History  Diagnosis Date  . Hyperlipidemia   . Hypertension   . Stroke   . Diabetes mellitus     states no longer high blood sugars  . Coronary artery disease   . Anxiety    BP 153/80  Pulse 64  Resp 14  Ht 5\' 9"  (1.753 m)  Wt 232 lb 12.8 oz (105.597 kg)  BMI 34.36 kg/m2  SpO2 96%  Opioid Risk Score:   Fall Risk Score: Low Fall Risk (0-5 points)   Review of Systems     Objective:   Physical Exam  Nursing note and vitals reviewed.  Constitutional: He is oriented to person, place, and time. He appears well-developed and well-nourished.  HENT:  Head: Normocephalic and atraumatic.  Eyes: Conjunctivae and EOM are normal. Pupils are equal, round, and reactive to light.  Neck: Normal range of motion.  Musculoskeletal:  Lumbar back: He exhibits tenderness and spasm.  Reduce int rotation left hip  Neurological: He is alert and oriented to person, place, and time. He has normal reflexes.  Psychiatric: He has a normal mood and affect.   0/5 strength in the left upper extremity  3 minus left hip flexor knee extensor trace ankle dorsiflexor  Right side with normal strength  Negative straight leg raising test bilaterally  Tenderness palpation left lumbar paraspinals  Pain with extension of the lumbar spine greater than with flexion  Ashworth grade 2-3 spasticity in the finger flexors of right  hand Ashworth grade 3-4 spasticity and plantar flexor       Assessment & Plan:  1. Right MCA infarct with left spastic hemiplegia will likely need Botox reinjection of the left wrist and finger flexors as well as repeat left tibial nerve block however patient's primary concern at this point is low back pain  2. Lumbar spondylosis is most likely etiology of his low back pain. Also has some sacroiliac degeneration. Does not tolerate tramadol secondary to insomnia, not having adequate relief with Tylenol, not a good candidate for nonsteroidal anti-inflammatories secondary to Plavix Recommend Tylenol #3 one by mouth 3 times a day we'll check urine drug screen, will have patient sign opioid consent in controlled substance agreement Schedule for medial branch blocks we'll need to switch from Plavix to aspirin for 7 days prior to the injection

## 2014-09-09 ENCOUNTER — Ambulatory Visit (INDEPENDENT_AMBULATORY_CARE_PROVIDER_SITE_OTHER): Payer: Medicare Other | Admitting: Neurology

## 2014-09-09 ENCOUNTER — Encounter: Payer: Self-pay | Admitting: Neurology

## 2014-09-09 VITALS — BP 144/92 | HR 70 | Ht 69.0 in | Wt 234.0 lb

## 2014-09-09 DIAGNOSIS — R519 Headache, unspecified: Secondary | ICD-10-CM | POA: Insufficient documentation

## 2014-09-09 DIAGNOSIS — R51 Headache: Secondary | ICD-10-CM

## 2014-09-09 DIAGNOSIS — G441 Vascular headache, not elsewhere classified: Secondary | ICD-10-CM

## 2014-09-09 MED ORDER — TOPIRAMATE 50 MG PO TABS: 50.0000 mg | ORAL_TABLET | Freq: Two times a day (BID) | ORAL | Status: DC

## 2014-09-09 NOTE — Patient Instructions (Signed)
I had a long discussion with the patient and his wife with regards to his intermittent occipital neurologic headaches. Trial of Topamax 50 mg at night for one week increase if tolerated to twice daily. Continue Plavix for stroke prevention but patient may hold the Plavix 5 days prior to his epidural back injection with a small but acceptable risk of periprocedural stroke. It has been more than 4 years since his last stroke. Continue Keppra for seizure prophylaxis. Return for followup in 3 months with Cecille Rubin, nurse practitioner call earlier if necessary

## 2014-09-10 NOTE — Progress Notes (Signed)
[facility]  PATIENT: Arthur Cisneros. DOB: Jan 23, 1956   REASON FOR VISIT: Followup for history of stroke and seizures   HISTORY OF PRESENT ILLNESS:Arthur Cisneros, 58 year old male returns for followup. He has a history of right MCA infarct in 2011 and also isolated seizure with secondary generalization in March of 2012 likely has late effect of his stroke. He remains on Keppra without further seizure activity, and he is on Plavix with minimal bruising. He continues to wear her brace on the left lower leg and on his left arm. He is due for Doppler study. He denies any double vision, increased focal weakness, speech or swallowing difficulty, blackouts or confusion. He continues to walk around the house for exercise. He returns for reevaluation.   HISTORY: of right MCA infarct occurred August 2011. He had isolated partial seizure with secondary generalization in March 2012 likely as late effects from stroke. He has vascular risk factors of hypertension, hyperlipidemia, coronary artery disease and carotid occlusion. He says recent labs were good done at primary care. Blood pressure has been in good control but is slightly elevated today at 138/92. He denies double vision, loss of vision, focal weakness, numbness, speech or swallowing problems, confusions, blackouts. He continues to wear a brace on the left lower leg. Recent carotid Doppler studies without change from previous year.  Update 09/09/2014 : he returns for followup of her loss of the third 6 months ago. He has occasional right parieto-occipital headaches off and on. He can sometimes wake up from sleep with a sharp throbbing headache but this last only 10-15 seconds. He feels tired and rundown after the headaches. The headache is not a complaint of nausea vomiting light or sound sensitivity. Or does not help his headaches. This occurs about once or twice a day now. Is not quite specific medications for this. He has not had any  for seizures and remains on Keppra which is tolerating well without side effects. He had a followup carotid ultrasound done on 02/19/14 which I personally reviewed and shows no significant extracranial stenosis. Patient is having chronic back pain and wants to have epidural steroid injection and wants to hold Plavix for 5 days prior to the procedure. 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 atorvastatin (LIPITOR) 40 MG tablet Take 40 mg by mouth daily.      . clopidogrel (PLAVIX) 75 MG tablet Take 1 tablet (75 mg total) by mouth daily.  30 tablet  8  . diclofenac sodium (VOLTAREN) 1 % GEL Apply 1 application topically 4 (four) times daily.      Marland Kitchen levETIRAcetam (KEPPRA) 250 MG tablet Take 1 tablet (250 mg total) by mouth 2 (two) times daily.  60 tablet  8  . losartan (COZAAR) 25 MG tablet Take 25 mg by mouth daily.      . mirtazapine (REMERON) 30 MG tablet TAKE 1 TABLET AT BEDTIME.  30 tablet  5  . nitroGLYCERIN (NITROSTAT) 0.4 MG SL tablet Place 0.4 mg under the tongue every 5 (five) minutes as needed.      . ranitidine (ZANTAC) 150 MG tablet Take 150 mg by mouth as needed for heartburn.      Marland Kitchen  tiZANidine (ZANAFLEX) 2 MG tablet Take 1 tablet (2 mg total) by mouth 2 (two) times daily. TAKE ONE TABLET AT BED-TIME WHEN NEEDED.  60 tablet  3  . acetaminophen-codeine (TYLENOL #3) 300-30 MG per tablet Take 1 tablet by mouth every 8 (eight) hours as needed for moderate pain or severe pain.  90 tablet  0  . docusate sodium (COLACE) 100 MG capsule Take 100 mg by mouth as needed.       No facility-administered medications prior to visit.     PAST MEDICAL HISTORY: Past Medical History  Diagnosis Date  . Hyperlipidemia   . Hypertension   . Stroke   . Diabetes mellitus     states no longer high blood sugars  . Coronary artery disease   . Anxiety     PAST SURGICAL HISTORY: Past Surgical History  Procedure Laterality Date  . Coronary artery bypass graft      4 graft    FAMILY HISTORY: Family History  Problem Relation Age of Onset  . Heart disease Mother   . Diabetes Mother   . Arthritis Mother   . Heart disease Father   . Diabetes Father   . Stroke Other   . Gout Other   . Coronary artery disease Other     SOCIAL HISTORY: History   Social History  . Marital Status: Married    Spouse Name: Arthur Cisneros    Number of Children: 5  . Years of Education: 8th    Occupational History  .     Social History Main Topics  . Smoking status: Never Smoker   . Smokeless tobacco: Current User    Types: Chew  . Alcohol Use: No  . Drug Use: No  . Sexual Activity: Not on file   Other Topics Concern  . Not on file   Social History Narrative   Patient lives at home with wife Arthur Cisneros.    Patient has 5 children.    Patient has a 8th grade.    Patient is retired.    Patient is on disability.    Patient is right handed.    Patient drinks caffeine every once in a while.      PHYSICAL EXAM  Filed Vitals:   09/09/14 1305  BP: 144/92  Pulse: 70  Height: 5\' 9"  (1.753 m)  Weight: 234 lb (106.142 kg)   Body mass index is 34.54 kg/(m^2).  Generalized: Well developed middle aged 78 male, in no acute distress  Head: normocephalic and atraumatic,. Oropharynx benign  Neck: Supple, no carotid bruits ACE inhibitor therapy was not prescribed due to . Scalp tenderness. Cardiac: Regular rate rhythm, no murmur  Musculoskeletal: AFO to left leg, brace to left arm Neurological examination   Mentation: Alert oriented to time, place, history taking. Follows all commands speech and language fluent  Cranial nerve  II-XII: Fundoscopic exam reveals sharp disc margins.Pupils were equal round reactive to light extraocular movements were full, visual field with dense left hemianopsia . Mild left lower facial weakness Hearing was intact to finger rubbing bilaterally. Uvula tongue midline. head turning and shoulder shrug were normal and symmetric.Tongue protrusion into cheek strength was normal. Motor: Spastic left hemiplegia with 1/5 upper extremity and 0/5 distal weakness. Left lower extremity is 4/5 and 1/5 ankle dorsiflexion and footdrop. Sensory: Intact to primary modalities on the right, decreased on the left Coordination: finger-nose-finger, heel-to-shin bilaterally, normal on the right unable to perform on the left  Reflexes: Brisker on the left, 1+ on the  right Gait and Station: Gait hemiparetic uses a cane, drags his left foot DIAGNOSTIC DATA (LABS, IMAGING, TESTING) - ASSESSMENT AND PLAN  58 y.o. year old male  has a past medical history of Hyperlipidemia; Hypertension; Stroke; Diabetes mellitus; Coronary artery disease; seizure disorder and Anxiety. here to followup. He has not had further seizure activity.New right parietal occipital sharp neurologic transient headaches.  I had a long discussion with the patient and his wife with regards to his intermittent occipital neurologic headaches. Trial of Topamax 50 mg at night for one week increase if tolerated to twice daily. Continue Plavix for stroke prevention but patient may hold the Plavix 5 days prior to his epidural back injection with a small but acceptable risk of periprocedural stroke. It has been more than 4 years since his last stroke. Continue Keppra for seizure prophylaxis. Return for followup in 3 months with Cecille Rubin, nurse practitioner call earlier if necessary Antony Contras, MD [facility] [address], [place], [place] [address] [phone]

## 2014-09-11 ENCOUNTER — Ambulatory Visit: Payer: Medicare Other | Admitting: Nurse Practitioner

## 2014-09-26 ENCOUNTER — Encounter: Payer: Medicare Other | Attending: Physical Medicine & Rehabilitation

## 2014-09-26 ENCOUNTER — Ambulatory Visit: Payer: Medicare Other | Admitting: Physical Medicine & Rehabilitation

## 2014-09-26 DIAGNOSIS — Z79899 Other long term (current) drug therapy: Secondary | ICD-10-CM | POA: Insufficient documentation

## 2014-09-26 DIAGNOSIS — M47897 Other spondylosis, lumbosacral region: Secondary | ICD-10-CM | POA: Insufficient documentation

## 2014-09-26 DIAGNOSIS — M25512 Pain in left shoulder: Secondary | ICD-10-CM | POA: Insufficient documentation

## 2014-09-26 DIAGNOSIS — Z5181 Encounter for therapeutic drug level monitoring: Secondary | ICD-10-CM | POA: Insufficient documentation

## 2014-09-26 DIAGNOSIS — M47816 Spondylosis without myelopathy or radiculopathy, lumbar region: Secondary | ICD-10-CM | POA: Insufficient documentation

## 2014-09-26 DIAGNOSIS — G8110 Spastic hemiplegia affecting unspecified side: Secondary | ICD-10-CM | POA: Insufficient documentation

## 2014-10-15 ENCOUNTER — Other Ambulatory Visit: Payer: Self-pay | Admitting: Physical Medicine & Rehabilitation

## 2014-11-13 ENCOUNTER — Other Ambulatory Visit: Payer: Self-pay | Admitting: Nurse Practitioner

## 2014-11-14 DIAGNOSIS — C61 Malignant neoplasm of prostate: Secondary | ICD-10-CM

## 2014-11-14 HISTORY — DX: Malignant neoplasm of prostate: C61

## 2014-12-02 ENCOUNTER — Encounter: Payer: Self-pay | Admitting: Nurse Practitioner

## 2014-12-02 ENCOUNTER — Ambulatory Visit (INDEPENDENT_AMBULATORY_CARE_PROVIDER_SITE_OTHER): Payer: 59 | Admitting: Nurse Practitioner

## 2014-12-02 VITALS — BP 150/97 | HR 73 | Temp 97.3°F | Ht 69.0 in | Wt 236.0 lb

## 2014-12-02 DIAGNOSIS — G8110 Spastic hemiplegia affecting unspecified side: Secondary | ICD-10-CM

## 2014-12-02 DIAGNOSIS — M47816 Spondylosis without myelopathy or radiculopathy, lumbar region: Secondary | ICD-10-CM

## 2014-12-02 DIAGNOSIS — G8114 Spastic hemiplegia affecting left nondominant side: Secondary | ICD-10-CM

## 2014-12-02 DIAGNOSIS — R5601 Complex febrile convulsions: Secondary | ICD-10-CM

## 2014-12-02 DIAGNOSIS — I63211 Cerebral infarction due to unspecified occlusion or stenosis of right vertebral arteries: Secondary | ICD-10-CM

## 2014-12-02 DIAGNOSIS — G441 Vascular headache, not elsewhere classified: Secondary | ICD-10-CM

## 2014-12-02 DIAGNOSIS — I6322 Cerebral infarction due to unspecified occlusion or stenosis of basilar arteries: Secondary | ICD-10-CM

## 2014-12-02 MED ORDER — TIZANIDINE HCL 2 MG PO TABS: ORAL_TABLET | ORAL | Status: DC

## 2014-12-02 MED ORDER — LEVETIRACETAM 250 MG PO TABS: ORAL_TABLET | ORAL | Status: DC

## 2014-12-02 MED ORDER — CLOPIDOGREL BISULFATE 75 MG PO TABS: 75.0000 mg | ORAL_TABLET | Freq: Every day | ORAL | Status: DC

## 2014-12-02 NOTE — Patient Instructions (Addendum)
Continue Keppra at current dose for seizure prophylaxis will refill Continue Plavix at current dose for secondary stroke prevention will refill Will set up for carotid Doppler in April and compare to previous April 2015 B/P 150/97 try to keep systolic less than 677 Continue Tizanidine for muscle spasms Wear brace to the leg when away from home, use cane for gait stability F/U in 6 months

## 2014-12-02 NOTE — Progress Notes (Signed)
[facility]  PATIENT: Arthur Cisneros. DOB: 1956-04-26   REASON FOR VISIT: Follow-up for history of right MCA infarct and seizure disorder HISTORY FROM: Patient and wife    HISTORY OF PRESENT ILLNESS: UPDATE 12/02/14 [name], 59 year old male returns for followup. He has a history of right MCA infarct in 2011 and also isolated seizure with secondary generalization in March of 2012 likely has late effect of his stroke. He remains on Keppra without further seizure activity, and he is on Plavix with minimal bruising. He continues to wear AFO brace on the left lower leg and on his left arm. Doppler study April 2015 without change from the previous year.  He denies any double vision, increased focal weakness, speech or swallowing difficulty, blackouts or confusion. He continues to walk around the house for exercise.he uses a single-point cane . His right parietal occipital transient headaches are improved at present. He stopped his Topamax  He returns for reevaluation.   HISTORY: of right MCA infarct occurred August 2011. He had isolated partial seizure with secondary generalization in March 2012 likely as late effects from stroke. He has vascular risk factors of hypertension, hyperlipidemia, coronary artery disease and carotid occlusion. He says recent labs were good done at primary care. Blood pressure has been in good control but is slightly elevated today at 138/92. He denies double vision, loss of vision, focal weakness, numbness, speech or swallowing problems, confusions, blackouts. He continues to wear a brace on the left lower leg. Recent carotid Doppler studies without change from previous year.  Update 09/09/2014 : he returns for followup of her loss of the third 6 months ago. He has occasional right parieto-occipital headaches off and on. He can sometimes wake up from sleep with a sharp throbbing headache but this last only 10-15 seconds. He feels tired and rundown  after the headaches. The headache is not a complaint of nausea vomiting light or sound sensitivity. Or does not help his headaches. This occurs about once or twice a day now. Is not quite specific medications for this. He has not had any for seizures and remains on Keppra which is tolerating well without side effects. He had a followup carotid ultrasound done on 02/19/14 which I personally reviewed and shows no significant extracranial stenosis. Patient is having chronic back pain and wants to have epidural steroid injection and wants to hold Plavix for 5 days prior to the procedure. He continued to have spastic left hemiplegia and is able to ambulate independently.Marland Kitchen  REVIEW OF SYSTEMS: Full 14 system review of systems performed and notable only for those listed, all others are neg:  Constitutional: neg  Cardiovascular: neg Ear/Nose/Throat: neg  Skin: neg Eyes: neg Respiratory: neg Gastroitestinal: neg  Hematology/Lymphatic: neg  Endocrine: neg Musculoskeletal:gait abnormality Allergy/Immunology: neg Neurological weakness occasional headache Psych Depression Sleep : neg   ALLERGIES: No Known Allergies  HOME MEDICATIONS: Outpatient Prescriptions Prior to Visit  Medication Sig Dispense Refill  . atorvastatin (LIPITOR) 40 MG tablet Take 40 mg by mouth daily.    . clopidogrel (PLAVIX) 75 MG tablet Take 1 tablet (75 mg total) by mouth daily. 30 tablet 8  . levETIRAcetam (KEPPRA) 250 MG tablet TAKE (1) TABLET TWICE A DAY. 60 tablet 3  . losartan (COZAAR) 25 MG tablet Take 25 mg by mouth daily.    . mirtazapine (REMERON) 30 MG tablet TAKE 1 TABLET AT BEDTIME. 30 tablet 5  . nitroGLYCERIN (NITROSTAT) 0.4 MG SL tablet Place 0.4 mg under the  tongue every 5 (five) minutes as needed.    . ranitidine (ZANTAC) 150 MG tablet Take 150 mg by mouth as needed for heartburn.    Marland Kitchen tiZANidine (ZANAFLEX) 2 MG tablet TAKE ONE TABLET AT BED-TIME WHEN NEEDED. 30 tablet 2  . acetaminophen-codeine (TYLENOL #3)  300-30 MG per tablet Take 1 tablet by mouth every 8 (eight) hours as needed for moderate pain or severe pain. (Patient not taking: Reported on 12/02/2014) 90 tablet 0  . diclofenac sodium (VOLTAREN) 1 % GEL Apply 1 application topically 4 (four) times daily.    Marland Kitchen tiZANidine (ZANAFLEX) 2 MG tablet Take 1 tablet (2 mg total) by mouth 2 (two) times daily. TAKE ONE TABLET AT BED-TIME WHEN NEEDED. (Patient not taking: Reported on 12/02/2014) 60 tablet 3  . topiramate (TOPAMAX) 50 MG tablet Take 1 tablet (50 mg total) by mouth 2 (two) times daily. Start 50 mg at night x 1 week then twice daily (Patient not taking: Reported on 12/02/2014) 60 tablet 2   No facility-administered medications prior to visit.    PAST MEDICAL HISTORY: Past Medical History  Diagnosis Date  . Hyperlipidemia   . Hypertension   . Stroke   . Diabetes mellitus     states no longer high blood sugars  . Coronary artery disease   . Anxiety     PAST SURGICAL HISTORY: Past Surgical History  Procedure Laterality Date  . Coronary artery bypass graft      4 graft    FAMILY HISTORY: Family History  Problem Relation Age of Onset  . Heart disease Mother   . Diabetes Mother   . Arthritis Mother   . Heart disease Father   . Diabetes Father   . Stroke Other   . Gout Other   . Coronary artery disease Other     SOCIAL HISTORY: History   Social History  . Marital Status: Married    Spouse Name: [name]    Number of Children: 5  . Years of Education: 8th    Occupational History  .     Social History Main Topics  . Smoking status: Never Smoker   . Smokeless tobacco: Current User    Types: Chew  . Alcohol Use: No  . Drug Use: No  . Sexual Activity: Not on file   Other Topics Concern  . Not on file   Social History Narrative   Patient lives at home with wife Arthur Cisneros.    Patient has 5 children.    Patient has a 8th grade.    Patient is retired.    Patient is on disability.    Patient is right handed.    Patient  drinks caffeine every once in a while.      PHYSICAL EXAM  Filed Vitals:   12/02/14 1542  BP: 150/97  Pulse: 73  Temp: 97.3 F (36.3 C)  TempSrc: Oral  Height: 5\' 9"  (1.753 m)  Weight: 236 lb (107.049 kg)   Body mass index is 34.84 kg/(m^2). Generalized: Well developed middle aged 31 male, in no acute distress  Head: normocephalic and atraumatic,. Oropharynx benign  Neck: Supple, no carotid bruits . Cardiac: Regular rate rhythm, no murmur  Lungs: Clear to auscultation  Musculoskeletal: AFO to left leg, brace to left arm Neurological examination   Mentation: Alert oriented to time, place, history taking. Follows all commands speech and language fluent  Cranial nerve II-XII: Fundoscopic exam reveals sharp disc margins.Pupils were equal round reactive to light extraocular movements were full,  visual field with dense left hemianopsia . Mild left lower facial weakness Hearing was intact to finger rubbing bilaterally. Uvula tongue midline. head turning and shoulder shrug were normal and symmetric.Tongue protrusion into cheek strength was normal. Motor: Spastic left hemiplegia with 1/5 upper extremity and 0/5 distal weakness. Left lower extremity is 4/5 and 1/5 ankle dorsiflexion and footdrop. Sensory: Intact to primary modalities on the right, decreased on the left Coordination: finger-nose-finger, heel-to-shin bilaterally, normal on the right unable to perform on the left  Reflexes: Brisker on the left, 1+ on the right Gait and Station: Gait hemiparetic uses a cane, drags his left foot  DIAGNOSTIC DATA (LABS, IMAGING, TESTING) - I reviewed patient records, labs, notes, testing and imaging myself where available.  ASSESSMENT AND PLAN  59 y.o. year old male  has a past medical history of Hyperlipidemia; Hypertension; Stroke; Diabetes mellitus; Coronary artery disease; and Anxiety. To follow-up. Seizure disorder in excellent control with Keppra. No further stroke symptoms    Continue Keppra at current dose for seizure prophylaxis will refill Continue Plavix at current dose for secondary stroke prevention will refill Will set up for carotid Doppler in April and compare to previous April 2015 B/P 150/97 try to keep systolic less than 160 Continue Tizanidine for muscle spasms Wear brace to the leg when away from home, use cane for gait stability F/U in 6 months  Dennie Bible, [facility], [facility], APRN  [facility] [address], [place],  [address] [phone] The best medicines that we have her headache on

## 2014-12-05 NOTE — Progress Notes (Signed)
I agree with the above plan 

## 2014-12-12 ENCOUNTER — Other Ambulatory Visit: Payer: Self-pay | Admitting: Physical Medicine & Rehabilitation

## 2015-02-25 ENCOUNTER — Telehealth: Payer: Self-pay | Admitting: Nurse Practitioner

## 2015-02-25 DIAGNOSIS — I63211 Cerebral infarction due to unspecified occlusion or stenosis of right vertebral arteries: Secondary | ICD-10-CM

## 2015-02-25 DIAGNOSIS — I6322 Cerebral infarction due to unspecified occlusion or stenosis of basilar arteries: Principal | ICD-10-CM

## 2015-02-25 NOTE — Telephone Encounter (Signed)
Called patient and left message doppler orders are in someone will be calling to schedule.

## 2015-03-19 ENCOUNTER — Ambulatory Visit (INDEPENDENT_AMBULATORY_CARE_PROVIDER_SITE_OTHER): Payer: Medicare Other

## 2015-03-19 DIAGNOSIS — I6322 Cerebral infarction due to unspecified occlusion or stenosis of basilar arteries: Secondary | ICD-10-CM | POA: Diagnosis not present

## 2015-03-19 DIAGNOSIS — I63211 Cerebral infarction due to unspecified occlusion or stenosis of right vertebral arteries: Secondary | ICD-10-CM

## 2015-03-26 ENCOUNTER — Telehealth: Payer: Self-pay | Admitting: Nurse Practitioner

## 2015-03-26 NOTE — Telephone Encounter (Signed)
Carotid doppler study has not changed and is stable from previous one year ago. Please call the patient

## 2015-03-27 NOTE — Telephone Encounter (Signed)
Spoke with Pt to inform him of carotid doppler results, he was also informed of next scheduled appointment.  Thanks

## 2015-05-15 DIAGNOSIS — C61 Malignant neoplasm of prostate: Secondary | ICD-10-CM

## 2015-05-15 HISTORY — DX: Malignant neoplasm of prostate: C61

## 2015-05-19 DIAGNOSIS — I25810 Atherosclerosis of coronary artery bypass graft(s) without angina pectoris: Secondary | ICD-10-CM | POA: Insufficient documentation

## 2015-05-19 DIAGNOSIS — R972 Elevated prostate specific antigen [PSA]: Secondary | ICD-10-CM | POA: Insufficient documentation

## 2015-05-19 DIAGNOSIS — R7303 Prediabetes: Secondary | ICD-10-CM | POA: Insufficient documentation

## 2015-05-19 DIAGNOSIS — E785 Hyperlipidemia, unspecified: Secondary | ICD-10-CM | POA: Insufficient documentation

## 2015-05-19 DIAGNOSIS — I10 Essential (primary) hypertension: Secondary | ICD-10-CM | POA: Insufficient documentation

## 2015-05-19 DIAGNOSIS — K219 Gastro-esophageal reflux disease without esophagitis: Secondary | ICD-10-CM | POA: Insufficient documentation

## 2015-06-11 ENCOUNTER — Ambulatory Visit (INDEPENDENT_AMBULATORY_CARE_PROVIDER_SITE_OTHER): Payer: Medicare Other | Admitting: Nurse Practitioner

## 2015-06-11 ENCOUNTER — Encounter: Payer: Self-pay | Admitting: Nurse Practitioner

## 2015-06-11 VITALS — BP 140/90 | HR 66 | Ht 69.0 in | Wt 226.5 lb

## 2015-06-11 DIAGNOSIS — G8110 Spastic hemiplegia affecting unspecified side: Secondary | ICD-10-CM | POA: Diagnosis not present

## 2015-06-11 DIAGNOSIS — G40209 Localization-related (focal) (partial) symptomatic epilepsy and epileptic syndromes with complex partial seizures, not intractable, without status epilepticus: Secondary | ICD-10-CM

## 2015-06-11 DIAGNOSIS — I6930 Unspecified sequelae of cerebral infarction: Secondary | ICD-10-CM | POA: Insufficient documentation

## 2015-06-11 DIAGNOSIS — Z8673 Personal history of transient ischemic attack (TIA), and cerebral infarction without residual deficits: Secondary | ICD-10-CM | POA: Diagnosis not present

## 2015-06-11 MED ORDER — TIZANIDINE HCL 2 MG PO TABS: ORAL_TABLET | ORAL | Status: DC

## 2015-06-11 MED ORDER — LEVETIRACETAM 250 MG PO TABS: ORAL_TABLET | ORAL | Status: DC

## 2015-06-11 MED ORDER — CLOPIDOGREL BISULFATE 75 MG PO TABS: 75.0000 mg | ORAL_TABLET | Freq: Every day | ORAL | Status: DC

## 2015-06-11 NOTE — Progress Notes (Signed)
[facility]  PATIENT: Arthur Cisneros. DOB: 12/08/1955   REASON FOR VISIT: Follow-up for history of right MCA infarct and seizure disorder, transient headaches left hemiplegia  HISTORY FROM: Patient and wife    HISTORY OF PRESENT ILLNESS:UPDATE 06/11/15 Arthur Cisneros, 59 year old male returns for followup. He has a history of right MCA infarct in 2011 and also isolated seizure with secondary generalization in March of 2012 likely as late effect of his stroke. He remains on Keppra without further seizure activity, and he is on Plavix with minimal bruising. He continues to wear AFO brace on the left lower leg. No falls since last seen. Doppler study May 2016 without change from the previous year.  He denies any double vision, increased focal weakness, speech or swallowing difficulty, blackouts or confusion. He continues to walk around the house for exercise.he uses a single-point cane . His right parietal occipital transient headaches are improved at present. He stopped his Topamax 6 months ago He returns for reevaluation. Recent diagnosis of prostate cancer. He has not started any treatments. He has lost 11 pounds since last seen and was congratulated for that.   HISTORY: of right MCA infarct occurred August 2011. He had isolated partial seizure with secondary generalization in March 2012 likely as late effects from stroke. He has vascular risk factors of hypertension, hyperlipidemia, coronary artery disease and carotid occlusion. He says recent labs were good done at primary care. Blood pressure has been in good control but is slightly elevated today at 138/92. He denies double vision, loss of vision, focal weakness, numbness, speech or swallowing problems, confusions, blackouts. He continues to wear a brace on the left lower leg. Recent carotid Doppler studies without change from previous year.   REVIEW OF SYSTEMS: Full 14 system review of systems performed and notable only for  those listed, all others are neg:  Constitutional: neg  Cardiovascular: neg Ear/Nose/Throat: neg  Skin: neg Eyes: neg Respiratory: neg Gastroitestinal: neg  Hematology/Lymphatic: neg  Endocrine: neg Musculoskeletal:neg Allergy/Immunology: neg Neurological: neg Psychiatric: neg Sleep : neg   ALLERGIES: No Known Allergies  HOME MEDICATIONS: Outpatient Prescriptions Prior to Visit  Medication Sig Dispense Refill  . atorvastatin (LIPITOR) 40 MG tablet Take 40 mg by mouth daily.    . clopidogrel (PLAVIX) 75 MG tablet Take 1 tablet (75 mg total) by mouth daily. 30 tablet 6  . diclofenac sodium (VOLTAREN) 1 % GEL Apply 1 application topically 4 (four) times daily as needed.     . levETIRAcetam (KEPPRA) 250 MG tablet TAKE (1) TABLET TWICE A DAY. 60 tablet 6  . losartan (COZAAR) 25 MG tablet Take 25 mg by mouth daily.    . nitroGLYCERIN (NITROSTAT) 0.4 MG SL tablet Place 0.4 mg under the tongue every 5 (five) minutes as needed.    . ranitidine (ZANTAC) 150 MG tablet Take 150 mg by mouth as needed for heartburn.    Marland Kitchen tiZANidine (ZANAFLEX) 2 MG tablet TAKE ONE TABLET AT BED-TIME WHEN NEEDED. 30 tablet 6  . mirtazapine (REMERON) 30 MG tablet TAKE 1 TABLET AT BEDTIME. (Patient not taking: Reported on 06/11/2015) 30 tablet 5   No facility-administered medications prior to visit.    PAST MEDICAL HISTORY: Past Medical History  Diagnosis Date  . Hyperlipidemia   . Hypertension   . Stroke   . Diabetes mellitus     states no longer high blood sugars  . Coronary artery disease   . Anxiety   . Prostate cancer 05-2015  PAST SURGICAL HISTORY: Past Surgical History  Procedure Laterality Date  . Coronary artery bypass graft      4 graft    FAMILY HISTORY: Family History  Problem Relation Age of Onset  . Heart disease Mother   . Diabetes Mother   . Arthritis Mother   . Heart disease Father   . Diabetes Father   . Stroke Other   . Gout Other   . Coronary artery disease Other       SOCIAL HISTORY: History   Social History  . Marital Status: Married    Spouse Name: [name]  . Number of Children: 5  . Years of Education: 8th    Occupational History  .     Social History Main Topics  . Smoking status: Never Smoker   . Smokeless tobacco: Current User    Types: Chew  . Alcohol Use: No  . Drug Use: No  . Sexual Activity: Not on file   Other Topics Concern  . Not on file   Social History Narrative   Patient lives at home with wife [name].    Patient has 5 children.    Patient has a 8th grade.    Patient is retired.    Patient is on disability.    Patient is right handed.    Patient drinks caffeine every once in a while.      PHYSICAL EXAM  Filed Vitals:   06/11/15 1308  BP: 151/91  Pulse: 66  Height: 5\' 9"  (1.753 m)  Weight: 226 lb 8 oz (102.74 kg)   Body mass index is 33.43 kg/(m^2). Generalized: Well developed middle aged 40 obese male, in no acute distress  Head: normocephalic and atraumatic,. Oropharynx benign  Neck: Supple, no carotid bruits . Cardiac: Regular rate rhythm, no murmur  Lungs: Clear to auscultation  Musculoskeletal: AFO to left leg,  Neurological examination   Mentation: Alert oriented to time, place, history taking. Follows all commands speech and language fluent  Cranial nerve II-XII: Fundoscopic exam reveals sharp disc margins.Pupils were equal round reactive to light extraocular movements were full, visual field with dense left hemianopsia . Mild left lower facial weakness Hearing was intact to finger rubbing bilaterally. Uvula tongue midline. head turning and shoulder shrug were normal and symmetric.Tongue protrusion into cheek strength was normal. Motor: Spastic left hemiplegia with 1/5 upper extremity and 0/5 distal weakness. Left lower extremity is 4/5 and 1/5 ankle dorsiflexion and footdrop. Sensory: Intact to primary modalities on the right, decreased on the left Coordination: finger-nose-finger,  heel-to-shin bilaterally, normal on the right unable to perform on the left  Reflexes: Brisker on the left, 1+ on the right Gait and Station: Gait hemiparetic uses a cane, drags his left foot DIAGNOSTIC DATA (LABS, IMAGING, TESTING)  ASSESSMENT AND PLAN  59 y.o. year old male  has a past medical history of Hyperlipidemia; Hypertension; Stroke;  Diabetes mellitus; Coronary artery disease; Anxiety; and Prostate cancer (05-2015). And seizure disorder in excellent control on Keppra. No further stroke or TIA symptoms. Carotid Doppler in May 2016 without change from 2015.The patient is a current patient of Dr. Leonie Man who is out of the office today . This note is sent to the work in doctor.     Continue Plavix at current dose will renew Continue Keppra at current dose will renew Continue Tizanidine at the current dose, will renew Wear leg  brace at all times outside, using cane for gait stability Given copy of carotid Doppler results Follow-up in  6 months Dennie Bible, [facility], [facility], APRN  [facility] [address] [place], [place] [address] [phone]

## 2015-06-11 NOTE — Patient Instructions (Signed)
Continue Plavix at current dose will renew Continue Keppra at current dose will renew Continue Tizanidine at the current dose Wear leg  brace at all times outside, using cane for gait stability Given copy of carotid Doppler results Follow-up in 6 months

## 2015-06-12 ENCOUNTER — Ambulatory Visit: Payer: 59 | Admitting: Nurse Practitioner

## 2015-06-12 NOTE — Progress Notes (Signed)
I have reviewed and agreed above plan. 

## 2015-08-17 ENCOUNTER — Encounter: Payer: Self-pay | Admitting: Radiation Oncology

## 2015-08-17 ENCOUNTER — Ambulatory Visit
Admission: RE | Admit: 2015-08-17 | Discharge: 2015-08-17 | Disposition: A | Discharge status: Home / Self Care | Payer: Medicare Other | Source: Ambulatory Visit | Attending: Radiation Oncology | Admitting: Radiation Oncology

## 2015-08-17 VITALS — BP 147/87 | HR 61 | Resp 16 | Ht 69.0 in | Wt 230.2 lb

## 2015-08-17 DIAGNOSIS — C61 Malignant neoplasm of prostate: Secondary | ICD-10-CM

## 2015-08-17 NOTE — Progress Notes (Signed)
Radiation Oncology         [phone]) [phone] ________________________________  Initial outpatient Consultation  Name: Arthur Cisneros. MRN: [id number]  Date: 08/17/2015  DOB: 1956-10-24  IR:SWNIOEVO,JJKKXFG M, MD  Petra Kuba, MD   REFERRING PHYSICIAN: Petra Kuba, MD  DIAGNOSIS: 59 y.o. gentleman with stage T1c adenocarcinoma of the prostate with a Gleason's score of 4+4 and a PSA of 25.7    ICD-9-CM ICD-10-CM  1. Malignant neoplasm of prostate ([place]) Arthur Cisneros. is a 59 y.o. gentleman.  He was noted to have an elevated PSA of 25.7 by his primary care physician, Dr. Shade Flood.  Accordingly, he was referred for evaluation in urology by Dr. Valma Cava on 05/25/15,  digital rectal examination was performed at that time revealing no nodules.  The patient proceeded to transrectal ultrasound with 12 biopsies of the prostate on 05/07/15.  The prostate volume measured 80 cc.  Out of 12 core biopsies, 10 were positive.  The maximum Gleason score was 4+4.  Bone scan as well as CT pelvis showed no metastatic disease.   The patient has a significant cardiac history, potentially preventing surgical intervention. The patient was seen for second opinion by Dr. Alinda Money on 08/07/15.  The patient reviewed the biopsy results with his urologist and he has kindly been referred today for discussion of potential radiation treatment options.   PREVIOUS RADIATION THERAPY: No  PAST MEDICAL HISTORY:  has a past medical history of Hyperlipidemia; Hypertension; Stroke [facility]); Diabetes mellitus; Coronary artery disease; Anxiety; and Prostate cancer ([place]) (05-2015).    PAST SURGICAL HISTORY: Past Surgical History  Procedure Laterality Date  . Coronary artery bypass graft      4 graft  . Prostate biopsy      FAMILY HISTORY: family history includes Arthritis in his mother; Coronary artery disease in his other; Diabetes in his father and mother; Gout in his other;  Heart disease in his father and mother; Stroke in his other.  SOCIAL HISTORY:  reports that he has never smoked. His smokeless tobacco use includes Chew. He reports that he does not drink alcohol or use illicit drugs.  ALLERGIES: Review of patient's allergies indicates no known allergies.  MEDICATIONS:  Current Outpatient Prescriptions  Medication Sig Dispense Refill  . acetaminophen (TYLENOL) 500 MG tablet Take 1,000 mg by mouth every 6 (six) hours as needed.    Marland Kitchen atorvastatin (LIPITOR) 40 MG tablet Take 40 mg by mouth daily.    . clopidogrel (PLAVIX) 75 MG tablet Take 1 tablet (75 mg total) by mouth daily. 30 tablet 6  . diclofenac sodium (VOLTAREN) 1 % GEL Apply 1 application topically 4 (four) times daily as needed.     . levETIRAcetam (KEPPRA) 250 MG tablet TAKE (1) TABLET TWICE A DAY. 60 tablet 11  . losartan (COZAAR) 25 MG tablet Take 25 mg by mouth daily.    . nitroGLYCERIN (NITROSTAT) 0.4 MG SL tablet Place 0.4 mg under the tongue every 5 (five) minutes as needed.    . ranitidine (ZANTAC) 150 MG tablet Take 150 mg by mouth as needed for heartburn.    Marland Kitchen tiZANidine (ZANAFLEX) 2 MG tablet TAKE ONE TABLET AT BED-TIME WHEN NEEDED. 30 tablet 6   No current facility-administered medications for this encounter.    REVIEW OF SYSTEMS:  A 15 point review of systems is documented in the electronic medical record. This was obtained by the nursing staff. However, I reviewed this with  the patient to discuss relevant findings and make appropriate changes.  Pertinent items are noted in HPI. Pertinent items noted in HPI and remainder of comprehensive ROS otherwise negative..  The patient completed an IPSS and IIEF questionnaire.  His IPSS score was 10 indicating moderate urinary outflow obstructive symptoms.  He indicated that his erectile function is able to complete sexual activity on most attempts.  Reports nocturia x 3. Reports urinary incontinence, urgency, and frequency. Reports urinary  hesitancy and difficulty emptying. Reports occasional dysuria. Denies hematuria. Reports night sweats. Denies weight loss. Explains that in 2012 he had one seizure and has been on Keppra since. Denies having a pacemaker. Denies hx of radiation therapy. Uses a walker to ambulate. Lives 19 miles from [place] in [place]. Then, 25 from [place] but, 65 miles from [place]. Requesting to received external beam treatment closer to home. Reports chronic right low back pain. Requesting to receive hormone shots closer to home.    PHYSICAL EXAM: This patient is in no acute distress.  He is alert and oriented.   height is 5\' 9"  (1.753 m) and weight is 230 lb 3.2 oz (104.418 kg). His blood pressure is 147/87 and his pulse is 61. His respiration is 16 and oxygen saturation is 100%.  He exhibits no respiratory distress or labored breathing.  He appears neurologically intact.  His mood is pleasant.  His affect is appropriate.  Please note the digital rectal exam findings described above.   KPS = 100  100 - Normal; no complaints; no evidence of disease. 90   - Able to carry on normal activity; minor signs or symptoms of disease. 80   - Normal activity with effort; some signs or symptoms of disease. 52   - Cares for self; unable to carry on normal activity or to do active work. 60   - Requires occasional assistance, but is able to care for most of his personal needs. 50   - Requires considerable assistance and frequent medical care. 30   - Disabled; requires special care and assistance. 72   - Severely disabled; hospital admission is indicated although death not imminent. 53   - Very sick; hospital admission necessary; active supportive treatment necessary. 10   - Moribund; fatal processes progressing rapidly. 0     - Dead  Karnofsky DA, Abelmann Leonard, Craver LS and Burchenal [facility] [phone]) The use of the nitrogen mustards in the palliative treatment of carcinoma: with particular reference to bronchogenic carcinoma  Cancer 1 [phone]   LABORATORY DATA:  Lab Results  Component Value Date   WBC 7.3 01/18/2011   HGB 14.4 01/18/2011   HCT 42.1 01/18/2011   MCV 87.5 01/18/2011   PLT 208 01/18/2011   Lab Results  Component Value Date   NA 140 01/19/2011   K 4.1 01/19/2011   CL 107 01/19/2011   CO2 24 01/19/2011   Lab Results  Component Value Date   ALT 22 01/19/2011   AST 18 01/19/2011   ALKPHOS 23* 01/19/2011   BILITOT 0.6 01/19/2011     RADIOGRAPHY: No results found.    IMPRESSION: This gentleman is a 59 yo gentleman with stage T1c adenocarcinoma of the prostate with a Gleason's score of 4+4 and a PSA of 25.7.  His T-Stage, Gleason's Score, and PSA put him into the high risk group.  Accordingly he is eligible for a variety of potential treatment options including external beam radiation and hormone therapy.   PLAN: Today, I talked to the patient and  family about the findings and work-up thus far.  We discussed the natural history of prostatic adenocarcinoma and general treatment, highlighting the role of radiotherapy in the management.  We discussed the available radiation techniques, and focused on the details of logistics and delivery.  We reviewed the anticipated acute and late sequelae associated with radiation in this setting.  The patient was encouraged to ask questions that I answered to the best of my ability.  I filled out a patient counseling form during our discussion including treatment diagrams.  We retained a copy for our records.  The patient would like to proceed with radiation and will be scheduled for CT simulation at our [facility] clinic.   I have referred the patient to Dr. Clyde Lundborg of [facility] for consultation.  If Dr. Exie Parody agrees with this plan, the patient can potentially receive his hormone shots and gold fiducial markers closer to home.  I spent 60 minutes minutes face to face with the patient and more than 50% of that time was spent in counseling and/or coordination of care.     This document serves as a record of services personally performed by Tyler Pita, MD. It was created on his behalf by Arlyce Harman, a trained medical scribe. The creation of this record is based on the scribe's personal observations and the provider's statements to them. This document has been checked and approved by the attending provider.    ------------------------------------------------  Sheral Apley Tammi Klippel, M.D.

## 2015-08-17 NOTE — Progress Notes (Addendum)
Reports nocturia x 3. Reports urinary incontinence, urgency, and frequency. Reports urinary hesitancy and difficulty emptying. Reports occasional dysuria. Denies hematuria. Reports night sweats. Denies weight loss. Explains that in 2012 he had one seizure and has been on Keppra since. Langley Gauss having a pacemaker. Denies hx of radiation therapy. Lives [place] from [place] in [place]. Then, 25 from [place] but, 65 miles from [place]. Requesting to received external beam treatment closer to home. Reports chronic right low back pain.   BP 147/87 mmHg  Pulse 61  Resp 16  Ht 5\' 9"  (1.753 m)  Wt 230 lb 3.2 oz (104.418 kg)  BMI 33.98 kg/m2  SpO2 100% Wt Readings from Last 3 Encounters:  08/17/15 230 lb 3.2 oz (104.418 kg)  06/11/15 226 lb 8 oz (102.74 kg)  12/02/14 236 lb (107.049 kg)

## 2015-08-17 NOTE — Progress Notes (Signed)
GU Location of Tumor / Histology: prostatic adenocarcinoma  If Prostate Cancer, Gleason Score is (4 + 4) and PSA is (25.7)  Arthur Cisneros. presented to Dr. Alinda Money on 08/07/2015 for a second opinion.  Biopsies of prostate (if applicable) revealed:    Past/Anticipated interventions by urology, if any: Dr. Valma Cava, original urologist to evaluate patient,  recommends external beam radiation in combination with long term androgen deprivation therapy.   Past/Anticipated interventions by medical oncology, if any: no  Weight changes, if any: no  Bowel/Bladder complaints, if any: nocturia, weak stream, erectile dysfunction    Nausea/Vomiting, if any: no  Pain issues, if any:  no  SAFETY ISSUES:  Prior radiation? no  Pacemaker/ICD? no  Possible current pregnancy? no  Is the patient on methotrexate? no  Current Complaints / other details:  59 year old male. PROSTATE VOLUME: 80 CC. Uses walker to ambulate. Lives closest to [place] facility.

## 2015-08-17 NOTE — Progress Notes (Signed)
See progress note under physician encounter. 

## 2015-08-24 ENCOUNTER — Telehealth: Payer: Self-pay | Admitting: *Deleted

## 2015-08-24 NOTE — Telephone Encounter (Signed)
CALLED PATIENT TO INFORM OF CONSULTATION WITH DR. Exie Parody ON 09-15-15 - ARRIVAL TIME - 2:15 PM AND HIS APPT. FOR GOLD SEED PLACEMENT ON 09-17-15 - ARRIVAL TIME - 9:15 AM, PT. TO START RT IN [place] 2 MONTHS AFTER LUPRON INJ., SPOKE WITH PATIENT AND HE IS AWARE OF THESE APPTS.

## 2015-09-24 ENCOUNTER — Encounter: Payer: Self-pay | Admitting: *Deleted

## 2015-09-24 ENCOUNTER — Ambulatory Visit (INDEPENDENT_AMBULATORY_CARE_PROVIDER_SITE_OTHER): Payer: Medicare Other | Admitting: Cardiovascular Disease

## 2015-09-24 ENCOUNTER — Encounter: Payer: Self-pay | Admitting: Cardiovascular Disease

## 2015-09-24 VITALS — BP 112/77 | HR 66 | Ht 69.0 in | Wt 229.0 lb

## 2015-09-24 DIAGNOSIS — C61 Malignant neoplasm of prostate: Secondary | ICD-10-CM | POA: Diagnosis not present

## 2015-09-24 DIAGNOSIS — Z951 Presence of aortocoronary bypass graft: Secondary | ICD-10-CM | POA: Diagnosis not present

## 2015-09-24 DIAGNOSIS — I739 Peripheral vascular disease, unspecified: Secondary | ICD-10-CM

## 2015-09-24 DIAGNOSIS — I10 Essential (primary) hypertension: Secondary | ICD-10-CM

## 2015-09-24 DIAGNOSIS — I779 Disorder of arteries and arterioles, unspecified: Secondary | ICD-10-CM

## 2015-09-24 DIAGNOSIS — I25812 Atherosclerosis of bypass graft of coronary artery of transplanted heart without angina pectoris: Secondary | ICD-10-CM

## 2015-09-24 DIAGNOSIS — I639 Cerebral infarction, unspecified: Secondary | ICD-10-CM

## 2015-09-24 DIAGNOSIS — E785 Hyperlipidemia, unspecified: Secondary | ICD-10-CM

## 2015-09-24 NOTE — Patient Instructions (Signed)
Your physician has requested that you have a lexiscan myoview. For further information please visit [email or web address]. Please follow instruction sheet, as given. Your physician has requested that you have an echocardiogram. Echocardiography is a painless test that uses sound waves to create images of your heart. It provides your doctor with information about the size and shape of your heart and how well your heart's chambers and valves are working. This procedure takes approximately one hour. There are no restrictions for this procedure. Office will contact with results via phone or letter.   Continue all current medications. Your physician wants you to follow up in: 6 months.  You will receive a reminder letter in the mail one-two months in advance.  If you don't receive a letter, please call our office to schedule the follow up appointment

## 2015-09-24 NOTE — Progress Notes (Signed)
Patient ID: Arthur Nonnemacher., male   DOB: 1955/11/19, 59 y.o.   MRN: [id number]       CARDIOLOGY CONSULT NOTE  Patient ID: Arthur Manges. MRN: [id number] DOB/AGE: 02/21/1956 59 y.o.  Admit date: (Not on file) Primary Physician Petra Kuba, MD  Reason for Consultation: CAD/CABG  HPI: The patient is a 59 year old male with a history of coronary artery disease and four-vessel CABG, hypertension, hyperlipidemia, and stroke. He was recently diagnosed with prostate cancer by Dr. Exie Parody.  Echocardiogram performed on 07/09/10 demonstrated normal left ventricular systolic function, EF 123456, and mild LVH.  He underwent CABG in 2000 at [facility]. He had been following with a cardiologist in [place] up until 7 years ago. 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 He was diagnosed with prostate cancer on 06/09/15 and is being treated with radiation therapy. He denies chest pain and shortness of breath. He said he has a hiatal hernia. He also has carotid artery disease. Carotid Dopplers on 03/19/15 showed total occlusion of the right internal carotid artery and 50-69% left internal carotid artery stenosis. Other complaints include low back pain which is chronic.   No Known Allergies  Current Outpatient Prescriptions  Medication Sig Dispense Refill  . acetaminophen (TYLENOL) 500 MG tablet Take 1,000 mg by mouth every 6 (six) hours as needed.    Marland Kitchen atorvastatin (LIPITOR) 40 MG tablet Take 40 mg by mouth daily.    . bicalutamide (CASODEX) 50 MG tablet Take 1 tablet by mouth daily.    . clopidogrel (PLAVIX) 75 MG tablet Take 1 tablet (75 mg total) by mouth daily. 30 tablet 6  . diclofenac sodium (VOLTAREN) 1 % GEL Apply 1 application topically 4 (four) times daily as needed.     . hydrochlorothiazide (MICROZIDE) 12.5 MG capsule Take 1 capsule by mouth daily.    Marland Kitchen levETIRAcetam (KEPPRA) 250 MG tablet TAKE (1) TABLET TWICE A DAY. 60 tablet 11  . losartan (COZAAR) 25 MG tablet Take 25 mg  by mouth daily.    . nitroGLYCERIN (NITROSTAT) 0.4 MG SL tablet Place 0.4 mg under the tongue every 5 (five) minutes as needed.    . ranitidine (ZANTAC) 150 MG tablet Take 150 mg by mouth as needed for heartburn.    Marland Kitchen tiZANidine (ZANAFLEX) 2 MG tablet TAKE ONE TABLET AT BED-TIME WHEN NEEDED. 30 tablet 6   No current facility-administered medications for this visit.    Past Medical History  Diagnosis Date  . Hyperlipidemia   . Hypertension   . Stroke ([place])   . Diabetes mellitus     states no longer high blood sugars  . Coronary artery disease   . Anxiety   . Prostate cancer ([place]) 05-2015    Past Surgical History  Procedure Laterality Date  . Coronary artery bypass graft      4 graft  . Prostate biopsy      Social History   Social History  . Marital Status: Married    Spouse Name: Arthur Cisneros  . Number of Children: 5  . Years of Education: 8th    Occupational History  .     Social History Main Topics  . Smoking status: Former Smoker -- 0.25 packs/day for 15 years    Types: Cigarettes    Start date: 10/19/1970    Quit date: 10/19/1985  . Smokeless tobacco: Current User    Types: Chew  . Alcohol Use: No  . Drug Use: No  . Sexual Activity: Not  Currently   Other Topics Concern  . Not on file   Social History Narrative   Patient lives at home with wife Arthur Cisneros.    Patient has 5 children.    Patient has a 8th grade.    Patient is retired.    Patient is on disability.    Patient is right handed.    Patient drinks caffeine every once in a while.      No family history of premature CAD in 1st degree relatives.  Prior to Admission medications   Medication Sig Start Date End Date Taking? Authorizing Provider  acetaminophen (TYLENOL) 500 MG tablet Take 1,000 mg by mouth every 6 (six) hours as needed.   Yes Historical Provider, MD  atorvastatin (LIPITOR) 40 MG tablet Take 40 mg by mouth daily.   Yes Historical Provider, MD  bicalutamide (CASODEX) 50 MG tablet Take 1  tablet by mouth daily. 09/17/15  Yes Historical Provider, MD  clopidogrel (PLAVIX) 75 MG tablet Take 1 tablet (75 mg total) by mouth daily. 06/11/15  Yes Dennie Bible, NP  diclofenac sodium (VOLTAREN) 1 % GEL Apply 1 application topically 4 (four) times daily as needed.    Yes Historical Provider, MD  hydrochlorothiazide (MICROZIDE) 12.5 MG capsule Take 1 capsule by mouth daily. 09/17/15  Yes Historical Provider, MD  levETIRAcetam (KEPPRA) 250 MG tablet TAKE (1) TABLET TWICE A DAY. 06/11/15  Yes Dennie Bible, NP  losartan (COZAAR) 25 MG tablet Take 25 mg by mouth daily.   Yes Historical Provider, MD  nitroGLYCERIN (NITROSTAT) 0.4 MG SL tablet Place 0.4 mg under the tongue every 5 (five) minutes as needed.   Yes Historical Provider, MD  ranitidine (ZANTAC) 150 MG tablet Take 150 mg by mouth as needed for heartburn.   Yes Historical Provider, MD  tiZANidine (ZANAFLEX) 2 MG tablet TAKE ONE TABLET AT BED-TIME WHEN NEEDED. 06/11/15  Yes Dennie Bible, NP     Review of systems complete and found to be negative unless listed above in HPI     Physical exam Blood pressure 112/77, pulse 66, height 5\' 9"  (1.753 m), weight 229 lb (103.874 kg). General: NAD Neck: No JVD, no thyromegaly or thyroid nodule.  Lungs: Clear to auscultation bilaterally with normal respiratory effort. CV: Nondisplaced PMI. Regular rate and rhythm, normal S1/S2, no S3/S4, no murmur.  No peripheral edema.  No carotid bruit.   Abdomen: Soft, nontender, obese.  Skin: Intact without lesions or rashes.  Neurologic: Alert and oriented x 3.  Psych: Normal affect. Extremities: No clubbing or cyanosis.  HEENT: Normal.   ECG: Most recent ECG reviewed.  Labs:   Lab Results  Component Value Date   WBC 7.3 01/18/2011   HGB 14.4 01/18/2011   HCT 42.1 01/18/2011   MCV 87.5 01/18/2011   PLT 208 01/18/2011   No results for input(s): NA, K, CL, CO2, BUN, CREATININE, CALCIUM, PROT, BILITOT, ALKPHOS, ALT, AST,  GLUCOSE in the last 168 hours.  Invalid input(s): LABALBU Lab Results  Component Value Date   CKTOTAL 26 01/18/2011   CKMB 0.8 01/18/2011   TROPONINI 0.01        NO INDICATION OF MYOCARDIAL INJURY. 01/18/2011    Lab Results  Component Value Date   CHOL  07/09/2010    193        ATP III CLASSIFICATION:  <200     mg/dL   Desirable  200-239  mg/dL   Borderline High  >=240    mg/dL   High  Lab Results  Component Value Date   HDL 31* 07/09/2010   Lab Results  Component Value Date   LDLCALC * 07/09/2010    127        Total Cholesterol/HDL:CHD Risk Coronary Heart Disease Risk Table                     Men   Women  1/2 Average Risk   3.4   3.3  Average Risk       5.0   4.4  2 X Average Risk   9.6   7.1  3 X Average Risk  23.4   11.0        Use the calculated Patient Ratio above and the CHD Risk Table to determine the patient's CHD Risk.        ATP III CLASSIFICATION (LDL):  <100     mg/dL   Optimal  100-129  mg/dL   Near or Above                    Optimal  130-159  mg/dL   Borderline  160-189  mg/dL   High  >190     mg/dL   Very High   Lab Results  Component Value Date   TRIG 174* 07/09/2010   Lab Results  Component Value Date   CHOLHDL 6.2 07/09/2010   No results found for: LDLDIRECT       Studies: No results found.  ASSESSMENT AND PLAN:  1. CAD s/p CABG: Symptomatically stable. He has not undergone any CV testing in several years. Will obtain a Lexiscan Cardiolite for ischemic surveillance and an echocardiogram to assess cardiac structure and function.  2. Essential HTN: Controlled on HCTZ 12.5 mg and losartan 25 mg. No changes.  3. Hyperlipidemia: Obtain copy of lipids. Continue Lipitor 40 mg.  4. CVA: Continue Plavix.  5. Bilateral carotid artery stenosis: Dopplers from 03/19/15 reviewed above. Continue Plavix and Lipitor. Follows with vascular surgery.  Dispo: f/u 6 months.  Signed: Kate Sable, M.D., F.A.C.C.  09/24/2015,  9:17 AM

## 2015-09-30 ENCOUNTER — Ambulatory Visit (HOSPITAL_COMMUNITY)
Admission: RE | Admit: 2015-09-30 | Discharge: 2015-09-30 | Disposition: A | Discharge status: Home / Self Care | Payer: Medicare Other | Source: Ambulatory Visit | Attending: Cardiovascular Disease | Admitting: Cardiovascular Disease

## 2015-09-30 DIAGNOSIS — C61 Malignant neoplasm of prostate: Secondary | ICD-10-CM | POA: Diagnosis not present

## 2015-09-30 DIAGNOSIS — I10 Essential (primary) hypertension: Secondary | ICD-10-CM | POA: Diagnosis not present

## 2015-09-30 DIAGNOSIS — Z951 Presence of aortocoronary bypass graft: Secondary | ICD-10-CM | POA: Diagnosis not present

## 2015-10-01 ENCOUNTER — Telehealth: Payer: Self-pay | Admitting: *Deleted

## 2015-10-01 NOTE — Telephone Encounter (Signed)
Notes Recorded by Laurine Blazer, LPN on [id number] at 9:42 AM Patient notified. Copy fwd to pmd.

## 2015-10-01 NOTE — Telephone Encounter (Signed)
-----   Message from Laurine Blazer, LPN sent at [phone]  4:33 PM EST -----   ----- Message -----    From: Herminio Commons, MD    Sent: 09/30/2015   2:48 PM      To: Staci T Ashworth, CMA  Normal pumping function. Mild aortic root dilatation.

## 2015-10-02 ENCOUNTER — Encounter (HOSPITAL_COMMUNITY): Payer: Self-pay

## 2015-10-02 ENCOUNTER — Encounter (HOSPITAL_COMMUNITY)
Admission: RE | Admit: 2015-10-02 | Discharge: 2015-10-02 | Disposition: A | Discharge status: Home / Self Care | Payer: Medicare Other | Source: Ambulatory Visit | Attending: Cardiovascular Disease | Admitting: Cardiovascular Disease

## 2015-10-02 ENCOUNTER — Inpatient Hospital Stay (HOSPITAL_COMMUNITY): Admission: RE | Admit: 2015-10-02 | Payer: Medicare Other | Source: Ambulatory Visit

## 2015-10-02 ENCOUNTER — Other Ambulatory Visit (HOSPITAL_COMMUNITY): Payer: Medicare Other

## 2015-10-02 DIAGNOSIS — Z951 Presence of aortocoronary bypass graft: Secondary | ICD-10-CM

## 2015-10-02 DIAGNOSIS — Q210 Ventricular septal defect: Secondary | ICD-10-CM | POA: Insufficient documentation

## 2015-10-02 DIAGNOSIS — C61 Malignant neoplasm of prostate: Secondary | ICD-10-CM

## 2015-10-02 LAB — NM MYOCAR MULTI W/SPECT W/WALL MOTION / EF
CHL CUP NUCLEAR SRS: 0
CHL CUP NUCLEAR SSS: 2
LHR: 0.12
LV dias vol: 93 mL
LVSYSVOL: 32 mL
NUC STRESS TID: 1.1
Peak HR: 98 {beats}/min
Rest HR: 67 {beats}/min
SDS: 2

## 2015-10-02 MED ORDER — REGADENOSON 0.4 MG/5ML IV SOLN
INTRAVENOUS | Status: AC
  Administered 2015-10-02: 0.4 mg via INTRAVENOUS
  Filled 2015-10-02: qty 5

## 2015-10-02 MED ORDER — SODIUM CHLORIDE 0.9 % IJ SOLN
INTRAMUSCULAR | Status: AC
  Administered 2015-10-02: 10 mL via INTRAVENOUS
  Filled 2015-10-02: qty 3

## 2015-10-02 MED ORDER — TECHNETIUM TC 99M SESTAMIBI - CARDIOLITE
10.0000 | Freq: Once | INTRAVENOUS | Status: AC | PRN
  Administered 2015-10-02: 9 via INTRAVENOUS

## 2015-10-02 MED ORDER — TECHNETIUM TC 99M SESTAMIBI GENERIC - CARDIOLITE
30.0000 | Freq: Once | INTRAVENOUS | Status: AC | PRN
  Administered 2015-10-02: 32 via INTRAVENOUS

## 2015-12-17 ENCOUNTER — Encounter: Payer: Self-pay | Admitting: Nurse Practitioner

## 2015-12-17 ENCOUNTER — Ambulatory Visit (INDEPENDENT_AMBULATORY_CARE_PROVIDER_SITE_OTHER): Payer: Medicare Other | Admitting: Nurse Practitioner

## 2015-12-17 VITALS — BP 123/83 | HR 84 | Ht 69.0 in | Wt 226.8 lb

## 2015-12-17 DIAGNOSIS — G8110 Spastic hemiplegia affecting unspecified side: Secondary | ICD-10-CM | POA: Diagnosis not present

## 2015-12-17 DIAGNOSIS — G40209 Localization-related (focal) (partial) symptomatic epilepsy and epileptic syndromes with complex partial seizures, not intractable, without status epilepticus: Secondary | ICD-10-CM

## 2015-12-17 DIAGNOSIS — I10 Essential (primary) hypertension: Secondary | ICD-10-CM | POA: Diagnosis not present

## 2015-12-17 DIAGNOSIS — G8114 Spastic hemiplegia affecting left nondominant side: Secondary | ICD-10-CM

## 2015-12-17 MED ORDER — TIZANIDINE HCL 2 MG PO TABS: ORAL_TABLET | ORAL | Status: DC

## 2015-12-17 MED ORDER — LEVETIRACETAM 250 MG PO TABS: ORAL_TABLET | ORAL | Status: DC

## 2015-12-17 MED ORDER — CLOPIDOGREL BISULFATE 75 MG PO TABS: 75.0000 mg | ORAL_TABLET | Freq: Every day | ORAL | Status: DC

## 2015-12-17 NOTE — Patient Instructions (Signed)
Continue Plavix at current dose will renew Continue Keppra at current dose will renew Continue Tizanidine at the current dose, will renew Wear leg brace at all times outside, using cane for gait stability Follow-up yearly next visit with Dr. Leonie Man

## 2015-12-17 NOTE — Progress Notes (Signed)
[facility]  PATIENT: Arthur Cisneros. DOB: 05-13-1956   REASON FOR VISIT: Follow-up for brain stem stroke, spastic hemiparesis affecting nondominant side, epilepsy with complex partial seizures HISTORY FROM: Patient and wife    HISTORY OF PRESENT ILLNESS:UPDATE 12/17/2015  Arthur Cisneros, 60year-old male returns for followup. He has a history of right MCA infarct in 2011 and also isolated seizure with secondary generalization in March of 2012 likely as late effect of his stroke. He remains on Keppra without further seizure activity, and he is on Plavix with minimal bruising without further stroke or TIA symptoms. He continues to wear AFO brace on the left lower leg. No falls since last seen. Doppler study May 2016 without change from the previous year. He denies any double vision, increased focal weakness, speech or swallowing difficulty, blackouts or confusion. He continues to walk around the house for exercise.he uses a single-point cane . His right parietal occipital transient headaches are improved and he no longer takes  Topamax. He returns for reevaluation. Recent diagnosis of prostate cancer and he just started treatments this past Monday for 8 weeks.    HISTORY: of right MCA infarct occurred August 2011. He had isolated partial seizure with secondary generalization in March 2012 likely as late effects from stroke. He has vascular risk factors of hypertension, hyperlipidemia, coronary artery disease and carotid occlusion. He says recent labs were good done at primary care. Blood pressure has been in good control but is slightly elevated today at 138/92. He denies double vision, loss of vision, focal weakness, numbness, speech or swallowing problems, confusions, blackouts. He continues to wear a brace on the left lower leg. Recent carotid Doppler studies without change from previous year.   REVIEW OF SYSTEMS: Full 14 system review of systems performed and notable only for  those listed, all others are neg:  Constitutional: neg  Cardiovascular: neg Ear/Nose/Throat: neg  Skin: neg Eyes: neg Respiratory: neg Gastroitestinal: neg  Hematology/Lymphatic: neg  Endocrine: neg Musculoskeletal:neg Allergy/Immunology: neg Neurological: Occasional headache Psychiatric: Depression Sleep : neg   ALLERGIES: Allergies  Allergen Reactions  . Lyrica [Pregabalin]     hallucination    HOME MEDICATIONS: Outpatient Prescriptions Prior to Visit  Medication Sig Dispense Refill  . acetaminophen (TYLENOL) 500 MG tablet Take 1,000 mg by mouth every 6 (six) hours as needed.    Marland Kitchen atorvastatin (LIPITOR) 40 MG tablet Take 40 mg by mouth daily.    . clopidogrel (PLAVIX) 75 MG tablet Take 1 tablet (75 mg total) by mouth daily. 30 tablet 6  . diclofenac sodium (VOLTAREN) 1 % GEL Apply 1 application topically 4 (four) times daily as needed.     . hydrochlorothiazide (MICROZIDE) 12.5 MG capsule Take 1 capsule by mouth daily.    Marland Kitchen levETIRAcetam (KEPPRA) 250 MG tablet TAKE (1) TABLET TWICE A DAY. 60 tablet 11  . losartan (COZAAR) 25 MG tablet Take 25 mg by mouth daily.    . nitroGLYCERIN (NITROSTAT) 0.4 MG SL tablet Place 0.4 mg under the tongue every 5 (five) minutes as needed.    . ranitidine (ZANTAC) 150 MG tablet Take 150 mg by mouth as needed for heartburn.    Marland Kitchen tiZANidine (ZANAFLEX) 2 MG tablet TAKE ONE TABLET AT BED-TIME WHEN NEEDED. 30 tablet 6  . bicalutamide (CASODEX) 50 MG tablet Take 1 tablet by mouth daily. Reported on 12/17/2015     No facility-administered medications prior to visit.    PAST MEDICAL HISTORY: Past Medical History  Diagnosis Date  .  Hyperlipidemia   . Hypertension   . Stroke ([place])   . Diabetes mellitus     states no longer high blood sugars  . Coronary artery disease   . Anxiety   . Prostate cancer ([place]) 05-2015    radiation CC in [place]    PAST SURGICAL HISTORY: Past Surgical History  Procedure Laterality Date  . Coronary artery bypass  graft      4 graft  . Prostate biopsy      FAMILY HISTORY: Family History  Problem Relation Age of Onset  . Heart disease Mother   . Diabetes Mother   . Arthritis Mother   . Heart disease Father   . Diabetes Father   . Stroke Other   . Gout Other   . Coronary artery disease Other     SOCIAL HISTORY: Social History   Social History  . Marital Status: Married    Spouse Name: Arthur Cisneros  . Number of Children: 5  . Years of Education: 8th    Occupational History  .     Social History Main Topics  . Smoking status: Former Smoker -- 0.25 packs/day for 15 years    Types: Cigarettes    Start date: 10/19/1970    Quit date: 10/19/1985  . Smokeless tobacco: Current User    Types: Chew  . Alcohol Use: No  . Drug Use: No  . Sexual Activity: Not Currently   Other Topics Concern  . Not on file   Social History Narrative   Patient lives at home with wife Arthur Cisneros.    Patient has 5 children.    Patient has a 8th grade.    Patient is retired.    Patient is on disability.    Patient is right handed.    Patient drinks caffeine every once in a while.      PHYSICAL EXAM  Filed Vitals:   12/17/15 1040  BP: 123/83  Pulse: 84  Height: 5\' 9"  (1.753 m)  Weight: 226 lb 12.8 oz (102.876 kg)   Body mass index is 33.48 kg/(m^2). Generalized: Well developed middle aged 69 obese male, in no acute distress  Head: normocephalic and atraumatic,. Oropharynx benign  Neck: Supple, no carotid bruits . Cardiac: Regular rate rhythm, no murmur  Lungs: Clear to auscultation  Musculoskeletal: AFO to left leg,  Neurological examination   Mentation: Alert oriented to time, place, history taking. Follows all commands speech and language fluent  Cranial nerve II-XII: Fundoscopic exam reveals sharp disc margins.Pupils were equal round reactive to light extraocular movements were full, visual field with dense left hemianopsia . Mild left lower facial weakness Hearing was intact to finger  rubbing bilaterally. Uvula tongue midline. head turning and shoulder shrug were normal and symmetric.Tongue protrusion into cheek strength was normal. Motor: Spastic left hemiplegia with 1/5 upper extremity and 0/5 distal weakness. Left lower extremity is 4/5 and 1/5 ankle dorsiflexion and footdrop. Sensory: Intact to primary modalities on the right, decreased on the left Coordination: finger-nose-finger, heel-to-shin bilaterally, normal on the right unable to perform on the left  Reflexes: Brisker on the left, 1+ on the right Gait and Station: Gait hemiparetic uses a cane, drags his left foot   DIAGNOSTIC DATA (LABS, IMAGING, TESTING) -   ASSESSMENT AND PLAN 60 y.o. year old male has a past medical history of Hyperlipidemia; Hypertension; Stroke; Diabetes mellitus; Coronary artery disease; Anxiety; and Prostate cancer (05-2015). And seizure disorder in excellent control on Keppra. No further stroke or TIA symptoms. Carotid Doppler  in May 2016 without change from 2015  Continue Plavix at current dose will renew Continue Keppra at current dose will renew Continue Tizanidine at the current dose, will renew Keep systolic blood pressure less than 130, today's reading 123/83 LDL less than 70 continue Lipitor, labs per primary care Wear leg brace at all times outside, using cane for gait stability, at risk for falls due to hemiparesis Continue to exercise by walking, healthy diet of fruits, vegetables and lean meats Follow-up yearly next visit with Dr. Lenis Dickinson Cecille Rubin, [facility], [facility], [facility] [address], [address], [place] [address] [phone]

## 2015-12-19 NOTE — Progress Notes (Signed)
I agree with the above plan 

## 2016-04-13 ENCOUNTER — Ambulatory Visit (INDEPENDENT_AMBULATORY_CARE_PROVIDER_SITE_OTHER): Payer: Medicare Other | Admitting: Cardiovascular Disease

## 2016-04-13 ENCOUNTER — Encounter: Payer: Self-pay | Admitting: Cardiovascular Disease

## 2016-04-13 VITALS — BP 105/62 | HR 63 | Ht 69.0 in | Wt 220.0 lb

## 2016-04-13 DIAGNOSIS — I25812 Atherosclerosis of bypass graft of coronary artery of transplanted heart without angina pectoris: Secondary | ICD-10-CM | POA: Diagnosis not present

## 2016-04-13 DIAGNOSIS — I779 Disorder of arteries and arterioles, unspecified: Secondary | ICD-10-CM | POA: Diagnosis not present

## 2016-04-13 DIAGNOSIS — E785 Hyperlipidemia, unspecified: Secondary | ICD-10-CM | POA: Diagnosis not present

## 2016-04-13 DIAGNOSIS — I10 Essential (primary) hypertension: Secondary | ICD-10-CM

## 2016-04-13 DIAGNOSIS — I739 Peripheral vascular disease, unspecified: Secondary | ICD-10-CM

## 2016-04-13 NOTE — Progress Notes (Signed)
Patient ID: Arthur Shon., male   DOB: February 09, 1956, 60 y.o.   MRN: [id number]      SUBJECTIVE: The patient presents for routine follow-up. He has a history of coronary artery disease and four-vessel CABG, hypertension, hyperlipidemia, carotid artery stenosis, prostate cancer, and stroke.   Echocardiogram performed on 09/30/15 demonstrated normal left ventricular systolic function, EF 0000000, mild LVH, grade 1 diastolic dysfunction, and mild aortic root dilatation.  Carotid Dopplers on 03/19/15 showed total occlusion of the right internal carotid artery and 50-69% left internal carotid artery stenosis.  He underwent CABG in 2000 at [facility]. 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 He was diagnosed with prostate cancer on 06/09/15.  Nuclear myocardial perfusion study 10/02/15 demonstrated prior myocardial infarction in the inferior and inferolateral walls with no evidence of ischemia. It was deemed a low risk study.  He denies chest pain and shortness of breath. No nitro use since MI/CABG.  Review of Systems: As per "subjective", otherwise negative.  Allergies  Allergen Reactions  . Lyrica [Pregabalin]     hallucination    Current Outpatient Prescriptions  Medication Sig Dispense Refill  . acetaminophen (TYLENOL) 500 MG tablet Take 1,000 mg by mouth every 6 (six) hours as needed.    Marland Kitchen atorvastatin (LIPITOR) 40 MG tablet Take 40 mg by mouth daily.    . clopidogrel (PLAVIX) 75 MG tablet Take 1 tablet (75 mg total) by mouth daily. 30 tablet 11  . diclofenac sodium (VOLTAREN) 1 % GEL Apply 1 application topically 4 (four) times daily as needed.     . hydrochlorothiazide (MICROZIDE) 12.5 MG capsule Take 1 capsule by mouth daily.    Marland Kitchen Leuprolide Acetate, 3 Month, (LUPRON DEPOT-PED, 26-MONTH, IM) Inject into the muscle every 3 (three) months.    . levETIRAcetam (KEPPRA) 250 MG tablet TAKE (1) TABLET TWICE A DAY. 60 tablet 11  . losartan (COZAAR) 25 MG tablet Take 25 mg by mouth  daily.    . nitroGLYCERIN (NITROSTAT) 0.4 MG SL tablet Place 0.4 mg under the tongue every 5 (five) minutes as needed.    . ranitidine (ZANTAC) 150 MG tablet Take 150 mg by mouth as needed for heartburn.    . sertraline (ZOLOFT) 50 MG tablet Take 1 tablet by mouth daily.    Marland Kitchen tiZANidine (ZANAFLEX) 2 MG tablet TAKE ONE TABLET AT BED-TIME WHEN NEEDED. 30 tablet 11   No current facility-administered medications for this visit.    Past Medical History  Diagnosis Date  . Hyperlipidemia   . Hypertension   . Stroke ([place])   . Diabetes mellitus     states no longer high blood sugars  . Coronary artery disease   . Anxiety   . Prostate cancer [facility]) 05-2015    radiation CC in [facility]    Past Surgical History  Procedure Laterality Date  . Coronary artery bypass graft      4 graft  . Prostate biopsy      Social History   Social History  . Marital Status: Married    Spouse Name: [name]  . Number of Children: 5  . Years of Education: 8th    Occupational History  .     Social History Main Topics  . Smoking status: Former Smoker -- 0.25 packs/day for 15 years    Types: Cigarettes    Start date: 10/19/1970    Quit date: 10/19/1985  . Smokeless tobacco: Current User    Types: Chew  . Alcohol Use: No  .  Drug Use: No  . Sexual Activity: Not Currently   Other Topics Concern  . Not on file   Social History Narrative   Patient lives at home with wife [name].    Patient has 5 children.    Patient has a 8th grade.    Patient is retired.    Patient is on disability.    Patient is right handed.    Patient drinks caffeine every once in a while.      Filed Vitals:   04/13/16 0957  BP: 105/62  Pulse: 63  Height: 5\' 9"  (1.753 m)  Weight: 220 lb (99.791 kg)    PHYSICAL EXAM General: NAD Neck: No JVD, no thyromegaly or thyroid nodule.  Lungs: Clear to auscultation bilaterally with normal respiratory effort. CV: Nondisplaced PMI. Regular rate and rhythm, normal S1/S2, no S3/S4, no  murmur. No peripheral edema. No carotid bruit.  Abdomen: Soft, nontender, obese.  Skin: Intact without lesions or rashes.  Neurologic: Alert and oriented x 3.  Psych: Normal affect. Extremities: No clubbing or cyanosis.  HEENT: Normal.    ECG: Most recent ECG reviewed.      ASSESSMENT AND PLAN: 1. CAD s/p CABG: Symptomatically stable. Stress test and echo results reviewed above. Continue Lipitor, Plavix, and Cozaar.  2. Essential HTN: Controlled on HCTZ 12.5 mg and losartan 25 mg. No changes.  3. Hyperlipidemia: Continue Lipitor 40 mg.  4. CVA: Continue Plavix.  5. Bilateral carotid artery stenosis: Dopplers from 03/19/15 reviewed above. Continue Plavix and Lipitor. Follows with vascular surgery.  Dispo: f/u 1 year.  Kate Sable, M.D., F.A.C.C.

## 2016-04-13 NOTE — Patient Instructions (Signed)
Continue all current medications. Your physician wants you to follow up in:  1 year.  You will receive a reminder letter in the mail one-two months in advance.  If you don't receive a letter, please call our office to schedule the follow up appointment   

## 2016-12-15 ENCOUNTER — Ambulatory Visit (INDEPENDENT_AMBULATORY_CARE_PROVIDER_SITE_OTHER): Payer: Medicare Other | Admitting: Neurology

## 2016-12-15 ENCOUNTER — Encounter: Payer: Self-pay | Admitting: Neurology

## 2016-12-15 VITALS — BP 122/77 | HR 63 | Wt 221.6 lb

## 2016-12-15 DIAGNOSIS — I619 Nontraumatic intracerebral hemorrhage, unspecified: Secondary | ICD-10-CM

## 2016-12-15 DIAGNOSIS — I6529 Occlusion and stenosis of unspecified carotid artery: Secondary | ICD-10-CM | POA: Diagnosis not present

## 2016-12-15 DIAGNOSIS — G8112 Spastic hemiplegia affecting left dominant side: Secondary | ICD-10-CM | POA: Diagnosis not present

## 2016-12-15 MED ORDER — TIZANIDINE HCL 2 MG PO TABS: ORAL_TABLET | ORAL | 11 refills | Status: DC

## 2016-12-15 MED ORDER — LEVETIRACETAM 250 MG PO TABS: ORAL_TABLET | ORAL | 11 refills | Status: DC

## 2016-12-15 NOTE — Patient Instructions (Addendum)
I had a long d/w patient and family about his remote stroke, spastic left hemiplegia, seizure disorder,risk for recurrent stroke/TIAs/ seizures, personally independently reviewed imaging studies and stroke evaluation results and answered questions.Continue Plavix for secondary stroke prevention and maintain strict control of hypertension with blood pressure goal below 130/90, diabetes with hemoglobin A1c goal below 6.5% and lipids with LDL cholesterol goal below 70 mg/dL. I also advised the patient to eat a healthy diet with plenty of whole grains, cereals, fruits and vegetables, exercise regularly and maintain ideal body weight. Continue Keppra in the current dose of 250 mg twice daily for seizures and Zanaflex for spasticity. Patient was given refills for both these medications upon request. Check screening carotid ultrasound study Followup in the future with my nurse practitioner in one year or call earlier if necessary

## 2016-12-15 NOTE — Progress Notes (Signed)
[facility]  PATIENT: Arthur Cisneros. DOB: [birth year]   REASON FOR VISIT: Follow-up for brain stem stroke, spastic hemiparesis affecting nondominant side, epilepsy with complex partial seizures HISTORY FROM: Patient and wife    HISTORY OF PRESENT ILLNESS:UPDATE 12/17/2015  Arthur Cisneros, 61year-old male returns for followup. He has a history of right MCA infarct in 2011 and also isolated seizure with secondary generalization in March of 2012 likely as late effect of his stroke. He remains on Keppra without further seizure activity, and he is on Plavix with minimal bruising without further stroke or TIA symptoms. He continues to wear AFO brace on the left lower leg. No falls since last seen. Doppler study May 2016 without change from the previous year. He denies any double vision, increased focal weakness, speech or swallowing difficulty, blackouts or confusion. He continues to walk around the house for exercise.he uses a single-point cane . His right parietal occipital transient headaches are improved and he no longer takes  Topamax. He returns for reevaluation. Recent diagnosis of prostate cancer and he just started treatments this past Monday for 8 weeks.    HISTORY: of right MCA infarct occurred August 2011. He had isolated partial seizure with secondary generalization in March 2012 likely as late effects from stroke. He has vascular risk factors of hypertension, hyperlipidemia, coronary artery disease and carotid occlusion. He says recent labs were good done at primary care. Blood pressure has been in good control but is slightly elevated today at 138/92. He denies double vision, loss of vision, focal weakness, numbness, speech or swallowing problems, confusions, blackouts. He continues to wear a brace on the left lower leg. Recent carotid Doppler studies without change from previous year.  Update 12/15/2016 : He returns for follow-up after last visit a year ago. He states his  continues to do well. His had no recurrent stroke or TIA symptoms since last 6 years. Is also not had any seizures last 5 years. He remains on Keppra 250 twice daily which is tolerating well. He continues to have spastic (but is able to walk with a cane. Does not have much use of his left arm. He was diagnosed with prostate cancer in March 2017 and has undergone radiation treatment and Ms. on 3 monthly injections. He states his starting Plavix well without bleeding or bruising. His blood pressure is well controlled and today it is 1-2/77. He is tolerating Lipitor well without myalgias and had last lipid profile checked in October 2017 which is satisfactory. He still gets occasional leg cramps and takes Zanaflex which seems to help him. He has not had carotid ultrasound study done in several years. He states he is trying to eat healthy and lose weight REVIEW OF SYSTEMS: Full 14 system review of systems performed and notable only for those listed, all others are neg:  Restless leg, snoring, headache and all other systems negative  ALLERGIES: Allergies  Allergen Reactions  . Lyrica [Pregabalin]     hallucination    HOME MEDICATIONS: Outpatient Medications Prior to Visit  Medication Sig Dispense Refill  . acetaminophen (TYLENOL) 500 MG tablet Take 1,000 mg by mouth every 6 (six) hours as needed.    Marland Kitchen atorvastatin (LIPITOR) 40 MG tablet Take 40 mg by mouth daily.    . clopidogrel (PLAVIX) 75 MG tablet Take 1 tablet (75 mg total) by mouth daily. 30 tablet 11  . hydrochlorothiazide (MICROZIDE) 12.5 MG capsule Take 1 capsule by mouth daily.    Marland Kitchen Leuprolide Acetate,  3 Month, (LUPRON DEPOT-PED, 51-MONTH, IM) Inject into the muscle every 3 (three) months.    Marland Kitchen losartan (COZAAR) 25 MG tablet Take 25 mg by mouth daily.    . nitroGLYCERIN (NITROSTAT) 0.4 MG SL tablet Place 0.4 mg under the tongue every 5 (five) minutes as needed.    . ranitidine (ZANTAC) 150 MG tablet Take 150 mg by mouth as needed for  heartburn.    . sertraline (ZOLOFT) 50 MG tablet Take 1 tablet by mouth daily.    Marland Kitchen levETIRAcetam (KEPPRA) 250 MG tablet TAKE (1) TABLET TWICE A DAY. 60 tablet 11  . tiZANidine (ZANAFLEX) 2 MG tablet TAKE ONE TABLET AT BED-TIME WHEN NEEDED. 30 tablet 11  . diclofenac sodium (VOLTAREN) 1 % GEL Apply 1 application topically 4 (four) times daily as needed.      No facility-administered medications prior to visit.     PAST MEDICAL HISTORY: Past Medical History:  Diagnosis Date  . Anxiety   . Coronary artery disease   . Diabetes mellitus    states no longer high blood sugars  . Hyperlipidemia   . Hypertension   . Prostate cancer ([place]) 05-2015   radiation CC in [place]  . Stroke [facility])     PAST SURGICAL HISTORY: Past Surgical History:  Procedure Laterality Date  . CORONARY ARTERY BYPASS GRAFT     4 graft  . PROSTATE BIOPSY      FAMILY HISTORY: Family History  Problem Relation Age of Onset  . Heart disease Mother   . Diabetes Mother   . Arthritis Mother   . Heart disease Father   . Diabetes Father   . Stroke Father   . Stroke Other   . Gout Other   . Coronary artery disease Other     SOCIAL HISTORY: Social History   Social History  . Marital status: Married    Spouse name: [name]  . Number of children: 5  . Years of education: 8th    Occupational History  .  Retired   Social History Main Topics  . Smoking status: Former Smoker    Packs/day: 0.25    Years: 15.00    Types: Cigarettes    Start date: 10/19/1970    Quit date: 10/19/1985  . Smokeless tobacco: Current User    Types: Chew  . Alcohol use No  . Drug use: No  . Sexual activity: Not Currently   Other Topics Concern  . Not on file   Social History Narrative   Patient lives at home with wife [name].    Patient has 5 children.    Patient has a 8th grade.    Patient is retired.    Patient is on disability.    Patient is right handed.    Patient drinks caffeine every once in a while.      PHYSICAL  EXAM  Vitals:   12/15/16 1012  BP: 122/77  Pulse: 63  Weight: 221 lb 9.6 oz (100.5 kg)   Body mass index is 32.72 kg/m. Generalized: Well developed middle aged 74 obese male, in no acute distress  Head: normocephalic and atraumatic,.   Neck: Supple, no carotid bruits . Cardiac: Regular rate rhythm, no murmur  Lungs: Clear to auscultation  Musculoskeletal: AFO to left leg,  Neurological examination   Mentation: Alert oriented to time, place, history taking. Follows all commands speech and language fluent  Cranial nerve II-XII: Fundoscopic exam not done.Pupils were equal round reactive to light extraocular movements were full, visual field  with dense left hemianopsia . Mild left lower facial weakness Hearing was intact to finger rubbing bilaterally. Uvula tongue midline. head turning and shoulder shrug were normal and symmetric.Tongue protrusion into cheek strength was normal. Motor: Spastic left hemiplegia with 1/5 upper extremity and 0/5 distal weakness with non-fixed flexion contracture and the fingers of the left hand. Left lower extremity is 4/5 and 1/5 ankle dorsiflexion and footdrop. Sensory: Intact to primary modalities on the right, decreased on the left Coordination: finger-nose-finger, heel-to-shin bilaterally, normal on the right unable to perform on the left  Reflexes: Brisker on the left, 1+ on the right Gait and Station: Gait is spastic hemiparetic with circumduction left leg uses a cane, drags his left foot   DIAGNOSTIC DATA (LABS, IMAGING, TESTING) -   ASSESSMENT AND PLAN 62 y.o. year old male has a past medical history of Hyperlipidemia; Hypertension; Stroke; Diabetes mellitus; Coronary artery disease; Anxiety; and Prostate cancer (05-2015). And seizure disorder in excellent control on Keppra. No further stroke or TIA symptoms. Carotid Doppler in May 2016 without change from 2015  I had a long d/w patient and family about his remote stroke, spastic left  hemiplegia, seizure disorder,risk for recurrent stroke/TIAs/ seizures, personally independently reviewed imaging studies and stroke evaluation results and answered questions.Continue Plavix for secondary stroke prevention and maintain strict control of hypertension with blood pressure goal below 130/90, diabetes with hemoglobin A1c goal below 6.5% and lipids with LDL cholesterol goal below 70 mg/dL. I also advised the patient to eat a healthy diet with plenty of whole grains, cereals, fruits and vegetables, exercise regularly and maintain ideal body weight. Continue Keppra in the current dose of 250 mg twice daily for seizures and Zanaflex for spasticity. Patient was given refills for both these medications upon request. Check screening carotid ultrasound study .greater than 50% time during this 25 minute visit was spent on counseling and coordination of care about stroke and seizure  risk, prevention and treatment Followup in the future with my nurse practitioner in one year or call earlier if necessary  Antony Contras, MD [facility] [address], [place], [place] [address] [phone]

## 2016-12-16 ENCOUNTER — Ambulatory Visit: Payer: Medicare Other | Admitting: Neurology

## 2016-12-20 ENCOUNTER — Other Ambulatory Visit: Payer: Self-pay | Admitting: *Deleted

## 2016-12-20 MED ORDER — CLOPIDOGREL BISULFATE 75 MG PO TABS: 75.0000 mg | ORAL_TABLET | Freq: Every day | ORAL | 11 refills | Status: DC

## 2016-12-29 ENCOUNTER — Ambulatory Visit (INDEPENDENT_AMBULATORY_CARE_PROVIDER_SITE_OTHER): Payer: Medicare Other

## 2016-12-29 DIAGNOSIS — I619 Nontraumatic intracerebral hemorrhage, unspecified: Secondary | ICD-10-CM

## 2016-12-29 DIAGNOSIS — I6529 Occlusion and stenosis of unspecified carotid artery: Secondary | ICD-10-CM | POA: Diagnosis not present

## 2016-12-29 DIAGNOSIS — G8112 Spastic hemiplegia affecting left dominant side: Secondary | ICD-10-CM

## 2017-01-19 ENCOUNTER — Telehealth: Payer: Self-pay

## 2017-01-19 NOTE — Telephone Encounter (Signed)
Rn call patient about his carotid ultrasound shows study shows persistent chronic right ICA occlusion. Presence of plaque on the left side but with improvement compared with previous study from May 2016  Pt verbalized understanding.

## 2017-01-19 NOTE — Telephone Encounter (Signed)
-----   Message from Garvin Fila, MD sent at 01/18/2017  2:34 PM EST ----- Kindly inform the patient that carotid ultrasound study shows persistent chronic right ICA occlusion. Presence of plaque on the left side but with improvement compared with previous study from May 2016

## 2017-04-12 ENCOUNTER — Ambulatory Visit (INDEPENDENT_AMBULATORY_CARE_PROVIDER_SITE_OTHER): Payer: Medicare Other | Admitting: Cardiovascular Disease

## 2017-04-12 ENCOUNTER — Encounter: Payer: Self-pay | Admitting: Cardiovascular Disease

## 2017-04-12 VITALS — BP 142/82 | HR 68 | Ht 69.0 in | Wt 220.0 lb

## 2017-04-12 DIAGNOSIS — I639 Cerebral infarction, unspecified: Secondary | ICD-10-CM | POA: Diagnosis not present

## 2017-04-12 DIAGNOSIS — E7800 Pure hypercholesterolemia, unspecified: Secondary | ICD-10-CM | POA: Diagnosis not present

## 2017-04-12 DIAGNOSIS — I25810 Atherosclerosis of coronary artery bypass graft(s) without angina pectoris: Secondary | ICD-10-CM | POA: Diagnosis not present

## 2017-04-12 DIAGNOSIS — I10 Essential (primary) hypertension: Secondary | ICD-10-CM

## 2017-04-12 DIAGNOSIS — I6523 Occlusion and stenosis of bilateral carotid arteries: Secondary | ICD-10-CM

## 2017-04-12 NOTE — Patient Instructions (Signed)

## 2017-04-12 NOTE — Progress Notes (Signed)
SUBJECTIVE: The patient presents for routine follow-up. He has a history of coronary artery disease and four-vessel CABG, hypertension, hyperlipidemia, carotid artery stenosis, prostate cancer, and right MCA infarct in 2011 with seizures.  Echocardiogram performed on 09/30/15 demonstrated normal left ventricular systolic function, EF 10-93%, mild LVH, grade 1 diastolic dysfunction, and mild aortic root dilatation.  Carotid Dopplers on 12/29/16 showed total occlusion of the right internal carotid artery and plaque disease (ungraded) in the left internal carotid artery.  He underwent CABG in 2000 at [facility]. 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 He was diagnosed with prostate cancer on 06/09/15.  Nuclear myocardial perfusion study 10/02/15 demonstrated prior myocardial infarction in the inferior and inferolateral walls with no evidence of ischemia. It was deemed a low risk study.  ECG performed in the office today which I ordered and personally interpreted demonstrates normal sinus rhythm with old inferior infarct, incomplete right bundle-branch block, and nonspecific T wave abnormality.  He denies chest pain, shortness of breath, leg swelling, and palpitations. He has bilateral leg pain with walking. He wears a left leg brace. He has not had use nitroglycerin. He occasionally has lower back pain.    Review of Systems: As per "subjective", otherwise negative.  Allergies  Allergen Reactions  . Lyrica [Pregabalin]     hallucination    Current Outpatient Prescriptions  Medication Sig Dispense Refill  . acetaminophen (TYLENOL) 500 MG tablet Take 1,000 mg by mouth every 6 (six) hours as needed.    Marland Kitchen atorvastatin (LIPITOR) 40 MG tablet Take 40 mg by mouth daily.    . clopidogrel (PLAVIX) 75 MG tablet Take 1 tablet (75 mg total) by mouth daily. 30 tablet 11  . hydrochlorothiazide (MICROZIDE) 12.5 MG capsule Take 1 capsule by mouth daily.    Marland Kitchen Leuprolide Acetate, 3  Month, (LUPRON DEPOT-PED, 72-MONTH, IM) Inject into the muscle every 3 (three) months.    . levETIRAcetam (KEPPRA) 250 MG tablet TAKE (1) TABLET TWICE A DAY. 60 tablet 11  . losartan (COZAAR) 25 MG tablet Take 25 mg by mouth daily.    . nitroGLYCERIN (NITROSTAT) 0.4 MG SL tablet Place 0.4 mg under the tongue every 5 (five) minutes as needed.    . ranitidine (ZANTAC) 150 MG tablet Take 150 mg by mouth as needed for heartburn.    . sertraline (ZOLOFT) 50 MG tablet Take 1 tablet by mouth daily.    Marland Kitchen tiZANidine (ZANAFLEX) 2 MG tablet TAKE ONE TABLET AT BED-TIME WHEN NEEDED. 30 tablet 11   No current facility-administered medications for this visit.     Past Medical History:  Diagnosis Date  . Anxiety   . Coronary artery disease   . Diabetes mellitus    states no longer high blood sugars  . Hyperlipidemia   . Hypertension   . Prostate cancer ([place]) 05-2015   radiation CC in [place]  . Stroke [facility])     Past Surgical History:  Procedure Laterality Date  . CORONARY ARTERY BYPASS GRAFT     4 graft  . PROSTATE BIOPSY      Social History   Social History  . Marital status: Married    Spouse name: [name]  . Number of children: 5  . Years of education: 8th    Occupational History  .  Retired   Social History Main Topics  . Smoking status: Former Smoker    Packs/day: 0.25    Years: 15.00    Types: Cigarettes  Start date: 10/19/1970    Quit date: 10/19/1985  . Smokeless tobacco: Current User    Types: Chew  . Alcohol use No  . Drug use: No  . Sexual activity: Not Currently   Other Topics Concern  . Not on file   Social History Narrative   Patient lives at home with wife [name].    Patient has 5 children.    Patient has a 8th grade.    Patient is retired.    Patient is on disability.    Patient is right handed.    Patient drinks caffeine every once in a while.      Vitals:   04/12/17 1257  BP: (!) 142/82  Pulse: 68  SpO2: 95%  Weight: 220 lb (99.8 kg)  Height: 5\' 9"   (1.753 m)    Wt Readings from Last 3 Encounters:  04/12/17 220 lb (99.8 kg)  12/15/16 221 lb 9.6 oz (100.5 kg)  04/13/16 220 lb (99.8 kg)     PHYSICAL EXAM General: NAD HEENT: Normal. Neck: No JVD, no thyromegaly. Lungs: Clear to auscultation bilaterally with normal respiratory effort. CV: Nondisplaced PMI.  Regular rate and rhythm, normal S1/S2, no S3/S4, no murmur. No pretibial or periankle edema.  No carotid bruit.   Abdomen: Obese. Neurologic: Alert and oriented.  Psych: Normal affect. Skin: Normal. Musculoskeletal: Left leg brace.    ECG: Most recent ECG reviewed.   Labs: Lab Results  Component Value Date/Time   K 4.1 01/19/2011 01:18 PM   BUN 11 01/19/2011 01:18 PM   CREATININE 0.63 01/19/2011 01:18 PM   ALT 22 01/19/2011 01:18 PM   HGB 14.4 01/18/2011 03:09 PM     Lipids: Lab Results  Component Value Date/Time   LDLCALC (H) 07/09/2010 03:40 AM    127        Total Cholesterol/HDL:CHD Risk Coronary Heart Disease Risk Table                     Men   Women  1/2 Average Risk   3.4   3.3  Average Risk       5.0   4.4  2 X Average Risk   9.6   7.1  3 X Average Risk  23.4   11.0        Use the calculated Patient Ratio above and the CHD Risk Table to determine the patient's CHD Risk.        ATP III CLASSIFICATION (LDL):  <100     mg/dL   Optimal  100-129  mg/dL   Near or Above                    Optimal  130-159  mg/dL   Borderline  160-189  mg/dL   High  >190     mg/dL   Very High   CHOL  07/09/2010 03:40 AM    193        ATP III CLASSIFICATION:  <200     mg/dL   Desirable  200-239  mg/dL   Borderline High  >=240    mg/dL   High          TRIG 174 (H) 07/09/2010 03:40 AM   HDL 31 (L) 07/09/2010 03:40 AM       ASSESSMENT AND PLAN: 1. CAD s/p CABG: Symptomatically stable. Stress test and echocardiogram results reviewed above. Continue Lipitor, Plavix, and Cozaar.  2. Essential HTN: Mildly elevated. This needs to be monitored. Currently on  hydrochlorothiazide 12.5 mg and losartan 25 mg.  3. Hyperlipidemia: Continue Lipitor 40 mg. I will obtain a copy of most recent lipids.  4. CVA: Continue Plavix.  5. Bilateral carotid artery stenosis: Dopplers from 12/29/16 reviewed above. Continue Plavix and Lipitor. Follows with vascular surgery.    Disposition: Follow up 1 yr  Kate Sable, M.D., F.A.C.C.

## 2017-12-14 NOTE — Progress Notes (Signed)
[facility]  PATIENT: Arthur Cisneros. DOB: 1956-09-21   REASON FOR VISIT: Follow-up for history of stroke, follow-up for seizure disorder HISTORY FROM: Patient    HISTORY OF PRESENT ILLNESS:UPDATE 12/17/2015  Arthur Cisneros, 62year-old male returns for followup. He has a history of right MCA infarct in 2011 and also isolated seizure with secondary generalization in March of 2012 likely as late effect of his stroke. He remains on Keppra without further seizure activity, and he is on Plavix with minimal bruising without further stroke or TIA symptoms. He continues to wear AFO brace on the left lower leg. No falls since last seen. Doppler study May 2016 without change from the previous year. He denies any double vision, increased focal weakness, speech or swallowing difficulty, blackouts or confusion. He continues to walk around the house for exercise.he uses a single-point cane . His right parietal occipital transient headaches are improved and he no longer takes  Topamax. He returns for reevaluation. Recent diagnosis of prostate cancer and he just started treatments this past Monday for 8 weeks.    HISTORY: of right MCA infarct occurred August 2011. He had isolated partial seizure with secondary generalization in March 2012 likely as late effects from stroke. He has vascular risk factors of hypertension, hyperlipidemia, coronary artery disease and carotid occlusion. He says recent labs were good done at primary care. Blood pressure has been in good control but is slightly elevated today at 138/92. He denies double vision, loss of vision, focal weakness, numbness, speech or swallowing problems, confusions, blackouts. He continues to wear a brace on the left lower leg. Recent carotid Doppler studies without change from previous year.  Update 12/15/2016 PS : He returns for follow-up after last visit a year ago. He states his continues to do well. His had no recurrent stroke or TIA  symptoms since last 6 years. Is also not had any seizures last 5 years. He remains on Keppra 250 twice daily which is tolerating well. He continues to have spastic (but is able to walk with a cane. Does not have much use of his left arm. He was diagnosed with prostate cancer in March 2017 and has undergone radiation treatment and  on 3 monthly injections. He states his starting Plavix well without bleeding or bruising. His blood pressure is well controlled and today it is 1-2/77. He is tolerating Lipitor well without myalgias and had last lipid profile checked in October 2017 which is satisfactory. He still gets occasional leg cramps and takes Zanaflex which seems to help him. He has not had carotid ultrasound study done in several years. He states he is trying to eat healthy and lose weight UPDATE 12/18/2017 Arthur Cisneros, 62 year old male returns for follow-up with history of stroke event 6-1/2 years ago.  He remains on Plavix further stroke or TIA symptoms.  He has minimal bruising and no bleeding.  He also has history of seizure disorder but no seizures in 6 years.  He remains on Keppra 250 mg twice daily.  Blood pressure in the office today 117/79.  He remains on Lipitor without myalgias.  He denies any falls.  He has AFO left lower leg.  He continues to take Zanaflex for cramping.  Carotid ultrasound after his last visit shows persistent chronic right ICA occlusion.  Presence of plaque on the left side but with improvement compared with previous study in May 2016.  Will repeat next year.  He was diagnosed with prostate cancer and had radiation.  He  has frequent accidents and wears depends.  He returns for reevaluation  REVIEW OF SYSTEMS: Full 14 system review of systems performed and notable only for those listed, all others are neg:  Constitutional: neg  Cardiovascular: neg Ear/Nose/Throat: neg  Skin: neg Eyes: neg Respiratory: neg Gastroitestinal: neg  Hematology/Lymphatic: neg  Endocrine:  neg Musculoskeletal: Gait abnormality Allergy/Immunology: neg Neurological: History of stroke and seizure disorder Psychiatric: Depression anxiety Sleep : neg   ALLERGIES: Allergies  Allergen Reactions  . Lyrica [Pregabalin]     hallucination    HOME MEDICATIONS: Outpatient Medications Prior to Visit  Medication Sig Dispense Refill  . acetaminophen (TYLENOL) 500 MG tablet Take 1,000 mg by mouth every 6 (six) hours as needed.    Marland Kitchen atorvastatin (LIPITOR) 40 MG tablet Take 40 mg by mouth daily.    . clopidogrel (PLAVIX) 75 MG tablet Take 1 tablet (75 mg total) by mouth daily. 30 tablet 11  . hydrochlorothiazide (MICROZIDE) 12.5 MG capsule Take 1 capsule by mouth daily.    Marland Kitchen levETIRAcetam (KEPPRA) 250 MG tablet TAKE (1) TABLET TWICE A DAY. 60 tablet 11  . losartan (COZAAR) 25 MG tablet Take 25 mg by mouth daily.    . nitroGLYCERIN (NITROSTAT) 0.4 MG SL tablet Place 0.4 mg under the tongue every 5 (five) minutes as needed.    . ranitidine (ZANTAC) 150 MG tablet Take 150 mg by mouth as needed for heartburn.    Marland Kitchen tiZANidine (ZANAFLEX) 2 MG tablet TAKE ONE TABLET AT BED-TIME WHEN NEEDED. 30 tablet 11  . sertraline (ZOLOFT) 50 MG tablet Take 1 tablet by mouth daily.    Marland Kitchen Leuprolide Acetate, 3 Month, (LUPRON DEPOT-PED, 60-MONTH, IM) Inject into the muscle every 3 (three) months.     No facility-administered medications prior to visit.     PAST MEDICAL HISTORY: Past Medical History:  Diagnosis Date  . Anxiety   . Coronary artery disease   . Diabetes mellitus    states no longer high blood sugars  . Hyperlipidemia   . Hypertension   . Prostate cancer ([place]) 05-2015   radiation CC in [place]  . Stroke [facility])     PAST SURGICAL HISTORY: Past Surgical History:  Procedure Laterality Date  . CORONARY ARTERY BYPASS GRAFT     4 graft  . PROSTATE BIOPSY      FAMILY HISTORY: Family History  Problem Relation Age of Onset  . Heart disease Mother   . Diabetes Mother   . Arthritis Mother    . Heart disease Father   . Diabetes Father   . Stroke Father   . Stroke Other   . Gout Other   . Coronary artery disease Other     SOCIAL HISTORY: Social History   Socioeconomic History  . Marital status: Married    Spouse name: [name]  . Number of children: 5  . Years of education: 8th   . Highest education level: Not on file  Social Needs  . Financial resource strain: Not on file  . Food insecurity - worry: Not on file  . Food insecurity - inability: Not on file  . Transportation needs - medical: Not on file  . Transportation needs - non-medical: Not on file  Occupational History    Employer: RETIRED  Tobacco Use  . Smoking status: Former Smoker    Packs/day: 0.25    Years: 15.00    Pack years: 3.75    Types: Cigarettes    Start date: 10/19/1970    Last attempt to  quit: 10/19/1985    Years since quitting: 32.1  . Smokeless tobacco: Current User    Types: Chew  Substance and Sexual Activity  . Alcohol use: No    Alcohol/week: 0.0 oz  . Drug use: No  . Sexual activity: Not Currently  Other Topics Concern  . Not on file  Social History Narrative   Patient lives at home with wife [name].    Patient has 5 children.    Patient has a 8th grade.    Patient is retired.    Patient is on disability.    Patient is right handed.    Patient drinks caffeine every once in a while.      PHYSICAL EXAM  Vitals:   12/18/17 0957  BP: 117/79  Pulse: 69  Weight: 224 lb 6.4 oz (101.8 kg)  Height: '5\' 9"'$  (1.753 m)   Body mass index is 33.14 kg/m.  Generalized: Well developed, obese male in no acute distress  Head: normocephalic and atraumatic,. Oropharynx benign  Neck: Supple, no carotid bruits  Cardiac: Regular rate rhythm, no murmur  Musculoskeletal: AFO left leg  Neurological examination   Mentation: Alert oriented to time, place, history taking. Attention span and concentration appropriate. Recent and remote memory intact.  Follows all commands speech and language  fluent.   Cranial nerve II-XII: Fundoscopic exam not done.Pupils were equal round reactive to light extraocular movements were full visual fields with dense left hemianopsia, mild left lower facial weakness, hearing was intact to finger rubbing bilaterally. Uvula tongue midline. head turning and shoulder shrug were normal and symmetric.Tongue protrusion into cheek strength was normal. Motor: Spastic left hemiplegia with 1 out of 5 upper extremity and 05 distal weakness non-fixed flexion contracture of the fingers of the left hand.  Left lower extremity is 4 out of 5 in 1 out of 5 ankle dorsiflexion and foot drop.  Sensory: Intact to all modalities on the right decreased on the left   Coordination: finger-nose-finger, heel-to-shin bilaterally, normal on the right unable to perform on the left  Reflexes: 1+ in the right brisker on the left . Gait and Station: Spastic left hemiparetic gait circumduction left leg, drags his left foot uses a cane  DIAGNOSTIC DATA (LABS, IMAGING, TESTING) - I reviewed patient records, labs, notes, testing and imaging myself where available.  Lab Results  Component Value Date   WBC 7.3 01/18/2011   HGB 14.4 01/18/2011   HCT 42.1 01/18/2011   MCV 87.5 01/18/2011   PLT 208 01/18/2011      Component Value Date/Time   NA 140 01/19/2011 1318   K 4.1 01/19/2011 1318   CL 107 01/19/2011 1318   CO2 24 01/19/2011 1318   GLUCOSE 121 (H) 01/19/2011 1318   BUN 11 01/19/2011 1318   CREATININE 0.63 01/19/2011 1318   CALCIUM 9.0 01/19/2011 1318   PROT 6.2 01/19/2011 1318   ALBUMIN 3.8 01/19/2011 1318   AST 18 01/19/2011 1318   ALT 22 01/19/2011 1318   ALKPHOS 23 (L) 01/19/2011 1318   BILITOT 0.6 01/19/2011 1318   GFRNONAA >60 01/19/2011 1318   GFRAA  01/19/2011 1318    >60        The eGFR has been calculated using the MDRD equation. This calculation has not been validated in all clinical situations. eGFR's persistently <60 mL/min signify possible Chronic Kidney  Disease.      ASSESSMENT AND PLAN 62 y.o. year old male has a past medical history of Hyperlipidemia; Hypertension; Stroke; Diabetes mellitus;  Coronary artery disease; Anxiety; and Prostate cancer (05-2015). And seizure disorder in excellent control on Keppra. No further stroke or TIA symptoms. Carotid Doppler in March 2018  without change from 2016 The patient is a current patient of Dr. Leonie Man  who is out of the office today . This note is sent to the work in doctor.     PLAN:  Continue Plavix at current dose will renew Continue Keppra at current dose will renew Continue Tizanidine at the current dose, will renew Keep systolic blood pressure less than 130, today's reading 117/79 LDL less than 70 continue Lipitor, labs per primary care Wear leg brace at all times outside, using cane for gait stability, at risk for falls due to hemiparesis Continue to exercise by walking, healthy diet of fruits, vegetables and lean meats Follow-up yearly  I spent 25 min  in total face to face time with the patient more than 50% of which was spent counseling and coordination of care, reviewing test results reviewing medications and discussing and reviewing the diagnosis of stroke and management of risk factors, seizure disorder and spasticity and further treatment options. , Rayburn Ma, [facility], APRN  [facility] [address], [place], [place] [address] [phone]

## 2017-12-18 ENCOUNTER — Ambulatory Visit: Payer: Medicare Other | Admitting: Nurse Practitioner

## 2017-12-18 ENCOUNTER — Encounter (INDEPENDENT_AMBULATORY_CARE_PROVIDER_SITE_OTHER): Payer: Self-pay

## 2017-12-18 ENCOUNTER — Encounter: Payer: Self-pay | Admitting: Nurse Practitioner

## 2017-12-18 VITALS — BP 117/79 | HR 69 | Ht 69.0 in | Wt 224.4 lb

## 2017-12-18 DIAGNOSIS — G8110 Spastic hemiplegia affecting unspecified side: Secondary | ICD-10-CM | POA: Diagnosis not present

## 2017-12-18 DIAGNOSIS — G40209 Localization-related (focal) (partial) symptomatic epilepsy and epileptic syndromes with complex partial seizures, not intractable, without status epilepticus: Secondary | ICD-10-CM | POA: Diagnosis not present

## 2017-12-18 DIAGNOSIS — E785 Hyperlipidemia, unspecified: Secondary | ICD-10-CM

## 2017-12-18 DIAGNOSIS — I10 Essential (primary) hypertension: Secondary | ICD-10-CM | POA: Diagnosis not present

## 2017-12-18 DIAGNOSIS — Z8673 Personal history of transient ischemic attack (TIA), and cerebral infarction without residual deficits: Secondary | ICD-10-CM | POA: Diagnosis not present

## 2017-12-18 MED ORDER — LEVETIRACETAM 250 MG PO TABS: ORAL_TABLET | ORAL | 11 refills | Status: DC

## 2017-12-18 MED ORDER — TIZANIDINE HCL 2 MG PO TABS: ORAL_TABLET | ORAL | 11 refills | Status: DC

## 2017-12-18 MED ORDER — CLOPIDOGREL BISULFATE 75 MG PO TABS: 75.0000 mg | ORAL_TABLET | Freq: Every day | ORAL | 11 refills | Status: DC

## 2017-12-18 NOTE — Patient Instructions (Signed)
Continue Plavix at current dose will renew Continue Keppra at current dose will renew Continue Tizanidine at the current dose, will renew Keep systolic blood pressure less than 130, today's reading 117/79 LDL less than 70 continue Lipitor, labs per primary care Wear leg brace at all times outside, using cane for gait stability, at risk for falls due to hemiparesis Continue to exercise by walking, healthy diet of fruits, vegetables and lean meats Follow-up yearly

## 2017-12-19 NOTE — Progress Notes (Signed)
I reviewed note and agree with plan.   Penni Bombard, MD 06/22/2799, 3:49 PM Certified in Neurology, Neurophysiology and Neuroimaging  [facility] [address] [place], [place] [address] [phone]

## 2018-04-16 ENCOUNTER — Encounter: Payer: Self-pay | Admitting: *Deleted

## 2018-04-16 ENCOUNTER — Ambulatory Visit (INDEPENDENT_AMBULATORY_CARE_PROVIDER_SITE_OTHER): Payer: Medicare Other | Admitting: Cardiovascular Disease

## 2018-04-16 ENCOUNTER — Encounter: Payer: Self-pay | Admitting: Cardiovascular Disease

## 2018-04-16 VITALS — BP 118/72 | HR 69 | Ht 69.0 in | Wt 221.0 lb

## 2018-04-16 DIAGNOSIS — I10 Essential (primary) hypertension: Secondary | ICD-10-CM

## 2018-04-16 DIAGNOSIS — I6523 Occlusion and stenosis of bilateral carotid arteries: Secondary | ICD-10-CM

## 2018-04-16 DIAGNOSIS — E785 Hyperlipidemia, unspecified: Secondary | ICD-10-CM | POA: Diagnosis not present

## 2018-04-16 DIAGNOSIS — Z8673 Personal history of transient ischemic attack (TIA), and cerebral infarction without residual deficits: Secondary | ICD-10-CM

## 2018-04-16 DIAGNOSIS — I25708 Atherosclerosis of coronary artery bypass graft(s), unspecified, with other forms of angina pectoris: Secondary | ICD-10-CM | POA: Diagnosis not present

## 2018-04-16 NOTE — Progress Notes (Signed)
SUBJECTIVE: The patient presents for routine follow-up. He has a history of coronary artery disease and four-vessel CABG, hypertension, hyperlipidemia, carotid artery stenosis, prostate cancer, and right MCA infarct in 2011 with seizures.  Echocardiogram performed on 09/30/15 demonstrated normal left ventricular systolic function, EF 40-98%, mild LVH, grade 1 diastolic dysfunction, and mild aortic root dilatation.  Carotid Dopplers on 12/29/16 showed total occlusion of the right internal carotid artery and plaque disease (ungraded) in the left internal carotid artery.  He underwent [facility]G in [facility] at [facility]. 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 He was diagnosed with prostate cancer on 06/09/15.  Nuclear myocardial perfusion study 10/02/15 demonstrated prior myocardial infarction in the inferior and inferolateral walls with no evidence of ischemia. It was deemed a low risk study.  He denies chest pain, palpitations, leg swelling, orthopnea, and shortness of breath.  He walks with a cane.  He has a left leg brace.     Review of Systems: As per "subjective", otherwise negative.  Allergies  Allergen Reactions  . Lyrica [Pregabalin]     hallucination    Current Outpatient Medications  Medication Sig Dispense Refill  . acetaminophen (TYLENOL) 500 MG tablet Take 1,000 mg by mouth every 6 (six) hours as needed.    Marland Kitchen atorvastatin (LIPITOR) 40 MG tablet Take 40 mg by mouth daily.    . clopidogrel (PLAVIX) 75 MG tablet Take 1 tablet (75 mg total) by mouth daily. 30 tablet 11  . levETIRAcetam (KEPPRA) 250 MG tablet TAKE (1) TABLET TWICE A DAY. 60 tablet 11  . losartan (COZAAR) 25 MG tablet Take 25 mg by mouth daily.    . metFORMIN (GLUCOPHAGE) 500 MG tablet Take 500 mg by mouth 2 (two) times daily with a meal.    . nitroGLYCERIN (NITROSTAT) 0.4 MG SL tablet Place 0.4 mg under the tongue every 5 (five) minutes as needed.    . ranitidine (ZANTAC) 150 MG tablet Take 150 mg  by mouth as needed for heartburn.    Marland Kitchen tiZANidine (ZANAFLEX) 2 MG tablet TAKE ONE TABLET AT BED-TIME WHEN NEEDED. 30 tablet 11  . venlafaxine (EFFEXOR) 25 MG tablet Take 12.5 mg by mouth 2 (two) times daily with a meal.     No current facility-administered medications for this visit.     Past Medical History:  Diagnosis Date  . Anxiety   . Coronary artery disease   . Diabetes mellitus    states no longer high blood sugars  . Hyperlipidemia   . Hypertension   . Prostate cancer ([place]) 05-2015   radiation CC in [place]  . Stroke [facility])     Past Surgical History:  Procedure Laterality Date  . CORONARY ARTERY BYPASS GRAFT     4 graft  . PROSTATE BIOPSY      Social History   Socioeconomic History  . Marital status: Married    Spouse name: [name]  . Number of children: 5  . Years of education: 8th   . Highest education level: Not on file  Occupational History    Employer: RETIRED  Social Needs  . Financial resource strain: Not on file  . Food insecurity:    Worry: Not on file    Inability: Not on file  . Transportation needs:    Medical: Not on file    Non-medical: Not on file  Tobacco Use  . Smoking status: Former Smoker    Packs/day: 0.25    Years: 15.00  Pack years: 3.75    Types: Cigarettes    Start date: 10/19/1970    Last attempt to quit: 10/19/1985    Years since quitting: 32.5  . Smokeless tobacco: Current User    Types: Chew  Substance and Sexual Activity  . Alcohol use: No    Alcohol/week: 0.0 oz  . Drug use: No  . Sexual activity: Not Currently  Lifestyle  . Physical activity:    Days per week: Not on file    Minutes per session: Not on file  . Stress: Not on file  Relationships  . Social connections:    Talks on phone: Not on file    Gets together: Not on file    Attends religious service: Not on file    Active member of club or organization: Not on file    Attends meetings of clubs or organizations: Not on file    Relationship status: Not on  file  . Intimate partner violence:    Fear of current or ex partner: Not on file    Emotionally abused: Not on file    Physically abused: Not on file    Forced sexual activity: Not on file  Other Topics Concern  . Not on file  Social History Narrative   Patient lives at home with wife [name].    Patient has 5 children.    Patient has a 8th grade.    Patient is retired.    Patient is on disability.    Patient is right handed.    Patient drinks caffeine every once in a while.      Vitals:   04/16/18 1115  BP: 118/72  Pulse: 69  SpO2: 96%  Weight: 221 lb (100.2 kg)  Height: 5\' 9"  (1.753 m)    Wt Readings from Last 3 Encounters:  04/16/18 221 lb (100.2 kg)  12/18/17 224 lb 6.4 oz (101.8 kg)  04/12/17 220 lb (99.8 kg)     PHYSICAL EXAM General: NAD HEENT: Normal. Neck: No JVD, no thyromegaly. Lungs: Clear to auscultation bilaterally with normal respiratory effort. CV: Regular rate and rhythm, normal S1/S2, no S3/S4, no murmur. No pretibial or periankle edema.  No carotid bruit.   Abdomen: Soft, nontender, obese.  Neurologic: Alert and oriented.  Psych: Normal affect. Skin: Normal. Musculoskeletal: No gross deformities.  Wearing a left leg brace.    ECG: Most recent ECG reviewed.   Labs: Lab Results  Component Value Date/Time   K 4.1 01/19/2011 01:18 PM   BUN 11 01/19/2011 01:18 PM   CREATININE 0.63 01/19/2011 01:18 PM   ALT 22 01/19/2011 01:18 PM   HGB 14.4 01/18/2011 03:09 PM     Lipids: Lab Results  Component Value Date/Time   LDLCALC (H) 07/09/2010 03:40 AM    127        Total Cholesterol/HDL:CHD Risk Coronary Heart Disease Risk Table                     Men   Women  1/2 Average Risk   3.4   3.3  Average Risk       5.0   4.4  2 X Average Risk   9.6   7.1  3 X Average Risk  23.4   11.0        Use the calculated Patient Ratio above and the CHD Risk Table to determine the patient's CHD Risk.        ATP III CLASSIFICATION (LDL):  <100  mg/dL    Optimal  100-129  mg/dL   Near or Above                    Optimal  130-159  mg/dL   Borderline  160-189  mg/dL   High  >190     mg/dL   Very High   CHOL  07/09/2010 03:40 AM    193        ATP III CLASSIFICATION:  <200     mg/dL   Desirable  200-239  mg/dL   Borderline High  >=240    mg/dL   High          TRIG 174 (H) 07/09/2010 03:40 AM   HDL 31 (L) 07/09/2010 03:40 AM       ASSESSMENT AND PLAN:  1. CAD s/p CABG: Symptomatically stable. Stress test and echocardiogram results reviewed above. Continue Lipitor, Plavix, and losartan.  2. Essential HTN: Controlled on present therapy.  No changes.  3. Hyperlipidemia: Continue Lipitor 40 mg. I will obtain a copy of most recent lipids.  4. CVA: Continue Plavix.  5. Bilateral carotid artery stenosis: Dopplers from 12/29/16 reviewed above. Continue Plavix and Lipitor. Follows with vascular surgery.     Disposition: Follow up 1 year   Kate Sable, M.D., F.A.C.C.

## 2018-04-16 NOTE — Patient Instructions (Signed)

## 2018-12-18 ENCOUNTER — Ambulatory Visit: Payer: Medicare Other | Admitting: Nurse Practitioner

## 2018-12-24 ENCOUNTER — Ambulatory Visit: Payer: Medicare Other | Admitting: Neurology

## 2018-12-27 ENCOUNTER — Other Ambulatory Visit: Payer: Self-pay | Admitting: Nurse Practitioner

## 2019-01-08 ENCOUNTER — Other Ambulatory Visit: Payer: Self-pay | Admitting: Nurse Practitioner

## 2019-01-08 ENCOUNTER — Ambulatory Visit: Payer: Medicare Other | Admitting: Neurology

## 2019-01-08 ENCOUNTER — Encounter: Payer: Self-pay | Admitting: Neurology

## 2019-01-08 VITALS — BP 118/81 | HR 63 | Ht 69.0 in | Wt 213.6 lb

## 2019-01-08 DIAGNOSIS — I6521 Occlusion and stenosis of right carotid artery: Secondary | ICD-10-CM

## 2019-01-08 MED ORDER — LEVETIRACETAM 250 MG PO TABS: ORAL_TABLET | ORAL | 3 refills | Status: DC

## 2019-01-08 NOTE — Progress Notes (Signed)
[facility]  PATIENT: Arthur Cisneros. DOB: 1956-09-21   REASON FOR VISIT: Follow-up for history of stroke, follow-up for seizure disorder HISTORY FROM: Patient    HISTORY OF PRESENT ILLNESS:UPDATE 12/17/2015  Arthur Cisneros, 63year-old male returns for followup. He has a history of right MCA infarct in 2011 and also isolated seizure with secondary generalization in March of 2012 likely as late effect of his stroke. He remains on Keppra without further seizure activity, and he is on Plavix with minimal bruising without further stroke or TIA symptoms. He continues to wear AFO brace on the left lower leg. No falls since last seen. Doppler study May 2016 without change from the previous year. He denies any double vision, increased focal weakness, speech or swallowing difficulty, blackouts or confusion. He continues to walk around the house for exercise.he uses a single-point cane . His right parietal occipital transient headaches are improved and he no longer takes  Topamax. He returns for reevaluation. Recent diagnosis of prostate cancer and he just started treatments this past Monday for 8 weeks.    HISTORY: of right MCA infarct occurred August 2011. He had isolated partial seizure with secondary generalization in March 2012 likely as late effects from stroke. He has vascular risk factors of hypertension, hyperlipidemia, coronary artery disease and carotid occlusion. He says recent labs were good done at primary care. Blood pressure has been in good control but is slightly elevated today at 138/92. He denies double vision, loss of vision, focal weakness, numbness, speech or swallowing problems, confusions, blackouts. He continues to wear a brace on the left lower leg. Recent carotid Doppler studies without change from previous year.  Update 12/15/2016 PS : He returns for follow-up after last visit a year ago. He states his continues to do well. His had no recurrent stroke or TIA  symptoms since last 6 years. Is also not had any seizures last 5 years. He remains on Keppra 250 twice daily which is tolerating well. He continues to have spastic (but is able to walk with a cane. Does not have much use of his left arm. He was diagnosed with prostate cancer in March 2017 and has undergone radiation treatment and  on 3 monthly injections. He states his starting Plavix well without bleeding or bruising. His blood pressure is well controlled and today it is 1-2/77. He is tolerating Lipitor well without myalgias and had last lipid profile checked in October 2017 which is satisfactory. He still gets occasional leg cramps and takes Zanaflex which seems to help him. He has not had carotid ultrasound study done in several years. He states he is trying to eat healthy and lose weight UPDATE 12/18/2017 Arthur Cisneros, 63 year old male returns for follow-up with history of stroke event 6-1/2 years ago.  He remains on Plavix further stroke or TIA symptoms.  He has minimal bruising and no bleeding.  He also has history of seizure disorder but no seizures in 6 years.  He remains on Keppra 250 mg twice daily.  Blood pressure in the office today 117/79.  He remains on Lipitor without myalgias.  He denies any falls.  He has AFO left lower leg.  He continues to take Zanaflex for cramping.  Carotid ultrasound after his last visit shows persistent chronic right ICA occlusion.  Presence of plaque on the left side but with improvement compared with previous study in May 2016.  Will repeat next year.  He was diagnosed with prostate cancer and had radiation.  He  has frequent accidents and wears depends.  He returns for reevaluation Update 01/08/2019 : He returns for follow-up after last visit a year ago.  He states he is doing well.  He has had no recurrent strokes now since August 2011 as well as no recurrent seizures since March 2012.  He remains on Plavix which is tolerating well without bruising or bleeding.  He states his  blood pressure is under good control and today it is 118/82.  He remains on Lipitor which is tolerating well without muscle aches and pains since states lipid profile checked at last primary care visit was satisfactory.  His sugars are also under good control though he cannot tell me the exact numbers.  He continues to use a cane to walk and states he is careful and has not had any major falls or injuries.  He has had no new interval health problems either.  Last carotid ultrasound was done on 01/12/2017 which had shown chronic right ICA occlusion and no significant left-sided stenosis. REVIEW OF SYSTEMS: Full 14 system review of systems performed and notable only for those listed, all others are neg:  Headache, restless leg, face rash, gait difficulty, hand weakness and all other systems negative  ALLERGIES: Allergies  Allergen Reactions  . Lyrica [Pregabalin]     hallucination    HOME MEDICATIONS: Outpatient Medications Prior to Visit  Medication Sig Dispense Refill  . acetaminophen (TYLENOL) 500 MG tablet Take 1,000 mg by mouth every 6 (six) hours as needed.    Marland Kitchen atorvastatin (LIPITOR) 40 MG tablet Take 40 mg by mouth daily.    . clopidogrel (PLAVIX) 75 MG tablet Take 1 tablet (75 mg total) by mouth daily. 30 tablet 11  . losartan (COZAAR) 25 MG tablet Take 25 mg by mouth daily.    . metFORMIN (GLUCOPHAGE) 500 MG tablet Take 500 mg by mouth 2 (two) times daily with a meal.    . nitroGLYCERIN (NITROSTAT) 0.4 MG SL tablet Place 0.4 mg under the tongue every 5 (five) minutes as needed.    . ranitidine (ZANTAC) 150 MG tablet Take 150 mg by mouth as needed for heartburn.    Marland Kitchen tiZANidine (ZANAFLEX) 2 MG tablet TAKE ONE TABLET AT BED-TIME WHEN NEEDED. 30 tablet 11  . levETIRAcetam (KEPPRA) 250 MG tablet TAKE 1 TABLET BY MOUTH TWICE DAILY 60 tablet 0  . levETIRAcetam (KEPPRA) 250 MG tablet     . metFORMIN (GLUCOPHAGE-XR) 500 MG 24 hr tablet     . venlafaxine (EFFEXOR) 25 MG tablet Take 12.5 mg by  mouth 2 (two) times daily with a meal.     No facility-administered medications prior to visit.     PAST MEDICAL HISTORY: Past Medical History:  Diagnosis Date  . Anxiety   . Coronary artery disease   . Diabetes mellitus    states no longer high blood sugars  . Hyperlipidemia   . Hypertension   . Prostate cancer ([place]) 05-2015   radiation CC in [place]  . Stroke [facility])     PAST SURGICAL HISTORY: Past Surgical History:  Procedure Laterality Date  . CORONARY ARTERY BYPASS GRAFT     4 graft  . PROSTATE BIOPSY      FAMILY HISTORY: Family History  Problem Relation Age of Onset  . Heart disease Mother   . Diabetes Mother   . Arthritis Mother   . Heart disease Father   . Diabetes Father   . Stroke Father   . Stroke Other   .  Gout Other   . Coronary artery disease Other     SOCIAL HISTORY: Social History   Socioeconomic History  . Marital status: Married    Spouse name: [name]  . Number of children: 5  . Years of education: 8th   . Highest education level: Not on file  Occupational History    Employer: RETIRED  Social Needs  . Financial resource strain: Not on file  . Food insecurity:    Worry: Not on file    Inability: Not on file  . Transportation needs:    Medical: Not on file    Non-medical: Not on file  Tobacco Use  . Smoking status: Former Smoker    Packs/day: 0.25    Years: 15.00    Pack years: 3.75    Types: Cigarettes    Start date: 10/19/1970    Last attempt to quit: 10/19/1985    Years since quitting: 33.2  . Smokeless tobacco: Current User    Types: Chew  Substance and Sexual Activity  . Alcohol use: No    Alcohol/week: 0.0 standard drinks  . Drug use: No  . Sexual activity: Not Currently  Lifestyle  . Physical activity:    Days per week: Not on file    Minutes per session: Not on file  . Stress: Not on file  Relationships  . Social connections:    Talks on phone: Not on file    Gets together: Not on file    Attends religious service:  Not on file    Active member of club or organization: Not on file    Attends meetings of clubs or organizations: Not on file    Relationship status: Not on file  . Intimate partner violence:    Fear of current or ex partner: Not on file    Emotionally abused: Not on file    Physically abused: Not on file    Forced sexual activity: Not on file  Other Topics Concern  . Not on file  Social History Narrative   Patient lives at home with wife [name].    Patient has 5 children.    Patient has a 8th grade.    Patient is retired.    Patient is on disability.    Patient is right handed.    Patient drinks caffeine every once in a while.      PHYSICAL EXAM  Vitals:   01/08/19 1135  BP: 118/81  Pulse: 63  Weight: 213 lb 9.6 oz (96.9 kg)  Height: _0  (1.753 m)   Body mass index is 31.54 kg/m.  Generalized: Well developed middle-aged Caucasian male in no acute distress  Head: normocephalic and atraumatic,.  Neck: Supple, no carotid bruits  Cardiac: Regular rate rhythm, no murmur  Musculoskeletal: AFO left leg  Neurological examination   Mentation: Alert oriented to time, place, history taking. Attention span and concentration appropriate. Recent and remote memory intact.  Follows all commands speech and language fluent.   Cranial nerve II-XII: Fundoscopic exam not done.Pupils were equal round reactive to light extraocular movements were full visual fields with dense left hemianopsia, mild left lower facial weakness, hearing was intact to finger rubbing bilaterally. Uvula tongue midline. head turning and shoulder shrug were normal and symmetric.Tongue protrusion into cheek strength was normal. Motor: Spastic left hemiplegia with 1 out of 5 upper extremity and 05 distal weakness non-fixed flexion contracture of the fingers of the left hand.  Left lower extremity is 4 out of 5 in 1  out of 5 ankle dorsiflexion and foot drop.   Sensory: Intact to all modalities on the right decreased on  the left   Coordination: finger-nose-finger, heel-to-shin bilaterally, normal on the right unable to perform on the left  Reflexes: 1+ in the right brisker on the left . Gait and Station: Spastic left hemiparetic gait circumduction left leg, drags his left foot uses a cane  DIAGNOSTIC DATA (LABS, IMAGING, TESTING) - I reviewed patient records, labs, notes, testing and imaging myself where available.  Lab Results  Component Value Date   WBC 7.3 01/18/2011   HGB 14.4 01/18/2011   HCT 42.1 01/18/2011   MCV 87.5 01/18/2011   PLT 208 01/18/2011      Component Value Date/Time   NA 140 01/19/2011 1318   K 4.1 01/19/2011 1318   CL 107 01/19/2011 1318   CO2 24 01/19/2011 1318   GLUCOSE 121 (H) 01/19/2011 1318   BUN 11 01/19/2011 1318   CREATININE 0.63 01/19/2011 1318   CALCIUM 9.0 01/19/2011 1318   PROT 6.2 01/19/2011 1318   ALBUMIN 3.8 01/19/2011 1318   AST 18 01/19/2011 1318   ALT 22 01/19/2011 1318   ALKPHOS 23 (L) 01/19/2011 1318   BILITOT 0.6 01/19/2011 1318   GFRNONAA >60 01/19/2011 1318   GFRAA  01/19/2011 1318    >60        The eGFR has been calculated using the MDRD equation. This calculation has not been validated in all clinical situations. eGFR's persistently <60 mL/min signify possible Chronic Kidney Disease.      ASSESSMENT AND PLAN 63 y.o. year old male has a past medical history of Hyperlipidemia; Hypertension; Stroke; Diabetes mellitus; Coronary artery disease; Anxiety; and Prostate cancer (05-2015). And seizure disorder in excellent control on Keppra. No further stroke or TIA symptoms.   PLAN:  I had a long discussion with the patient and his wife regarding his remote stroke, spastic hemiplegia and symptomatic seizures and answered questions.  Continue Plavix for stroke prevention with strict control of hypertension with blood pressure goal below 130/90, lipids with LDL cholesterol goal below 70 mg percent and diabetes with hemoglobin A1c goal below 6.5%.   He was encouraged to eat a healthy diet and to be active and to use his cane at all times and avoid falls and injuries.  Check screening carotid ultrasound and transcranial Doppler studies.  Continue Keppra and the current dose of 250 mg twice daily for seizure prevention and he was given a refill for a year.  He will return for follow-up in the future in 1 year with my nurse practitioner Janett Billow or call earlier if necessary I spent 25 min  in total face to face time with the patient more than 50% of which was spent counseling and coordination of care, reviewing test results reviewing medications and discussing and reviewing the diagnosis of stroke and management of risk factors, seizure disorder and spasticity and further treatment options. , Antony Contras, MD [facility] [address] [place], [place] [address] [phone]

## 2019-01-08 NOTE — Patient Instructions (Signed)
I had a long discussion with the patient and his wife regarding his remote stroke, spastic hemiplegia and symptomatic seizures and answered questions.  Continue Plavix for stroke prevention with strict control of hypertension with blood pressure goal below 130/90, lipids with LDL cholesterol goal below 70 mg percent and diabetes with hemoglobin A1c goal below 6.5%.  He was encouraged to eat a healthy diet and to be active and to use his cane at all times and avoid falls and injuries.  Check screening carotid ultrasound and transcranial Doppler studies.  Continue Keppra and the current dose of 250 mg twice daily for seizure prevention and he was given a refill for a year.  He will return for follow-up in the future in 1 year with my nurse practitioner Janett Billow or call earlier if necessary

## 2019-01-18 ENCOUNTER — Ambulatory Visit (HOSPITAL_COMMUNITY)
Admission: RE | Admit: 2019-01-18 | Discharge: 2019-01-18 | Disposition: A | Discharge status: Home / Self Care | Payer: Medicare Other | Source: Ambulatory Visit | Attending: Neurology | Admitting: Neurology

## 2019-01-18 ENCOUNTER — Ambulatory Visit (HOSPITAL_BASED_OUTPATIENT_CLINIC_OR_DEPARTMENT_OTHER)
Admission: RE | Admit: 2019-01-18 | Discharge: 2019-01-18 | Disposition: A | Discharge status: Home / Self Care | Payer: Medicare Other | Source: Ambulatory Visit | Attending: Neurology | Admitting: Neurology

## 2019-01-18 DIAGNOSIS — I6521 Occlusion and stenosis of right carotid artery: Secondary | ICD-10-CM

## 2019-01-18 NOTE — Progress Notes (Signed)
Bilateral carotid duplex completed. Premininary results in Chart review CV Proc. [facility], [place] 01/18/2019 4:03 PM

## 2019-01-18 NOTE — Progress Notes (Signed)
Transcranial Doppler completed. prelimnary results in Chart review CV Proc. [place] Mala Gibbard,R[facility] 3/6/202 4:11 PM

## 2019-01-28 ENCOUNTER — Telehealth: Payer: Self-pay

## 2019-01-28 NOTE — Telephone Encounter (Signed)
-----   Message from Garvin Fila, MD sent at 01/24/2019  5:42 PM EDT ----- Arthur Cisneros inform the patient that carotid ultrasound confirms previously known chronic occlusion of the right carotid artery and no significant blockages on the left side.  No new or worrisome finding

## 2019-01-28 NOTE — Telephone Encounter (Signed)
I called pt and wife. I stated the VAs carotid ultrasound confirms previously chronic occlusion of the right carotid artery and no significant blockages on the left side. No new or worrisome finding.The wife verbalized understanding. ------

## 2019-01-28 NOTE — Telephone Encounter (Signed)
I spoke with pt and his wife. I stated the TCD doppler study shows expected findings from right carotid occlusion in the neck with compensatory flow. No new or worrisome findings. The wife verbalized understanding. ------

## 2019-05-15 ENCOUNTER — Other Ambulatory Visit: Payer: Self-pay | Admitting: Urology

## 2019-05-31 ENCOUNTER — Encounter (HOSPITAL_COMMUNITY): Payer: Self-pay

## 2019-05-31 NOTE — Patient Instructions (Addendum)
YOU NEED TO HAVE A COVID 19 TEST ON__Monday 7/20_____ @__2 :00 pm_____,  THIS TEST MUST BE DONE BEFORE SURGERY, COME TO [facility] ENTRANCE.  ONCE YOUR COVID TEST IS COMPLETED, PLEASE BEGIN THE QUARANTINE INSTRUCTIONS AS OUTLINED IN YOUR HANDOUT.                Arthur Cisneros.   Your procedure is scheduled on: Thursday 06/06/19   Report to [facility] Main  Entrance Report to admitting at 1:15 PM    Call this number if you have problems the morning of surgery [phone]     Remember: Do not eat food  :After Midnight.you may have clear liquids until  0900 am then nothing by mouth    CLEAR LIQUID DIET   Foods Allowed                                                                     Foods Excluded  Coffee and tea, regular and decaf                             liquids that you cannot  Plain Jell-O any favor except red or purple                                           see through such as: Fruit ices (not with fruit pulp)                                     milk, soups, orange juice  Iced Popsicles                                    All solid food Carbonated beverages, regular and diet                                    Cranberry, grape and apple juices Sports drinks like Gatorade Lightly seasoned clear broth or consume(fat free) Sugar, honey syrup  Sample Menu Breakfast                                Lunch                                     Supper Cranberry juice                    Beef broth                            Chicken broth Jell-O  Grape juice                           Apple juice Coffee or tea                        Jell-O                                      Popsicle                                                Coffee or tea                        Coffee or tea  _____________________________________________________________________       BRUSH YOUR TEETH MORNING OF SURGERY AND RINSE  YOUR MOUTH OUT, NO CHEWING GUM CANDY OR MINTS.     Take these medicines the morning of surgery with A SIP OF WATER:    Keppra  DO NOT TAKE ANY DIABETIC MEDICATIONS DAY OF YOUR SURGERY        How to Manage Your Diabetes Before and After Surgery  Why is it important to control my blood sugar before and after surgery? . Improving blood sugar levels before and after surgery helps healing and can limit problems. . A way of improving blood sugar control is eating a healthy diet by: o  Eating less sugar and carbohydrates o  Increasing activity/exercise o  Talking with your doctor about reaching your blood sugar goals . High blood sugars (greater than 180 mg/dL) can raise your risk of infections and slow your recovery, so you will need to focus on controlling your diabetes during the weeks before surgery. . Make sure that the doctor who takes care of your diabetes knows about your planned surgery including the date and location.  How do I manage my blood sugar before surgery? . Check your blood sugar at least 4 times a day, starting 2 days before surgery, to make sure that the level is not too high or low. o Check your blood sugar the morning of your surgery when you wake up and every 2 hours until you get to the Short Stay unit. . If your blood sugar is less than 70 mg/dL, you will need to treat for low blood sugar: o Do not take insulin. o Treat a low blood sugar (less than 70 mg/dL) with  cup of clear juice (cranberry or apple), 4 glucose tablets, OR glucose gel. o Recheck blood sugar in 15 minutes after treatment (to make sure it is greater than 70 mg/dL). If your blood sugar is not greater than 70 mg/dL on recheck, call [phone] for further instructions. . Report your blood sugar to the short stay nurse when you get to Short Stay.  . If you are admitted to the hospital after surgery: o Your blood sugar will be checked by the staff and you will probably be given insulin after surgery  (instead of oral diabetes medicines) to make sure you have good blood sugar levels. o The goal for blood sugar control after surgery is 80-180 mg/dL.  You may not have any metal on your body including hair pins and              piercings  Do not wear jewelry,  lotions, powders or perfumes, deodorant                    Men may shave face and neck.   Do not bring valuables to the hospital. [place].  Contacts, dentures or bridgework may not be worn into surgery.  Leave suitcase in the car. After surgery it may be brought to your room.     Patients discharged the day of surgery will not be allowed to drive home. IF YOU ARE HAVING SURGERY AND GOING HOME THE SAME DAY, YOU MUST HAVE AN ADULT TO DRIVE YOU HOME AND BE WITH YOU FOR 24 HOURS. YOU MAY GO HOME BY TAXI OR UBER OR ORTHERWISE, BUT AN ADULT MUST ACCOMPANY YOU HOME AND STAY WITH YOU FOR 24 HOURS.  Name and phone number of your driver:  Special Instructions: N/A              Please read over the following fact sheets you were given: _____________________________________________________________________             [facility] - Preparing for Surgery Before surgery, you can play an important role.  Because skin is not sterile, your skin needs to be as free of germs as possible.  You can reduce the number of germs on your skin by washing with CHG (chlorahexidine gluconate) soap before surgery.  CHG is an antiseptic cleaner which kills germs and bonds with the skin to continue killing germs even after washing. Please DO NOT use if you have an allergy to CHG or antibacterial soaps.  If your skin becomes reddened/irritated stop using the CHG and inform your nurse when you arrive at Short Stay. Do not shave (including legs and underarms) for at least 48 hours prior to the first CHG shower.  You may shave your face/neck. Please follow these instructions carefully:  1.  Shower with  CHG Soap the night before surgery and the  morning of Surgery.  2.  If you choose to wash your hair, wash your hair first as usual with your  normal  shampoo.  3.  After you shampoo, rinse your hair and body thoroughly to remove the  shampoo.                           4.  Use CHG as you would any other liquid soap.  You can apply chg directly  to the skin and wash                       Gently with a scrungie or clean washcloth.  5.  Apply the CHG Soap to your body ONLY FROM THE NECK DOWN.   Do not use on face/ open                           Wound or open sores. Avoid contact with eyes, ears mouth and genitals (private parts).                       Wash face,  Genitals (private parts) with your normal soap.  6.  Wash thoroughly, paying special attention to the area where your surgery  will be performed.  7.  Thoroughly rinse your body with warm water from the neck down.  8.  DO NOT shower/wash with your normal soap after using and rinsing off  the CHG Soap.                9.  Pat yourself dry with a clean towel.            10.  Wear clean pajamas.            11.  Place clean sheets on your bed the night of your first shower and do not  sleep with pets. Day of Surgery : Do not apply any lotions/deodorants the morning of surgery.  Please wear clean clothes to the hospital/surgery center.  FAILURE TO FOLLOW THESE INSTRUCTIONS MAY RESULT IN THE CANCELLATION OF YOUR SURGERY PATIENT SIGNATURE_________________________________  NURSE SIGNATURE__________________________________  ________________________________________________________________________

## 2019-06-03 ENCOUNTER — Other Ambulatory Visit (HOSPITAL_COMMUNITY)
Admission: RE | Admit: 2019-06-03 | Discharge: 2019-06-03 | Disposition: A | Discharge status: Home / Self Care | Payer: Medicare Other | Source: Ambulatory Visit | Attending: Urology | Admitting: Urology

## 2019-06-03 ENCOUNTER — Encounter (HOSPITAL_COMMUNITY)
Admission: RE | Admit: 2019-06-03 | Discharge: 2019-06-03 | Disposition: A | Discharge status: Home / Self Care | Payer: Medicare Other | Source: Ambulatory Visit | Attending: Urology | Admitting: Urology

## 2019-06-03 ENCOUNTER — Encounter (HOSPITAL_COMMUNITY): Payer: Self-pay

## 2019-06-03 ENCOUNTER — Other Ambulatory Visit: Payer: Self-pay

## 2019-06-03 DIAGNOSIS — I252 Old myocardial infarction: Secondary | ICD-10-CM | POA: Diagnosis not present

## 2019-06-03 DIAGNOSIS — Z79899 Other long term (current) drug therapy: Secondary | ICD-10-CM | POA: Diagnosis not present

## 2019-06-03 DIAGNOSIS — N200 Calculus of kidney: Secondary | ICD-10-CM | POA: Diagnosis present

## 2019-06-03 DIAGNOSIS — Z791 Long term (current) use of non-steroidal anti-inflammatories (NSAID): Secondary | ICD-10-CM | POA: Diagnosis not present

## 2019-06-03 DIAGNOSIS — E7800 Pure hypercholesterolemia, unspecified: Secondary | ICD-10-CM | POA: Diagnosis not present

## 2019-06-03 DIAGNOSIS — Z8673 Personal history of transient ischemic attack (TIA), and cerebral infarction without residual deficits: Secondary | ICD-10-CM | POA: Diagnosis not present

## 2019-06-03 DIAGNOSIS — Z951 Presence of aortocoronary bypass graft: Secondary | ICD-10-CM | POA: Diagnosis not present

## 2019-06-03 DIAGNOSIS — Z923 Personal history of irradiation: Secondary | ICD-10-CM | POA: Diagnosis not present

## 2019-06-03 DIAGNOSIS — C61 Malignant neoplasm of prostate: Secondary | ICD-10-CM | POA: Diagnosis not present

## 2019-06-03 DIAGNOSIS — I10 Essential (primary) hypertension: Secondary | ICD-10-CM | POA: Diagnosis not present

## 2019-06-03 DIAGNOSIS — Z1159 Encounter for screening for other viral diseases: Secondary | ICD-10-CM | POA: Insufficient documentation

## 2019-06-03 DIAGNOSIS — Z01818 Encounter for other preprocedural examination: Secondary | ICD-10-CM | POA: Insufficient documentation

## 2019-06-03 DIAGNOSIS — I2510 Atherosclerotic heart disease of native coronary artery without angina pectoris: Secondary | ICD-10-CM | POA: Diagnosis not present

## 2019-06-03 DIAGNOSIS — Z7902 Long term (current) use of antithrombotics/antiplatelets: Secondary | ICD-10-CM | POA: Diagnosis not present

## 2019-06-03 DIAGNOSIS — G40909 Epilepsy, unspecified, not intractable, without status epilepticus: Secondary | ICD-10-CM | POA: Diagnosis not present

## 2019-06-03 DIAGNOSIS — E119 Type 2 diabetes mellitus without complications: Secondary | ICD-10-CM | POA: Insufficient documentation

## 2019-06-03 HISTORY — DX: Type 2 diabetes mellitus without complications: E11.9

## 2019-06-03 HISTORY — DX: Personal history of urinary calculi: Z87.442

## 2019-06-03 LAB — CBC
HCT: 48.4 % (ref 39.0–52.0)
Hemoglobin: 15.4 g/dL (ref 13.0–17.0)
MCH: 30 pg (ref 26.0–34.0)
MCHC: 31.8 g/dL (ref 30.0–36.0)
MCV: 94.3 fL (ref 80.0–100.0)
Platelets: 250 10*3/uL (ref 150–400)
RBC: 5.13 MIL/uL (ref 4.22–5.81)
RDW: 12.9 % (ref 11.5–15.5)
WBC: 8.1 10*3/uL (ref 4.0–10.5)
nRBC: 0 % (ref 0.0–0.2)

## 2019-06-03 LAB — BASIC METABOLIC PANEL
Anion gap: 10 (ref 5–15)
BUN: 16 mg/dL (ref 8–23)
CO2: 25 mmol/L (ref 22–32)
Calcium: 9.1 mg/dL (ref 8.9–10.3)
Chloride: 104 mmol/L (ref 98–111)
Creatinine, Ser: 0.86 mg/dL (ref 0.61–1.24)
GFR calc Af Amer: >60 mL/min (ref >60)
GFR calc non Af Amer: >60 mL/min (ref >60)
Glucose, Bld: 102 mg/dL — ABNORMAL HIGH (ref 70–99)
Potassium: 4 mmol/L (ref 3.5–5.1)
Sodium: 139 mmol/L (ref 135–145)

## 2019-06-03 LAB — HEMOGLOBIN A1C
Hgb A1c MFr Bld: 6 % — ABNORMAL HIGH (ref 4.8–5.6)
Mean Plasma Glucose: 125.5 mg/dL

## 2019-06-03 LAB — SARS CORONAVIRUS 2 (TAT 6-24 HRS): SARS Coronavirus 2: NEGATIVE

## 2019-06-03 LAB — GLUCOSE, CAPILLARY: Glucose-Capillary: 104 mg/dL — ABNORMAL HIGH (ref 70–99)

## 2019-06-05 NOTE — Anesthesia Preprocedure Evaluation (Addendum)
Anesthesia Evaluation  Patient identified by MRN, date of birth, ID band Patient awake    Reviewed: Allergy & Precautions, NPO status , Patient's Chart, lab work & pertinent test results  Airway Mallampati: II  TM Distance: >3 FB Neck ROM: Full    Dental no notable dental hx.    Pulmonary neg pulmonary ROS, former smoker,    Pulmonary exam normal breath sounds clear to auscultation       Cardiovascular hypertension, Pt. on medications + CAD, + Past MI and + CABG  Normal cardiovascular exam Rhythm:Regular Rate:Normal     Neuro/Psych  Headaches, Seizures -,  Anxiety CVA, Residual Symptoms negative psych ROS   GI/Hepatic negative GI ROS, Neg liver ROS,   Endo/Other  negative endocrine ROSdiabetes, Type 2  Renal/GU negative Renal ROS  negative genitourinary   Musculoskeletal  (+) Arthritis , Osteoarthritis,    Abdominal (+) + obese,   Peds negative pediatric ROS (+)  Hematology negative hematology ROS (+)   Anesthesia Other Findings   Reproductive/Obstetrics negative OB ROS                            Anesthesia Physical Anesthesia Plan  ASA: III  Anesthesia Plan: General   Post-op Pain Management:    Induction: Intravenous  PONV Risk Score and Plan: 2 and Ondansetron, Midazolam and Treatment may vary due to age or medical condition  Airway Management Planned: LMA  Additional Equipment:   Intra-op Plan:   Post-operative Plan: Extubation in OR  Informed Consent: I have reviewed the patients History and Physical, chart, labs and discussed the procedure including the risks, benefits and alternatives for the proposed anesthesia with the patient or authorized representative who has indicated his/her understanding and acceptance.     Dental advisory given  Plan Discussed with: CRNA  Anesthesia Plan Comments: (See PAT note 06/03/2019, Konrad Felix, PA-C)       Anesthesia  Quick Evaluation

## 2019-06-05 NOTE — H&P (Signed)
Office Visit Report     05/10/2019   --------------------------------------------------------------------------------   Arthur Cisneros  MRN: [id number]  DOB: [birth year], 63 year old Male  SSN: [id number]   PRIMARY CARE:  Petra Kuba, MD  REFERRING:  Raynelle Bring, Eduardo Osier  PROVIDER:  Raynelle Bring, M.D.  LOCATION:  [facility] [phone]     --------------------------------------------------------------------------------   CC/HPI: 1. Locally advanced prostate cancer  2. Microscopic hematuria   Arthur Cisneros returns today for follow-up and surveillance of his locally advanced prostate cancer. He was treated with radiation therapy and androgen deprivation. He has been off androgen deprivation therapy for well over 1 year. He denies any gross hematuria, dysuria, suprapubic pain, urgency/frequency, or flank pain except for some mild right-sided flank pain that began approximately 2-3 days ago. He does have a history of kidney stones. He denies tobacco use. He is voiding relatively well without changes from his last visit.     ALLERGIES: No Allergies    MEDICATIONS: Sildenafil Citrate 100 mg tablet 1 tablet PO prn as directed  Levetiracetam 250 mg tablet Oral  Lipitor 40 mg tablet Oral  Losartan Potassium 25 mg tablet Oral  Nitrostat 0.4 mg tablet, sublingual Sublingual  Plavix 75 mg tablet Oral  Voltaren 75 mg tablet, delayed release Oral     GU PSH: None     PSH Notes: Coronary Artery Four Or More Arterial Bypass Grafts   NON-GU PSH: Cabg; Arterial; Four Or More - 2016     GU PMH: 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 Clyde Lundborg and Dr. Tammi Klippel. He saw me initially in consultation in September 2016 and  re-established care with me in March 2018 to complete his care after Dr. Exie Parody left his practice.   His past medical history is significant for a prior CVA managed with Plavix and CAD s/p CABG. He does take daily nitrate medication. He does not regularly follow with a cardiologist. He does have resultant severe deficits in his left upper and left lower extremity. He is ambulatory with a walker.   TNM stage: cT3a N0 M0  PSA: 25.7  Gleason score: 4+4=8  Biopsy (05/25/15 - read by Dr. Cyndie Chime, Acc # [id number]): 10/12 cores positive  Left: L lateral apex (62%, 3+3=6, PNI), L apex (53%, 3+3=6, PNI), L lateral mid (60%, 3+3=6, PNI), L mid (100%, 3+3=6)  Right: R apex (93%, 4+4=8), R lateral apex (67%, 4+4=8, PNI), R mid (50%, 4+4=8), R lateral mid (59%, 4+4=8, PNI), R base (85%, 4+4=8, PNI), R lateral base (92%, 4+4=8, PNI)  Prostate volume: 80 cc       NON-GU PMH: Anxiety, Anxiety - 2016 Arthritis Depression Hypercholesterolemia Hypertension Seizure disorder Stroke/TIA    FAMILY HISTORY: Myocardial Infarction - Runs In Family Strokes - Runs In Family   SOCIAL HISTORY: Marital Status: Married Preferred Language: English; Ethnicity: Not Hispanic Or Latino; Race: White Current Smoking Status: Patient has never smoked.   Tobacco Use Assessment Completed: Used Tobacco in last 30 days? Does not drink anymore.  Drinks 1 caffeinated drink per day.     Notes: Caffeine use, Never a smoker, Alcohol use   REVIEW OF SYSTEMS:    GU Review Male:  Patient reports hard to postpone urination and get up at night to urinate. Patient denies frequent urination, burning/ pain with urination, leakage of urine, stream starts and stops, trouble starting your streams, and have to strain to urinate .  Gastrointestinal (Upper):   Patient denies nausea and vomiting.  Gastrointestinal (Lower):   Patient denies diarrhea and constipation.  Constitutional:   Patient denies fever, night sweats,  weight loss, and fatigue.  Skin:   Patient denies skin rash/ lesion and itching.  Eyes:   Patient denies blurred vision and double vision.  Ears/ Nose/ Throat:   Patient denies sore throat and sinus problems.  Hematologic/Lymphatic:   Patient denies swollen glands and easy bruising.  Cardiovascular:   Patient denies leg swelling and chest pains.  Respiratory:   Patient denies cough and shortness of breath.  Endocrine:   Patient denies excessive thirst.  Musculoskeletal:   Patient reports joint pain. Patient denies back pain.  Neurological:   Patient reports headaches. Patient denies dizziness.  Psychologic:   Patient denies depression and anxiety.   VITAL SIGNS:      05/10/2019 09:41 AM  Weight 214 lb / 97.07 kg  Height 69 in / 175.26 cm  BP 156/93 mmHg  Pulse 67 /min  Temperature 97.9 F / 36.6 C  BMI 31.6 kg/m   GU PHYSICAL EXAMINATION:    Prostate: Smooth and flat.   MULTI-SYSTEM PHYSICAL EXAMINATION:    Constitutional: Well-nourished. No physical deformities. Normally developed. Good grooming.  Gastrointestinal: No mass, no tenderness, no rigidity, non obese abdomen. He does have moderate right CVA tenderness on exam.     PAST DATA REVIEWED:  Source Of History:  Patient  X-Ray Review: C.T. Abdomen/Pelvis: Reviewed Films.     10/19/18 02/02/18 08/02/17 01/25/17  PSA  Total PSA 0.10 ng/mL 0.050 ng/mL <0.015 ng/dL < 0.015 ng/dl    02/02/18 08/02/17  Hormones  Testosterone, Total 276.1 ng/dL <10 ng/dL   Notes:                     I independently reviewed his CT scan. He does have a large renal pelvic stone measuring approximately 1.5 cm with adjacent smaller stones. He does not have significant hydronephrosis. Stone is only faintly radiopaque on his scout images. Hounsfield units measure approximately 522.    PROCEDURES:         C.T. Urogram - [id number]      Patient confirmed No Neulasta OnPro Device.           Urinalysis w/Scope - 81001 Dipstick Dipstick Cont'd Micro   Color: Red Bilirubin: Invalid WBC/hpf: 0 - 5/hpf  Appearance: Turbid Ketones: Invalid RBC/hpf: >60/hpf  Specific Gravity: Invalid Blood: Invalid Bacteria: Mod (26-50/hpf)  pH: Invalid Protein: Invalid Cystals: NS (Not Seen)  Glucose: Invalid Urobilinogen: Invalid Casts: NS (Not Seen)    Nitrites: Invalid Trichomonas: Not Present    Leukocyte Esterase: Invalid Mucous: Not Present      Epithelial Cells: 0 - 5/hpf      Yeast: NS (Not Seen)      Sperm: Not Present    Notes:  microscopic not concetrated invalid due to color and clarity     ASSESSMENT:      ICD-10 Details  1 GU:   Microscopic hematuria - R31.21   2   Flank Pain - R10.84   3   Prostate Cancer - C61   4   Renal calculus - N20.0    PLAN:  Medications New Meds: Cephalexin 500 mg capsule 1 capsule PO TID   #21  0 Refill(s)  Hydrocodone-Acetaminophen 5 mg-325 mg tablet 1-2 tablet PO Q 6 H prn   #20  0 Refill(s)            Orders Labs PSA, Urine Culture, BMP  X-Rays: C.T. Stone Protocol Without Contrast  X-Ray Notes: History:  Hematuria: Yes/No  Patient to see MD after exam: Yes/No  Previous exam: CT / IVP/ US/ KUB/ None  When:  Where:  Diabetic: Yes/ No  BUN/ Creatinine:  Date of last BUN Creatinine:  Weight in pounds:  Allergy- IV Contrast: Yes/ No  Conflicting diabetic meds: Yes/ No  Diabetic Meds:  Prior Authorization #: NA CASE [id number]           Schedule Return Visit/Planned Activity: 6 Months - Office Visit  Return Visit/Planned Activity: Other See Visit Notes             Note: He should call me on Monday with his decision about how he wishes to treat his kidney stone.          Document Letter(s):  Created for Patient: Clinical Summary         Notes:   1. Locally advanced prostate cancer: His PSA will be checked today. If no recurrence, follow-up in 6 months for continued surveillance.   2. Microscopic hematuria/right renal calculus: His urine has been cultured  today. The most likely source of his significant hematuria is likely related to his stone noted on CT imaging today. He is on anti-platelet therapy with Plavix due to a history of a stroke. He does not have a cardiac stent. We did review options for treatment of his stone. I did not recommend continued observation. He is not interested in percutaneous nephrolithotomy due to the invasiveness of the procedure. We therefore focused our discussion on extracorporeal shockwave lithotripsy which may place him at risk for future procedures considering the very large stone. We also discussed ureteroscopy with laser lithotripsy possibly as a staged procedure as well. We reviewed the pros and cons of each of these approaches. He is going to consider his options over the weekend and will call me early next week after discussing this further with his wife as to how he would like to proceed. He will be empirically treated with cephalexin pending his urine culture and will call should he develop fever. Currently, he does not have significant hydronephrosis and his pain is relatively minor. He will have a prescription for pain medication. Currently, his procedure can be performed electively.   Cc: Dr. Sena Hitch        Next Appointment:      Next Appointment: 11/20/2019 09:45 AM    Appointment Type: Office Visit Established Patient    Location: [facility] [phone]    Provider: Raynelle Bring, M.D.    Reason for Visit: 6 month follow up      * Signed by Raynelle Bring, M.D. on 05/13/19 at 5:44 PM (EDT)*       APPENDED NOTES:  Arthur Cisneros call back in elect to proceed with ureteroscopic laser lithotripsy for treatment of his large right renal calculus. Potential risks, complications, and expected recovery process have been discussed. He may potentially need a staged procedure and will require a postoperative ureteral stent. This will be scheduled for later this month.     * Signed by  Raynelle Bring, M.D. on 05/13/19 at 7:53 PM (EDT)*

## 2019-06-05 NOTE — Progress Notes (Signed)
Anesthesia Chart Review   Case: [id number] Date/Time: 06/06/19 1500   Procedure: CYSTOSCOPY/URETEROSCOPY/HOLMIUM LASER/STENT PLACEMENT (Right )   Anesthesia type: General   Pre-op diagnosis: RIGHT RENAL PELVIS CALCULUS   Location: [place] [facility]   Surgeon: Raynelle Bring, MD      DISCUSSION:62 y.o. former smoker (3.75 pack years, quit 10/19/85) with h/o HLD, CAD (CABG 2000), Sroke 2011 (left sided weakness, drop foot, uses single foot cane, on Plavix), isolated seizure 2011 (remains on Keppra), HTN, DM II, prostate cancer s/p radiation, right renal pelvis calculus scheduled for above procedure 06/06/2019 with Dr. Raynelle Bring.   Nuclear myocardial perfusion study 10/02/15 demonstrated prior myocardial infarction in the inferior and inferolateral walls with no evidence of ischemia. It was deemed a low risk study.  Last seen by Cardiologist, Dr. Bronson Ing, 04/16/2018.  Stable at this visit with no medication changes made.  1 year follow up recommended, scheduled 08/09/2019.  Anticipate pt can proceed with planned procedure barring acute status change.   VS: BP 135/85   Pulse 72   Temp 37.1 C (Oral)   Resp 18   Ht 5\' 9"  (1.753 m)   Wt 94.9 kg   SpO2 98%   BMI 30.91 kg/m   PROVIDERS: Petra Kuba, MD is PCP   Antony Contras, MD is Neurologist last seen 01/08/2019 with 1 year follow up recommended  Kate Sable, MD is Cardiologist  LABS: Labs reviewed: Acceptable for surgery. (all labs ordered are listed, but only abnormal results are displayed)  Labs Reviewed  BASIC METABOLIC PANEL - Abnormal; Notable for the following components:      Result Value   Glucose, Bld 102 (*)    All other components within normal limits  GLUCOSE, CAPILLARY - Abnormal; Notable for the following components:   Glucose-Capillary 104 (*)    All other components within normal limits  HEMOGLOBIN A1C - Abnormal; Notable for the following components:   Hgb A1c MFr Bld 6.0 (*)    All  other components within normal limits  CBC     IMAGES: VAS [place] 01/18/2019 Summary: Right Carotid: Evidence consistent with a total occlusion of the right ICA.  Left Carotid: Velocities in the left ICA are consistent with a 1-39% stenosis.  Vertebrals:  Bilateral vertebral arteries demonstrate antegrade flow. Subclavians: Normal flow hemodynamics were seen in bilateral subclavian              arteries.  EKG: 06/03/2019 Rate 65 bpm Normal sinus rhythm   CV: Echo 09/30/15 Study Conclusions  - Left ventricle: The cavity size was normal. Wall thickness was   increased in a pattern of mild LVH. Systolic function was normal.   The estimated ejection fraction was in the range of 55% to 60%.   Images were inadequate for LV wall motion assessment. Doppler   parameters are consistent with abnormal left ventricular   relaxation (grade 1 diastolic dysfunction). - Aortic valve: Mildly calcified annulus. Trileaflet. - Aorta: Mild aortic root dilatation. Aortic root dimension: 41 mm   (ED). - Mitral valve: Calcified annulus. Normal thickness leaflets . - Right ventricle: Systolic function was mildly reduced. Past Medical History:  Diagnosis Date  . Anxiety    pt denies  . Coronary artery disease   . Diabetes mellitus without complication ([place])    type 2  . History of kidney stones   . Hyperlipidemia   . Hypertension   . Prostate cancer ([place]) 05-2015   radiation CC in [place]  . Stroke (  [place])    left side weakness with drop foot    Past Surgical History:  Procedure Laterality Date  . CORONARY ARTERY BYPASS GRAFT     4 graft  . PROSTATE BIOPSY    . right hand surgery      MEDICATIONS: . acetaminophen (TYLENOL) 500 MG tablet  . atorvastatin (LIPITOR) 40 MG tablet  . clopidogrel (PLAVIX) 75 MG tablet  . levETIRAcetam (KEPPRA) 250 MG tablet  . losartan (COZAAR) 25 MG tablet  . metFORMIN (GLUCOPHAGE-XR) 500 MG 24 hr tablet  . nitroGLYCERIN (NITROSTAT) 0.4 MG SL tablet  .  tiZANidine (ZANAFLEX) 2 MG tablet   No current facility-administered medications for this encounter.      Maia [facility] Pre-Surgical Testing [phone] 06/05/19  10:14 AM

## 2019-06-06 ENCOUNTER — Encounter (HOSPITAL_COMMUNITY): Payer: Self-pay | Admitting: General Practice

## 2019-06-06 ENCOUNTER — Ambulatory Visit (HOSPITAL_COMMUNITY): Payer: Medicare Other | Admitting: Anesthesiology

## 2019-06-06 ENCOUNTER — Ambulatory Visit (HOSPITAL_COMMUNITY)
Admission: RE | Admit: 2019-06-06 | Discharge: 2019-06-06 | Disposition: A | Discharge status: Home / Self Care | Payer: Medicare Other | Attending: Urology | Admitting: Urology

## 2019-06-06 ENCOUNTER — Encounter (HOSPITAL_COMMUNITY)
Admission: RE | Disposition: A | Discharge status: Home / Self Care | Payer: Self-pay | Source: Home / Self Care | Attending: Urology

## 2019-06-06 ENCOUNTER — Ambulatory Visit (HOSPITAL_COMMUNITY): Payer: Medicare Other | Admitting: Physician Assistant

## 2019-06-06 ENCOUNTER — Ambulatory Visit (HOSPITAL_COMMUNITY): Payer: Medicare Other

## 2019-06-06 DIAGNOSIS — N200 Calculus of kidney: Secondary | ICD-10-CM | POA: Diagnosis not present

## 2019-06-06 DIAGNOSIS — Z1159 Encounter for screening for other viral diseases: Secondary | ICD-10-CM | POA: Insufficient documentation

## 2019-06-06 DIAGNOSIS — Z791 Long term (current) use of non-steroidal anti-inflammatories (NSAID): Secondary | ICD-10-CM | POA: Insufficient documentation

## 2019-06-06 DIAGNOSIS — Z923 Personal history of irradiation: Secondary | ICD-10-CM | POA: Insufficient documentation

## 2019-06-06 DIAGNOSIS — I252 Old myocardial infarction: Secondary | ICD-10-CM | POA: Insufficient documentation

## 2019-06-06 DIAGNOSIS — I2510 Atherosclerotic heart disease of native coronary artery without angina pectoris: Secondary | ICD-10-CM | POA: Insufficient documentation

## 2019-06-06 DIAGNOSIS — Z7902 Long term (current) use of antithrombotics/antiplatelets: Secondary | ICD-10-CM | POA: Insufficient documentation

## 2019-06-06 DIAGNOSIS — C61 Malignant neoplasm of prostate: Secondary | ICD-10-CM | POA: Diagnosis not present

## 2019-06-06 DIAGNOSIS — I10 Essential (primary) hypertension: Secondary | ICD-10-CM | POA: Insufficient documentation

## 2019-06-06 DIAGNOSIS — Z8673 Personal history of transient ischemic attack (TIA), and cerebral infarction without residual deficits: Secondary | ICD-10-CM | POA: Insufficient documentation

## 2019-06-06 DIAGNOSIS — Z951 Presence of aortocoronary bypass graft: Secondary | ICD-10-CM | POA: Insufficient documentation

## 2019-06-06 DIAGNOSIS — E7800 Pure hypercholesterolemia, unspecified: Secondary | ICD-10-CM | POA: Insufficient documentation

## 2019-06-06 DIAGNOSIS — G40909 Epilepsy, unspecified, not intractable, without status epilepticus: Secondary | ICD-10-CM | POA: Insufficient documentation

## 2019-06-06 DIAGNOSIS — Z79899 Other long term (current) drug therapy: Secondary | ICD-10-CM | POA: Insufficient documentation

## 2019-06-06 HISTORY — PX: CYSTOSCOPY/URETEROSCOPY/HOLMIUM LASER/STENT PLACEMENT: SHX6546

## 2019-06-06 LAB — GLUCOSE, CAPILLARY
Glucose-Capillary: 103 mg/dL — ABNORMAL HIGH (ref 70–99)
Glucose-Capillary: 127 mg/dL — ABNORMAL HIGH (ref 70–99)

## 2019-06-06 SURGERY — CYSTOSCOPY/URETEROSCOPY/HOLMIUM LASER/STENT PLACEMENT
Anesthesia: General | Laterality: Right

## 2019-06-06 MED ORDER — MIDAZOLAM HCL 2 MG/2ML IJ SOLN
INTRAMUSCULAR | Status: AC
  Filled 2019-06-06: qty 2

## 2019-06-06 MED ORDER — FENTANYL CITRATE (PF) 100 MCG/2ML IJ SOLN
INTRAMUSCULAR | Status: DC | PRN
  Administered 2019-06-06: 50 ug via INTRAVENOUS
  Administered 2019-06-06 (×3): 25 ug via INTRAVENOUS
  Administered 2019-06-06: 50 ug via INTRAVENOUS
  Administered 2019-06-06: 25 ug via INTRAVENOUS

## 2019-06-06 MED ORDER — HYDROMORPHONE HCL 1 MG/ML IJ SOLN
0.2500 mg | INTRAMUSCULAR | Status: DC | PRN
  Administered 2019-06-06 (×2): 0.5 mg via INTRAVENOUS

## 2019-06-06 MED ORDER — EPHEDRINE SULFATE-NACL 50-0.9 MG/10ML-% IV SOSY
PREFILLED_SYRINGE | INTRAVENOUS | Status: DC | PRN
  Administered 2019-06-06: 10 mg via INTRAVENOUS

## 2019-06-06 MED ORDER — 0.9 % SODIUM CHLORIDE (POUR BTL) OPTIME
TOPICAL | Status: DC | PRN
  Administered 2019-06-06: 1000 mL

## 2019-06-06 MED ORDER — HYDRALAZINE HCL 20 MG/ML IJ SOLN
5.0000 mg | INTRAMUSCULAR | Status: AC | PRN
  Administered 2019-06-06 (×2): 5 mg via INTRAVENOUS

## 2019-06-06 MED ORDER — LIDOCAINE 2% (20 MG/ML) 5 ML SYRINGE
INTRAMUSCULAR | Status: DC | PRN
  Administered 2019-06-06: 100 mg via INTRAVENOUS

## 2019-06-06 MED ORDER — MIDAZOLAM HCL 5 MG/5ML IJ SOLN
INTRAMUSCULAR | Status: DC | PRN
  Administered 2019-06-06: 0.5 mg via INTRAVENOUS

## 2019-06-06 MED ORDER — SODIUM CHLORIDE 0.9 % IR SOLN
Status: DC | PRN
  Administered 2019-06-06: 6000 mL

## 2019-06-06 MED ORDER — EPHEDRINE 5 MG/ML INJ
INTRAVENOUS | Status: AC
  Filled 2019-06-06: qty 10

## 2019-06-06 MED ORDER — FENTANYL CITRATE (PF) 100 MCG/2ML IJ SOLN
INTRAMUSCULAR | Status: AC
  Filled 2019-06-06: qty 2

## 2019-06-06 MED ORDER — PROPOFOL 10 MG/ML IV BOLUS
INTRAVENOUS | Status: DC | PRN
  Administered 2019-06-06: 160 mg via INTRAVENOUS

## 2019-06-06 MED ORDER — HYDROMORPHONE HCL 1 MG/ML IJ SOLN
INTRAMUSCULAR | Status: AC
  Administered 2019-06-06: 18:00:00 0.5 mg via INTRAVENOUS
  Filled 2019-06-06: qty 1

## 2019-06-06 MED ORDER — HYDROCODONE-ACETAMINOPHEN 5-325 MG PO TABS: 1.0000 | ORAL_TABLET | Freq: Four times a day (QID) | ORAL | 0 refills | Status: DC | PRN

## 2019-06-06 MED ORDER — PHENYLEPHRINE 40 MCG/ML (10ML) SYRINGE FOR IV PUSH (FOR BLOOD PRESSURE SUPPORT)
PREFILLED_SYRINGE | INTRAVENOUS | Status: DC | PRN
  Administered 2019-06-06: 80 ug via INTRAVENOUS

## 2019-06-06 MED ORDER — MEPERIDINE HCL 50 MG/ML IJ SOLN: 6.2500 mg | INTRAMUSCULAR | Status: DC | PRN

## 2019-06-06 MED ORDER — IOHEXOL 300 MG/ML  SOLN
INTRAMUSCULAR | Status: DC | PRN
  Administered 2019-06-06: 5 mL

## 2019-06-06 MED ORDER — OXYCODONE HCL 5 MG PO TABS: 5.0000 mg | ORAL_TABLET | Freq: Once | ORAL | Status: DC | PRN

## 2019-06-06 MED ORDER — PROMETHAZINE HCL 25 MG/ML IJ SOLN
6.2500 mg | INTRAMUSCULAR | Status: DC | PRN
  Administered 2019-06-06: 17:00:00 12.5 mg via INTRAVENOUS

## 2019-06-06 MED ORDER — OXYCODONE HCL 5 MG/5ML PO SOLN: 5.0000 mg | Freq: Once | ORAL | Status: DC | PRN

## 2019-06-06 MED ORDER — HYDRALAZINE HCL 20 MG/ML IJ SOLN
INTRAMUSCULAR | Status: AC
  Administered 2019-06-06: 18:00:00 5 mg via INTRAVENOUS
  Filled 2019-06-06: qty 1

## 2019-06-06 MED ORDER — PHENYLEPHRINE 40 MCG/ML (10ML) SYRINGE FOR IV PUSH (FOR BLOOD PRESSURE SUPPORT)
PREFILLED_SYRINGE | INTRAVENOUS | Status: AC
  Filled 2019-06-06: qty 10

## 2019-06-06 MED ORDER — DEXAMETHASONE SODIUM PHOSPHATE 10 MG/ML IJ SOLN
INTRAMUSCULAR | Status: DC | PRN
  Administered 2019-06-06: 5 mg via INTRAVENOUS

## 2019-06-06 MED ORDER — PROMETHAZINE HCL 25 MG/ML IJ SOLN
INTRAMUSCULAR | Status: AC
  Administered 2019-06-06: 12.5 mg via INTRAVENOUS
  Filled 2019-06-06: qty 1

## 2019-06-06 MED ORDER — LACTATED RINGERS IV SOLN
INTRAVENOUS | Status: DC
  Administered 2019-06-06: 14:00:00 via INTRAVENOUS

## 2019-06-06 MED ORDER — ONDANSETRON HCL 4 MG/2ML IJ SOLN
INTRAMUSCULAR | Status: DC | PRN
  Administered 2019-06-06: 4 mg via INTRAVENOUS

## 2019-06-06 MED ORDER — CEFAZOLIN SODIUM-DEXTROSE 2-4 GM/100ML-% IV SOLN
2.0000 g | Freq: Once | INTRAVENOUS | Status: AC
  Administered 2019-06-06: 2 g via INTRAVENOUS
  Filled 2019-06-06: qty 100

## 2019-06-06 SURGICAL SUPPLY — 20 items
BAG URO CATCHER STRL LF (MISCELLANEOUS) ×3 IMPLANT
BASKET ZERO TIP NITINOL 2.4FR (BASKET) ×3 IMPLANT
CATH INTERMIT  6FR 70CM (CATHETERS) ×3 IMPLANT
CLOTH BEACON ORANGE TIMEOUT ST (SAFETY) ×3 IMPLANT
COVER WAND RF STERILE (DRAPES) IMPLANT
FIBER LASER FLEXIVA 365 (UROLOGICAL SUPPLIES) IMPLANT
FIBER LASER TRAC TIP (UROLOGICAL SUPPLIES) ×3 IMPLANT
GLOVE BIOGEL M STRL SZ7.5 (GLOVE) ×3 IMPLANT
GOWN STRL REUS W/TWL LRG LVL3 (GOWN DISPOSABLE) ×3 IMPLANT
GOWN STRL REUS W/TWL XL LVL3 (GOWN DISPOSABLE) ×3 IMPLANT
GUIDEWIRE ANG ZIPWIRE 038X150 (WIRE) IMPLANT
GUIDEWIRE STR DUAL SENSOR (WIRE) ×3 IMPLANT
KIT TURNOVER KIT A (KITS) IMPLANT
MANIFOLD NEPTUNE II (INSTRUMENTS) ×3 IMPLANT
PACK CYSTO (CUSTOM PROCEDURE TRAY) ×3 IMPLANT
SHEATH URETERAL 12FRX35CM (MISCELLANEOUS) ×3 IMPLANT
STENT CONTOUR 6FRX26X.038 (STENTS) ×3 IMPLANT
TUBING CONNECTING 10 (TUBING) ×2 IMPLANT
TUBING CONNECTING 10' (TUBING) ×1
TUBING UROLOGY SET (TUBING) ×3 IMPLANT

## 2019-06-06 NOTE — Discharge Instructions (Addendum)
1. You may see some blood in the urine and may have some burning with urination for 48-72 hours. You also may notice that you have to urinate more frequently or urgently after your procedure which is normal.  2. You should call should you develop an inability urinate, fever > 101, persistent nausea and vomiting that prevents you from eating or drinking to stay hydrated.  3. If you have a stent, you will likely urinate more frequently and urgently until the stent is removed and you may experience some discomfort/pain in the lower abdomen and flank especially when urinating. You may take pain medication prescribed to you if needed for pain. You may also intermittently have blood in the urine until the stent is removed.       4.   You may remove your stent on Tuesday morning next week.  Simply pull the string that is taped to your body and the stent will easily come out.  This may be best done in the shower as some urine may come out with the stent.  Usually you will feel relief once the stent is removed, but occasionally patients can develop pain due to residual swelling of the ureter that may temporarily obstruct the kidney.  This can be managed by taking pain medication and it will typically resolve with time.  Please do not hesitate to call if you have pain that is not controlled with your pain medication or does not improved within 24-48 hours.       5.   You may restart your Plavix in 48 hrs if your urine is not too red.  Please continue aspirin 81 mg until you are back on Plavix.   General Anesthesia, Adult, Care After This sheet gives you information about how to care for yourself after your procedure. Your health care provider may also give you more specific instructions. If you have problems or questions, contact your health care provider. What can I expect after the procedure? After the procedure, the following side effects are common:  Pain or discomfort at the IV  site.  Nausea.  Vomiting.  Sore throat.  Trouble concentrating.  Feeling cold or chills.  Weak or tired.  Sleepiness and fatigue.  Soreness and body aches. These side effects can affect parts of the body that were not involved in surgery. Follow these instructions at home:  For at least 24 hours after the procedure:  Have a responsible adult stay with you. It is important to have someone help care for you until you are awake and alert.  Rest as needed.  Do not: ? Participate in activities in which you could fall or become injured. ? Drive. ? Use heavy machinery. ? Drink alcohol. ? Take sleeping pills or medicines that cause drowsiness. ? Make important decisions or sign legal documents. ? Take care of children on your own. Eating and drinking  Follow any instructions from your health care provider about eating or drinking restrictions.  When you feel hungry, start by eating small amounts of foods that are soft and easy to digest (bland), such as toast. Gradually return to your regular diet.  Drink enough fluid to keep your urine pale yellow.  If you vomit, rehydrate by drinking water, juice, or clear broth. General instructions  If you have sleep apnea, surgery and certain medicines can increase your risk for breathing problems. Follow instructions from your health care provider about wearing your sleep device: ? Anytime you are sleeping, including during daytime naps. ?  While taking prescription pain medicines, sleeping medicines, or medicines that make you drowsy.  Return to your normal activities as told by your health care provider. Ask your health care provider what activities are safe for you.  Take over-the-counter and prescription medicines only as told by your health care provider.  If you smoke, do not smoke without supervision.  Keep all follow-up visits as told by your health care provider. This is important. Contact a health care provider if:  You  have nausea or vomiting that does not get better with medicine.  You cannot eat or drink without vomiting.  You have pain that does not get better with medicine.  You are unable to pass urine.  You develop a skin rash.  You have a fever.  You have redness around your IV site that gets worse. Get help right away if:  You have difficulty breathing.  You have chest pain.  You have blood in your urine or stool, or you vomit blood. Summary  After the procedure, it is common to have a sore throat or nausea. It is also common to feel tired.  Have a responsible adult stay with you for the first 24 hours after general anesthesia. It is important to have someone help care for you until you are awake and alert.  When you feel hungry, start by eating small amounts of foods that are soft and easy to digest (bland), such as toast. Gradually return to your regular diet.  Drink enough fluid to keep your urine pale yellow.  Return to your normal activities as told by your health care provider. Ask your health care provider what activities are safe for you. This information is not intended to replace advice given to you by your health care provider. Make sure you discuss any questions you have with your health care provider. Document Released: 02/06/2001 Document Revised: 11/03/2017 Document Reviewed: 06/16/2017 Elsevier Patient Education  2020 [employer].

## 2019-06-06 NOTE — Anesthesia Postprocedure Evaluation (Signed)
Anesthesia Post Note  Patient: Arthur Cisneros.  Procedure(s) Performed: CYSTOSCOPY/URETEROSCOPY/HOLMIUM LASER/STENT PLACEMENT (Right )     Patient location during evaluation: PACU Anesthesia Type: General Level of consciousness: awake and alert Pain management: pain level controlled Vital Signs Assessment: post-procedure vital signs reviewed and stable Respiratory status: spontaneous breathing, nonlabored ventilation, respiratory function stable and patient connected to nasal cannula oxygen Cardiovascular status: blood pressure returned to baseline and stable Postop Assessment: no apparent nausea or vomiting Anesthetic complications: no Comments: Treating BP, patient very anxious.     Last Vitals:  Vitals:   06/06/19 1750 06/06/19 1755  BP:  (!) 208/102  Pulse:    Resp: 20   Temp:    SpO2: 95%      Effie Berkshire

## 2019-06-06 NOTE — OR Nursing (Signed)
Stone taken by Dr. Borden 

## 2019-06-06 NOTE — Anesthesia Procedure Notes (Signed)
Procedure Name: LMA Insertion Date/Time: 06/06/2019 3:34 PM Performed by: West Pugh, CRNA Pre-anesthesia Checklist: Patient identified, Emergency Drugs available, Suction available, Patient being monitored and Timeout performed Patient Re-evaluated:Patient Re-evaluated prior to induction Oxygen Delivery Method: Circle system utilized Preoxygenation: Pre-oxygenation with 100% oxygen Induction Type: IV induction LMA: LMA with gastric port inserted LMA Size: 5.0 Tube type: Oral Placement Confirmation: positive ETCO2 and breath sounds checked- equal and bilateral Tube secured with: Tape Dental Injury: Teeth and Oropharynx as per pre-operative assessment

## 2019-06-06 NOTE — Transfer of Care (Signed)
Immediate Anesthesia Transfer of Care Note  Patient: Arthur Cisneros.  Procedure(s) Performed: CYSTOSCOPY/URETEROSCOPY/HOLMIUM LASER/STENT PLACEMENT (Right )  Patient Location: PACU  Anesthesia Type:General  Level of Consciousness: awake, alert  and patient cooperative  Airway & Oxygen Therapy: Patient Spontanous Breathing and Patient connected to face mask oxygen  Post-op Assessment: Report given to RN and Post -op Vital signs reviewed and stable  Post vital signs: Reviewed and stable  Last Vitals:  Vitals Value Taken Time  BP    Temp    Pulse 69 06/06/19 1707  Resp 16 06/06/19 1707  SpO2 100 % 06/06/19 1707  Vitals shown include unvalidated device data.  Last Pain:  Vitals:   06/06/19 1334  TempSrc: Oral  PainSc: 2          Complications: No apparent anesthesia complications

## 2019-06-06 NOTE — Op Note (Signed)
Preoperative diagnosis: Right renal calculus  Postoperative diagnosis: Right renal calculus  Procedure:  1. Cystoscopy 2. Right ureteroscopy and stone removal 3. Ureteroscopic laser lithotripsy 4. Right ureteral stent placement (6 x 26 with string) 5. Right retrograde pyelography with interpretation  Surgeon: Pryor Curia. M.D.  Anesthesia: General  Complications: None  Intraoperative findings: Right retrograde pyelography demonstrated a filling defect within the renal pelvis consistent with the patient's known calculus without other abnormalities noted.  EBL: Minimal  Specimens: 1. Right renal calculus  Disposition of specimens: [facility] for stone analysis  Indication: Arthur Cisneros.  is a 63 y.o. patient with urolithiasis. He was found to have a large 1.5 cm right renal pelvic stone. After reviewing the management options for treatment, they elected to proceed with the above surgical procedure(s). We have discussed the potential benefits and risks of the procedure, side effects of the proposed treatment, the likelihood of the patient achieving the goals of the procedure, and any potential problems that might occur during the procedure or recuperation. Informed consent has been obtained.  Description of procedure:  The patient was taken to the operating room and general anesthesia was induced.  The patient was placed in the dorsal lithotomy position, prepped and draped in the usual sterile fashion, and preoperative antibiotics were administered. A preoperative time-out was performed.   Cystourethroscopy was performed.  The patient's urethra was examined and was normal. The bladder was then systematically examined in its entirety. There was no evidence for any bladder tumors, stones, or other mucosal pathology.    Attention then turned to the right ureteral orifice and a ureteral catheter was used to intubate the ureteral orifice.  Omnipaque contrast  was injected through the ureteral catheter and a retrograde pyelogram was performed with findings as dictated above.  A 0.38 sensor guidewire was then advanced up the right ureter into the renal pelvis under fluoroscopic guidance.  A 12/14 Fr ureteral access sheath was then advanced over the guide wire. The digital flexible ureteroscope was then advanced through the access sheath into the ureter next to the guidewire and the calculus was identified and was located in the right renal pelvis.   The stone was then fragmented with the 200 micron holmium laser fiber on a setting of 0.2 J and frequency of 50 Hz using a dusting technique. Once the stone was adequately dusted into 2 mm fragments or smaller, a sample of a small stone was removed with a zero tip nitinol basket.  Reinspection of the ureter/renal pelvis revealed no remaining visible stones or fragments of significant size.   The safety wire was then replaced and the access sheath removed.  The guidewire was backloaded through the cystoscope and a ureteral stent was advance over the wire using Seldinger technique.  The stent was positioned appropriately under fluoroscopic and cystoscopic guidance.  The wire was then removed with an adequate stent curl noted in the renal pelvis as well as in the bladder.  The bladder was then emptied and the procedure ended.  The patient appeared to tolerate the procedure well and without complications.  The patient was able to be awakened and transferred to the recovery unit in satisfactory condition.   Pryor Curia MD

## 2019-06-06 NOTE — Interval H&P Note (Signed)
History and Physical Interval Note:  06/06/2019 2:59 PM  Arthur Cisneros.  has presented today for surgery, with the diagnosis of RIGHT RENAL PELVIS CALCULUS.  The various methods of treatment have been discussed with the patient and family. After consideration of risks, benefits and other options for treatment, the patient has consented to  Procedure(s): CYSTOSCOPY/URETEROSCOPY/HOLMIUM LASER/STENT PLACEMENT (Right) as a surgical intervention.  The patient's history has been reviewed, patient examined, no change in status, stable for surgery.  I have reviewed the patient's chart and labs.  Questions were answered to the patient's satisfaction.     [employer]

## 2019-06-07 ENCOUNTER — Encounter (HOSPITAL_COMMUNITY): Payer: Self-pay | Admitting: Urology

## 2019-06-10 ENCOUNTER — Emergency Department (HOSPITAL_COMMUNITY): Payer: Medicare Other

## 2019-06-10 ENCOUNTER — Other Ambulatory Visit: Payer: Self-pay

## 2019-06-10 ENCOUNTER — Encounter (HOSPITAL_COMMUNITY): Payer: Self-pay | Admitting: Emergency Medicine

## 2019-06-10 ENCOUNTER — Inpatient Hospital Stay (HOSPITAL_COMMUNITY)
Admission: EM | Admit: 2019-06-10 | Discharge: 2019-06-14 | DRG: 854 | Disposition: A | Discharge status: Home / Self Care | Payer: Medicare Other | Attending: Internal Medicine | Admitting: Internal Medicine

## 2019-06-10 DIAGNOSIS — Z888 Allergy status to other drugs, medicaments and biological substances status: Secondary | ICD-10-CM

## 2019-06-10 DIAGNOSIS — I10 Essential (primary) hypertension: Secondary | ICD-10-CM | POA: Diagnosis present

## 2019-06-10 DIAGNOSIS — E785 Hyperlipidemia, unspecified: Secondary | ICD-10-CM | POA: Diagnosis present

## 2019-06-10 DIAGNOSIS — A4159 Other Gram-negative sepsis: Principal | ICD-10-CM | POA: Diagnosis present

## 2019-06-10 DIAGNOSIS — Z923 Personal history of irradiation: Secondary | ICD-10-CM

## 2019-06-10 DIAGNOSIS — R6520 Severe sepsis without septic shock: Secondary | ICD-10-CM | POA: Diagnosis present

## 2019-06-10 DIAGNOSIS — N136 Pyonephrosis: Secondary | ICD-10-CM | POA: Diagnosis present

## 2019-06-10 DIAGNOSIS — E860 Dehydration: Secondary | ICD-10-CM | POA: Diagnosis present

## 2019-06-10 DIAGNOSIS — Z87891 Personal history of nicotine dependence: Secondary | ICD-10-CM

## 2019-06-10 DIAGNOSIS — Z951 Presence of aortocoronary bypass graft: Secondary | ICD-10-CM

## 2019-06-10 DIAGNOSIS — N200 Calculus of kidney: Secondary | ICD-10-CM

## 2019-06-10 DIAGNOSIS — N179 Acute kidney failure, unspecified: Secondary | ICD-10-CM | POA: Diagnosis present

## 2019-06-10 DIAGNOSIS — N390 Urinary tract infection, site not specified: Secondary | ICD-10-CM

## 2019-06-10 DIAGNOSIS — M21379 Foot drop, unspecified foot: Secondary | ICD-10-CM | POA: Diagnosis present

## 2019-06-10 DIAGNOSIS — E872 Acidosis: Secondary | ICD-10-CM | POA: Diagnosis present

## 2019-06-10 DIAGNOSIS — Z8249 Family history of ischemic heart disease and other diseases of the circulatory system: Secondary | ICD-10-CM

## 2019-06-10 DIAGNOSIS — Z7984 Long term (current) use of oral hypoglycemic drugs: Secondary | ICD-10-CM

## 2019-06-10 DIAGNOSIS — E119 Type 2 diabetes mellitus without complications: Secondary | ICD-10-CM | POA: Diagnosis present

## 2019-06-10 DIAGNOSIS — Z833 Family history of diabetes mellitus: Secondary | ICD-10-CM

## 2019-06-10 DIAGNOSIS — I69354 Hemiplegia and hemiparesis following cerebral infarction affecting left non-dominant side: Secondary | ICD-10-CM

## 2019-06-10 DIAGNOSIS — Z20828 Contact with and (suspected) exposure to other viral communicable diseases: Secondary | ICD-10-CM | POA: Diagnosis present

## 2019-06-10 DIAGNOSIS — I2510 Atherosclerotic heart disease of native coronary artery without angina pectoris: Secondary | ICD-10-CM | POA: Diagnosis present

## 2019-06-10 DIAGNOSIS — A419 Sepsis, unspecified organism: Secondary | ICD-10-CM | POA: Diagnosis present

## 2019-06-10 DIAGNOSIS — Z823 Family history of stroke: Secondary | ICD-10-CM

## 2019-06-10 DIAGNOSIS — Z8261 Family history of arthritis: Secondary | ICD-10-CM

## 2019-06-10 DIAGNOSIS — Z886 Allergy status to analgesic agent status: Secondary | ICD-10-CM

## 2019-06-10 DIAGNOSIS — Z8546 Personal history of malignant neoplasm of prostate: Secondary | ICD-10-CM

## 2019-06-10 DIAGNOSIS — G40909 Epilepsy, unspecified, not intractable, without status epilepticus: Secondary | ICD-10-CM | POA: Diagnosis present

## 2019-06-10 MED ORDER — SODIUM CHLORIDE 0.9 % IV BOLUS (SEPSIS)
1000.0000 mL | Freq: Once | INTRAVENOUS | Status: AC
  Administered 2019-06-11: 1000 mL via INTRAVENOUS

## 2019-06-10 MED ORDER — SODIUM CHLORIDE 0.9 % IV SOLN
2.0000 g | Freq: Once | INTRAVENOUS | Status: AC
  Administered 2019-06-11: 2 g via INTRAVENOUS
  Filled 2019-06-10: qty 2

## 2019-06-10 NOTE — ED Triage Notes (Addendum)
Pt had stent placed in kidney on Thursday of last week. Pt started having altered mental status, fever, and body aches that started today. Pt was given 1000mg  tylenol en route.

## 2019-06-11 ENCOUNTER — Emergency Department (HOSPITAL_COMMUNITY): Payer: Medicare Other

## 2019-06-11 ENCOUNTER — Encounter (HOSPITAL_COMMUNITY)
Admission: EM | Disposition: A | Discharge status: Home / Self Care | Payer: Self-pay | Source: Home / Self Care | Attending: Internal Medicine

## 2019-06-11 ENCOUNTER — Inpatient Hospital Stay (HOSPITAL_COMMUNITY): Payer: Medicare Other | Admitting: Anesthesiology

## 2019-06-11 ENCOUNTER — Inpatient Hospital Stay (HOSPITAL_COMMUNITY): Payer: Medicare Other

## 2019-06-11 ENCOUNTER — Other Ambulatory Visit: Payer: Self-pay

## 2019-06-11 DIAGNOSIS — E860 Dehydration: Secondary | ICD-10-CM | POA: Diagnosis not present

## 2019-06-11 DIAGNOSIS — N136 Pyonephrosis: Secondary | ICD-10-CM | POA: Diagnosis not present

## 2019-06-11 DIAGNOSIS — E119 Type 2 diabetes mellitus without complications: Secondary | ICD-10-CM | POA: Diagnosis present

## 2019-06-11 DIAGNOSIS — N179 Acute kidney failure, unspecified: Secondary | ICD-10-CM | POA: Diagnosis not present

## 2019-06-11 DIAGNOSIS — I10 Essential (primary) hypertension: Secondary | ICD-10-CM | POA: Diagnosis not present

## 2019-06-11 DIAGNOSIS — Z8261 Family history of arthritis: Secondary | ICD-10-CM | POA: Diagnosis not present

## 2019-06-11 DIAGNOSIS — R7881 Bacteremia: Secondary | ICD-10-CM | POA: Diagnosis not present

## 2019-06-11 DIAGNOSIS — A419 Sepsis, unspecified organism: Secondary | ICD-10-CM | POA: Diagnosis present

## 2019-06-11 DIAGNOSIS — E872 Acidosis: Secondary | ICD-10-CM | POA: Diagnosis not present

## 2019-06-11 DIAGNOSIS — Z923 Personal history of irradiation: Secondary | ICD-10-CM | POA: Diagnosis not present

## 2019-06-11 DIAGNOSIS — I69354 Hemiplegia and hemiparesis following cerebral infarction affecting left non-dominant side: Secondary | ICD-10-CM | POA: Diagnosis not present

## 2019-06-11 DIAGNOSIS — Z833 Family history of diabetes mellitus: Secondary | ICD-10-CM | POA: Diagnosis not present

## 2019-06-11 DIAGNOSIS — Z886 Allergy status to analgesic agent status: Secondary | ICD-10-CM | POA: Diagnosis not present

## 2019-06-11 DIAGNOSIS — N390 Urinary tract infection, site not specified: Secondary | ICD-10-CM | POA: Diagnosis not present

## 2019-06-11 DIAGNOSIS — M21379 Foot drop, unspecified foot: Secondary | ICD-10-CM | POA: Diagnosis present

## 2019-06-11 DIAGNOSIS — Z7984 Long term (current) use of oral hypoglycemic drugs: Secondary | ICD-10-CM | POA: Diagnosis not present

## 2019-06-11 DIAGNOSIS — Z20828 Contact with and (suspected) exposure to other viral communicable diseases: Secondary | ICD-10-CM | POA: Diagnosis not present

## 2019-06-11 DIAGNOSIS — E785 Hyperlipidemia, unspecified: Secondary | ICD-10-CM | POA: Diagnosis not present

## 2019-06-11 DIAGNOSIS — G40909 Epilepsy, unspecified, not intractable, without status epilepticus: Secondary | ICD-10-CM | POA: Diagnosis not present

## 2019-06-11 DIAGNOSIS — Z8249 Family history of ischemic heart disease and other diseases of the circulatory system: Secondary | ICD-10-CM | POA: Diagnosis not present

## 2019-06-11 DIAGNOSIS — Z8546 Personal history of malignant neoplasm of prostate: Secondary | ICD-10-CM | POA: Diagnosis not present

## 2019-06-11 DIAGNOSIS — A4159 Other Gram-negative sepsis: Secondary | ICD-10-CM | POA: Diagnosis not present

## 2019-06-11 DIAGNOSIS — Z87891 Personal history of nicotine dependence: Secondary | ICD-10-CM | POA: Diagnosis not present

## 2019-06-11 DIAGNOSIS — N200 Calculus of kidney: Secondary | ICD-10-CM | POA: Diagnosis not present

## 2019-06-11 DIAGNOSIS — I2510 Atherosclerotic heart disease of native coronary artery without angina pectoris: Secondary | ICD-10-CM | POA: Diagnosis not present

## 2019-06-11 DIAGNOSIS — Z951 Presence of aortocoronary bypass graft: Secondary | ICD-10-CM | POA: Diagnosis not present

## 2019-06-11 DIAGNOSIS — N132 Hydronephrosis with renal and ureteral calculous obstruction: Secondary | ICD-10-CM

## 2019-06-11 DIAGNOSIS — R6520 Severe sepsis without septic shock: Secondary | ICD-10-CM

## 2019-06-11 DIAGNOSIS — Z823 Family history of stroke: Secondary | ICD-10-CM | POA: Diagnosis not present

## 2019-06-11 HISTORY — PX: CYSTOSCOPY WITH STENT PLACEMENT: SHX5790

## 2019-06-11 HISTORY — DX: Sepsis, unspecified organism: A41.9

## 2019-06-11 LAB — URINALYSIS, ROUTINE W REFLEX MICROSCOPIC
Bacteria, UA: NONE SEEN
Bilirubin Urine: NEGATIVE
Glucose, UA: NEGATIVE mg/dL
Ketones, ur: 5 mg/dL — AB
Nitrite: NEGATIVE
Protein, ur: 100 mg/dL — AB
RBC / HPF: >50 RBC/hpf — ABNORMAL HIGH (ref 0–5)
Specific Gravity, Urine: 1.023 (ref 1.005–1.030)
WBC, UA: >50 WBC/hpf — ABNORMAL HIGH (ref 0–5)
pH: 5 (ref 5.0–8.0)

## 2019-06-11 LAB — COMPREHENSIVE METABOLIC PANEL
ALT: 14 U/L (ref 0–44)
ALT: 15 U/L (ref 0–44)
AST: 21 U/L (ref 15–41)
AST: 21 U/L (ref 15–41)
Albumin: 3.2 g/dL — ABNORMAL LOW (ref 3.5–5.0)
Albumin: 3.6 g/dL (ref 3.5–5.0)
Alkaline Phosphatase: 30 U/L — ABNORMAL LOW (ref 38–126)
Alkaline Phosphatase: 35 U/L — ABNORMAL LOW (ref 38–126)
Anion gap: 10 (ref 5–15)
Anion gap: 12 (ref 5–15)
BUN: 18 mg/dL (ref 8–23)
BUN: 18 mg/dL (ref 8–23)
CO2: 18 mmol/L — ABNORMAL LOW (ref 22–32)
CO2: 20 mmol/L — ABNORMAL LOW (ref 22–32)
Calcium: 7.7 mg/dL — ABNORMAL LOW (ref 8.9–10.3)
Calcium: 8.3 mg/dL — ABNORMAL LOW (ref 8.9–10.3)
Chloride: 108 mmol/L (ref 98–111)
Chloride: 108 mmol/L (ref 98–111)
Creatinine, Ser: 1.3 mg/dL — ABNORMAL HIGH (ref 0.61–1.24)
Creatinine, Ser: 1.41 mg/dL — ABNORMAL HIGH (ref 0.61–1.24)
GFR calc Af Amer: >60 mL/min (ref >60)
GFR calc Af Amer: >60 mL/min (ref >60)
GFR calc non Af Amer: 53 mL/min — ABNORMAL LOW (ref >60)
GFR calc non Af Amer: 58 mL/min — ABNORMAL LOW (ref >60)
Glucose, Bld: 163 mg/dL — ABNORMAL HIGH (ref 70–99)
Glucose, Bld: 163 mg/dL — ABNORMAL HIGH (ref 70–99)
Potassium: 3.4 mmol/L — ABNORMAL LOW (ref 3.5–5.1)
Potassium: 4 mmol/L (ref 3.5–5.1)
Sodium: 136 mmol/L (ref 135–145)
Sodium: 140 mmol/L (ref 135–145)
Total Bilirubin: 1 mg/dL (ref 0.3–1.2)
Total Bilirubin: 1 mg/dL (ref 0.3–1.2)
Total Protein: 6.1 g/dL — ABNORMAL LOW (ref 6.5–8.1)
Total Protein: 6.7 g/dL (ref 6.5–8.1)

## 2019-06-11 LAB — CBC
HCT: 44.7 % (ref 39.0–52.0)
Hemoglobin: 14.1 g/dL (ref 13.0–17.0)
MCH: 30 pg (ref 26.0–34.0)
MCHC: 31.5 g/dL (ref 30.0–36.0)
MCV: 95.1 fL (ref 80.0–100.0)
Platelets: 144 10*3/uL — ABNORMAL LOW (ref 150–400)
RBC: 4.7 MIL/uL (ref 4.22–5.81)
RDW: 13.6 % (ref 11.5–15.5)
WBC: 15.8 10*3/uL — ABNORMAL HIGH (ref 4.0–10.5)
nRBC: 0 % (ref 0.0–0.2)

## 2019-06-11 LAB — CBC WITH DIFFERENTIAL/PLATELET
Abs Immature Granulocytes: 0.05 10*3/uL (ref 0.00–0.07)
Basophils Absolute: 0 10*3/uL (ref 0.0–0.1)
Basophils Relative: 0 %
Eosinophils Absolute: 0 10*3/uL (ref 0.0–0.5)
Eosinophils Relative: 0 %
HCT: 46.9 % (ref 39.0–52.0)
Hemoglobin: 15.2 g/dL (ref 13.0–17.0)
Immature Granulocytes: 0 %
Lymphocytes Relative: 2 %
Lymphs Abs: 0.3 10*3/uL — ABNORMAL LOW (ref 0.7–4.0)
MCH: 29.8 pg (ref 26.0–34.0)
MCHC: 32.4 g/dL (ref 30.0–36.0)
MCV: 92 fL (ref 80.0–100.0)
Monocytes Absolute: 0.4 10*3/uL (ref 0.1–1.0)
Monocytes Relative: 3 %
Neutro Abs: 13.6 10*3/uL — ABNORMAL HIGH (ref 1.7–7.7)
Neutrophils Relative %: 95 %
Platelets: 182 10*3/uL (ref 150–400)
RBC: 5.1 MIL/uL (ref 4.22–5.81)
RDW: 13.3 % (ref 11.5–15.5)
WBC: 14.5 10*3/uL — ABNORMAL HIGH (ref 4.0–10.5)
nRBC: 0 % (ref 0.0–0.2)

## 2019-06-11 LAB — SARS CORONAVIRUS 2 BY RT PCR (HOSPITAL ORDER, PERFORMED IN ~~LOC~~ HOSPITAL LAB): SARS Coronavirus 2: NEGATIVE

## 2019-06-11 LAB — GLUCOSE, CAPILLARY
Glucose-Capillary: 144 mg/dL — ABNORMAL HIGH (ref 70–99)
Glucose-Capillary: 197 mg/dL — ABNORMAL HIGH (ref 70–99)
Glucose-Capillary: 80 mg/dL (ref 70–99)
Glucose-Capillary: 86 mg/dL (ref 70–99)

## 2019-06-11 LAB — PROTIME-INR
INR: 1.2 (ref 0.8–1.2)
Prothrombin Time: 14.6 seconds (ref 11.4–15.2)

## 2019-06-11 LAB — APTT: aPTT: 25 seconds (ref 24–36)

## 2019-06-11 LAB — MRSA PCR SCREENING: MRSA by PCR: NEGATIVE

## 2019-06-11 LAB — LACTIC ACID, PLASMA
Lactic Acid, Venous: 3 mmol/L (ref 0.5–1.9)
Lactic Acid, Venous: 4 mmol/L (ref 0.5–1.9)

## 2019-06-11 LAB — HEMOGLOBIN A1C
Hgb A1c MFr Bld: 6 % — ABNORMAL HIGH (ref 4.8–5.6)
Mean Plasma Glucose: 125.5 mg/dL

## 2019-06-11 SURGERY — CYSTOSCOPY, WITH STENT INSERTION
Anesthesia: General | Laterality: Right

## 2019-06-11 MED ORDER — ONDANSETRON HCL 4 MG/2ML IJ SOLN
4.0000 mg | Freq: Four times a day (QID) | INTRAMUSCULAR | Status: DC | PRN
  Administered 2019-06-11: 4 mg via INTRAVENOUS
  Filled 2019-06-11: qty 2

## 2019-06-11 MED ORDER — PHENAZOPYRIDINE HCL 100 MG PO TABS
100.0000 mg | ORAL_TABLET | Freq: Three times a day (TID) | ORAL | Status: AC
  Administered 2019-06-11 – 2019-06-13 (×6): 100 mg via ORAL
  Filled 2019-06-11 (×6): qty 1

## 2019-06-11 MED ORDER — DIATRIZOATE MEGLUMINE 30 % UR SOLN
URETHRAL | Status: DC | PRN
  Administered 2019-06-11: 05:00:00 30 mL via URETHRAL

## 2019-06-11 MED ORDER — ONDANSETRON HCL 4 MG PO TABS: 4.0000 mg | ORAL_TABLET | Freq: Four times a day (QID) | ORAL | Status: DC | PRN

## 2019-06-11 MED ORDER — KETAMINE HCL 10 MG/ML IJ SOLN
INTRAMUSCULAR | Status: DC | PRN
  Administered 2019-06-11: 15 mg via INTRAVENOUS

## 2019-06-11 MED ORDER — HYDROCODONE-ACETAMINOPHEN 7.5-325 MG PO TABS: 1.0000 | ORAL_TABLET | Freq: Once | ORAL | Status: DC | PRN

## 2019-06-11 MED ORDER — ACETAMINOPHEN 325 MG PO TABS
650.0000 mg | ORAL_TABLET | Freq: Four times a day (QID) | ORAL | Status: DC | PRN
  Administered 2019-06-11 – 2019-06-14 (×5): 650 mg via ORAL
  Filled 2019-06-11 (×5): qty 2

## 2019-06-11 MED ORDER — SODIUM CHLORIDE 0.9 % IR SOLN
Status: DC | PRN
  Administered 2019-06-11: 3000 mL via INTRAVESICAL

## 2019-06-11 MED ORDER — LEVETIRACETAM 250 MG PO TABS
250.0000 mg | ORAL_TABLET | Freq: Two times a day (BID) | ORAL | Status: DC
  Administered 2019-06-11 – 2019-06-14 (×7): 250 mg via ORAL
  Filled 2019-06-11 (×7): qty 1

## 2019-06-11 MED ORDER — ATORVASTATIN CALCIUM 40 MG PO TABS
40.0000 mg | ORAL_TABLET | Freq: Every day | ORAL | Status: DC
  Administered 2019-06-11 – 2019-06-14 (×4): 40 mg via ORAL
  Filled 2019-06-11 (×4): qty 1

## 2019-06-11 MED ORDER — SODIUM CHLORIDE 0.9 % IV SOLN
2.0000 g | Freq: Two times a day (BID) | INTRAVENOUS | Status: DC
  Administered 2019-06-11 – 2019-06-12 (×2): 2 g via INTRAVENOUS
  Filled 2019-06-11 (×3): qty 2

## 2019-06-11 MED ORDER — DIATRIZOATE MEGLUMINE 30 % UR SOLN
URETHRAL | Status: AC
  Filled 2019-06-11: qty 100

## 2019-06-11 MED ORDER — STERILE WATER FOR IRRIGATION IR SOLN
Status: DC | PRN
  Administered 2019-06-11: 50 mL

## 2019-06-11 MED ORDER — MIDAZOLAM HCL 2 MG/2ML IJ SOLN: 0.5000 mg | Freq: Once | INTRAMUSCULAR | Status: DC | PRN

## 2019-06-11 MED ORDER — ACETAMINOPHEN 325 MG PO TABS: 650.0000 mg | ORAL_TABLET | Freq: Once | ORAL | Status: DC

## 2019-06-11 MED ORDER — INSULIN ASPART 100 UNIT/ML ~~LOC~~ SOLN
0.0000 [IU] | Freq: Four times a day (QID) | SUBCUTANEOUS | Status: DC
  Administered 2019-06-11: 2 [IU] via SUBCUTANEOUS
  Administered 2019-06-12 – 2019-06-13 (×3): 1 [IU] via SUBCUTANEOUS

## 2019-06-11 MED ORDER — HYDROMORPHONE HCL 1 MG/ML IJ SOLN: 0.2500 mg | INTRAMUSCULAR | Status: DC | PRN

## 2019-06-11 MED ORDER — PROMETHAZINE HCL 25 MG/ML IJ SOLN
6.2500 mg | INTRAMUSCULAR | Status: DC | PRN
  Administered 2019-06-11: 6.25 mg via INTRAVENOUS
  Filled 2019-06-11: qty 1

## 2019-06-11 MED ORDER — SUCCINYLCHOLINE CHLORIDE 20 MG/ML IJ SOLN
INTRAMUSCULAR | Status: DC | PRN
  Administered 2019-06-11: 140 mg via INTRAVENOUS

## 2019-06-11 MED ORDER — PROPOFOL 10 MG/ML IV BOLUS
INTRAVENOUS | Status: DC | PRN
  Administered 2019-06-11: 150 mg via INTRAVENOUS

## 2019-06-11 MED ORDER — TIZANIDINE HCL 2 MG PO TABS: 2.0000 mg | ORAL_TABLET | Freq: Every evening | ORAL | Status: DC | PRN

## 2019-06-11 MED ORDER — LACTATED RINGERS IV SOLN: INTRAVENOUS | Status: DC

## 2019-06-11 SURGICAL SUPPLY — 23 items
BAG DRAIN URO TABLE W/ADPT NS (BAG) ×2 IMPLANT
BAG HAMPER (MISCELLANEOUS) ×2 IMPLANT
CATH FOLEY 2WAY SLVR  5CC 18FR (CATHETERS) ×2
CATH FOLEY 2WAY SLVR 5CC 18FR (CATHETERS) IMPLANT
CATH INTERMIT  6FR 70CM (CATHETERS) ×2 IMPLANT
CLOTH BEACON ORANGE TIMEOUT ST (SAFETY) ×2 IMPLANT
GLOVE BIO SURGEON STRL SZ7.5 (GLOVE) ×2 IMPLANT
GLOVE BIOGEL PI IND STRL 6.5 (GLOVE) IMPLANT
GLOVE BIOGEL PI IND STRL 7.0 (GLOVE) IMPLANT
GLOVE BIOGEL PI INDICATOR 6.5 (GLOVE) ×2
GLOVE BIOGEL PI INDICATOR 7.0 (GLOVE) ×2
GOWN STRL REUS W/ TWL XL LVL3 (GOWN DISPOSABLE) IMPLANT
GOWN STRL REUS W/TWL XL LVL3 (GOWN DISPOSABLE) ×6
GUIDEWIRE STR DUAL SENSOR (WIRE) ×2 IMPLANT
IV NS 1000ML (IV SOLUTION)
IV NS 1000ML BAXH (IV SOLUTION) IMPLANT
IV NS IRRIG 3000ML ARTHROMATIC (IV SOLUTION) ×2 IMPLANT
KIT TURNOVER CYSTO (KITS) ×2 IMPLANT
MANIFOLD NEPTUNE II (INSTRUMENTS) ×2 IMPLANT
PACK CYSTO (CUSTOM PROCEDURE TRAY) ×2 IMPLANT
STENT URET 6FRX26 CONTOUR (STENTS) ×2 IMPLANT
TOWEL OR 17X24 6PK STRL BLUE (TOWEL DISPOSABLE) ×2 IMPLANT
WATER STERILE IRR 500ML POUR (IV SOLUTION) ×2 IMPLANT

## 2019-06-11 NOTE — Anesthesia Preprocedure Evaluation (Addendum)
Anesthesia Evaluation  Patient identified by MRN, date of birth, ID band Patient awake    Reviewed: Allergy & Precautions, NPO status , Patient's Chart, lab work & pertinent test results  Airway Mallampati: I  TM Distance: >3 FB Neck ROM: Full    Dental no notable dental hx. (+) Edentulous Upper, Edentulous Lower   Pulmonary neg pulmonary ROS, former smoker,    Pulmonary exam normal breath sounds clear to auscultation       Cardiovascular Exercise Tolerance: Good hypertension, + CAD and + CABG  Normal cardiovascular examI Rhythm:Regular Rate:Normal     Neuro/Psych  Headaches, Seizures -,  Anxiety CVA, Residual Symptoms negative psych ROS   GI/Hepatic Neg liver ROS, GERD  Medicated and Controlled,  Endo/Other  negative endocrine ROSdiabetes, Type 2  Renal/GU negative Renal ROSH/o prostate Ca , h/o recent stent -here for cysto stent change  negative genitourinary   Musculoskeletal  (+) Arthritis , Osteoarthritis,    Abdominal   Peds negative pediatric ROS (+)  Hematology negative hematology ROS (+)   Anesthesia Other Findings   Reproductive/Obstetrics negative OB ROS                            Anesthesia Physical Anesthesia Plan  ASA: III and emergent  Anesthesia Plan: General   Post-op Pain Management:    Induction: Intravenous  PONV Risk Score and Plan: 2 and Treatment may vary due to age or medical condition and Ondansetron  Airway Management Planned: Oral ETT  Additional Equipment:   Intra-op Plan:   Post-operative Plan: Extubation in OR  Informed Consent: I have reviewed the patients History and Physical, chart, labs and discussed the procedure including the risks, benefits and alternatives for the proposed anesthesia with the patient or authorized representative who has indicated his/her understanding and acceptance.     Dental advisory given  Plan Discussed with:  CRNA  Anesthesia Plan Comments: (Plan Full PPE  Plan GETA)        Anesthesia Quick Evaluation

## 2019-06-11 NOTE — Progress Notes (Signed)
Pharmacy Antibiotic Note  Arthur Maxcy. is a 63 y.o. male admitted on 06/10/2019 with UTI.  Pharmacy has been consulted for Cefepime dosing.  Plan: Cefepime 2000 mg IV every 12 hours.  Monitor labs, c/s, and patient improvement.  Height: 5\' 9"  (175.3 cm) Weight: 210 lb (95.3 kg) IBW/kg (Calculated) : 70.7  Temp (24hrs), Avg:99.2 F (37.3 C), Min:98.1 F (36.7 C), Max:102 F (38.9 C)  Recent Labs  Lab 06/11/19 0003 06/11/19 0321  WBC 14.5*  --   CREATININE 1.30*  --   LATICACIDVEN 4.0* 3.0*    Estimated Creatinine Clearance: 66.2 mL/min (A) (by C-G formula based on SCr of 1.3 mg/dL (H)).    Allergies  Allergen Reactions  . Lyrica [Pregabalin]     hallucination  . Motrin [Ibuprofen]     "dr said I can not take it."    Antimicrobials this admission: Cefepime 7/28 >>      Dose adjustments this admission: N/A  Microbiology results: 7/28 BCx: pending 7/28 UCx: pending 728 MRSA PCR: pending  Thank you for allowing pharmacy to be a part of this patient's care.  Ramond Craver 06/11/2019 7:43 AM

## 2019-06-11 NOTE — Progress Notes (Signed)
Patient seen and examined. Admitted after midnight secondary to sepsis due to complicated UTI. Had recent cystoscopy and ureteroscopy for treatment of kidney stone; stent got dislodge and came out on it's own. Patient meeting sepsis criteria on admission with fever, AMS, elevated WBC's, AKI and tachycardia; abnormal UA suggesting infection. CT scan demonstrated mild-to moderate right hydronephrosis and hydroureter. Lactic acid 4.0 on presentation. Hemodynamically stable at this time. Please refer to H&P written by Dr. Darrick Meigs for further info/details on admission.  Plan: -will continue IVF's -continue current IV antibiotics -follow urine culture -PRN antipyretics and analgesics -Urology service on board, patient status post cystoscopy and re-stenting; will follow recommendations. -continue supportive care.  Barton Dubois MD [phone]

## 2019-06-11 NOTE — H&P (Addendum)
TRH H&P    Patient Demographics:    Arthur Cisneros, is a 63 y.o. male  MRN: [id number]  DOB - 12/27/1955  Admit Date - 06/10/2019  Referring MD/NP/PA: Thayer Jew  Outpatient Primary MD for the patient is Shade Flood Satira Anis, MD  Patient coming from: Home  Chief complaint-fever   HPI:    Arthur Cisneros  is a 63 y.o. male, with history of hypertension, hyperlipidemia, diabetes mellitus type 2, kidney stone with recent stent placement who presented to ED with fever and chills.  Patient had a ureteral stent placed last Thursday he was due to come out later today.  However it fell out yesterday morning.  He also noticed jelly type discharge after stent was pulled out.  Patient has been fatigued, complains of nausea also had fever. In the ED patient was found to have abnormal UA, with greater than 50 WBC, greater than 50 RBC, lactic acid 4.0.  Patient started on IV cefepime.  Urine culture obtained. Urology was consulted by ED physician, CT scan was ordered after discussion with urology which showed multiple stones in the right ureter.  Plan for stenting today. He has a history of stroke, seizures No history of CAD     Review of systems:    In addition to the HPI above,    All other systems reviewed and are negative.    Past History of the following :    Past Medical History:  Diagnosis Date  . Anxiety    pt denies  . Coronary artery disease   . Diabetes mellitus without complication ([place])    type 2  . History of kidney stones   . Hyperlipidemia   . Hypertension   . Prostate cancer ([place]) 05-2015   radiation CC in [place]  . Stroke [facility])    left side weakness with drop foot      Past Surgical History:  Procedure Laterality Date  . CORONARY ARTERY BYPASS GRAFT     4 graft  . CYSTOSCOPY/URETEROSCOPY/HOLMIUM LASER/STENT PLACEMENT Right 06/06/2019   Procedure: CYSTOSCOPY/URETEROSCOPY/HOLMIUM  LASER/STENT PLACEMENT;  Surgeon: Raynelle Bring, MD;  Location: [facility];  Service: Urology;  Laterality: Right;  . PROSTATE BIOPSY    . right hand surgery        Social History:      Social History   Tobacco Use  . Smoking status: Former Smoker    Packs/day: 0.25    Years: 15.00    Pack years: 3.75    Types: Cigarettes    Start date: 10/19/1970    Quit date: 10/19/1985    Years since quitting: 33.6  . Smokeless tobacco: Current User    Types: Chew  Substance Use Topics  . Alcohol use: No    Alcohol/week: 0.0 standard drinks       Family History :     Family History  Problem Relation Age of Onset  . Heart disease Mother   . Diabetes Mother   . Arthritis Mother   . Heart disease Father   . Diabetes Father   .  Stroke Father   . Stroke Other   . Gout Other   . Coronary artery disease Other       Home Medications:   Prior to Admission medications   Medication Sig Start Date End Date Taking? Authorizing Provider  acetaminophen (TYLENOL) 500 MG tablet Take by mouth every 6 (six) hours as needed (pain).     [provider]  atorvastatin (LIPITOR) 40 MG tablet Take 40 mg by mouth daily.    [provider]  HYDROcodone-acetaminophen (NORCO/VICODIN) 5-325 MG tablet Take 1-2 tablets by mouth every 6 (six) hours as needed. 06/06/19   Raynelle Bring, MD  levETIRAcetam (KEPPRA) 250 MG tablet TAKE 1 TABLET BY MOUTH TWICE DAILY 01/08/19   Garvin Fila, MD  losartan (COZAAR) 25 MG tablet Take 25 mg by mouth daily.    [provider]  metFORMIN (GLUCOPHAGE-XR) 500 MG 24 hr tablet Take 500 mg by mouth 2 (two) times a day.  03/01/19   [provider]  nitroGLYCERIN (NITROSTAT) 0.4 MG SL tablet Place 0.4 mg under the tongue every 5 (five) minutes as needed.    [provider]  tiZANidine (ZANAFLEX) 2 MG tablet TAKE 1 TABLET BY MOUTH AT BEDTIME AS NEEDED. Patient taking differently: Take 2 mg by mouth at bedtime as needed for muscle spasms.  TAKE 1 TABLET BY MOUTH AT BEDTIME AS NEEDED. 01/09/19   Dennie Bible, NP     Allergies:     Allergies  Allergen Reactions  . Lyrica [Pregabalin]     hallucination  . Motrin [Ibuprofen]     "dr said I can not take it."     Physical Exam:   Vitals  Blood pressure 119/64, pulse 94, temperature 98.9 F (37.2 C), temperature source Oral, resp. rate (!) 30, height 5\' 9"  (1.753 m), weight 95.3 kg, SpO2 96 %.  1.  General: Appears in no acute distress  2. Psychiatric: Alert, oriented x3, intact insight and judgment  3. Neurologic: Cranial nerves II through XII grossly intact, motor strength 5/5 in all extremities  4. HEENMT:  Atraumatic normocephalic, extraocular muscles are intact, oral mucosa is pink and moist  5. Respiratory : Clear to auscultation bilaterally, no wheezing or crackles auscultated  6. Cardiovascular : S1-S2, regular, no murmur auscultated  7. Gastrointestinal:  Abdomen is soft, positive right CVA tenderness, no rigidity or guarding noted  8. Skin:  No rashes noted      Data Review:    CBC Recent Labs  Lab 06/11/19 0003  WBC 14.5*  HGB 15.2  HCT 46.9  PLT 182  MCV 92.0  MCH 29.8  MCHC 32.4  RDW 13.3  LYMPHSABS 0.3*  MONOABS 0.4  EOSABS 0.0  BASOSABS 0.0   ------------------------------------------------------------------------------------------------------------------  Results for orders placed or performed during the hospital encounter of 06/10/19 (from the past 48 hour(s))  SARS Coronavirus 2 (CEPHEID - Performed in [place] hospital lab), Hosp Order     Status: None   Collection Time: 06/10/19 11:59 PM   Specimen: Nasopharyngeal Swab  Result Value Ref Range   SARS Coronavirus 2 NEGATIVE NEGATIVE    Comment: (NOTE) If result is NEGATIVE SARS-CoV-2 target nucleic acids are NOT DETECTED. The SARS-CoV-2 RNA is generally detectable in upper and lower  respiratory specimens during the acute phase of infection. The lowest   concentration of SARS-CoV-2 viral copies this assay can detect is 250  copies / mL. A negative result does not preclude SARS-CoV-2 infection  and should not be used  as the sole basis for treatment or other  patient management decisions.  A negative result may occur with  improper specimen collection / handling, submission of specimen other  than nasopharyngeal swab, presence of viral mutation(s) within the  areas targeted by this assay, and inadequate number of viral copies  (<250 copies / mL). A negative result must be combined with clinical  observations, patient history, and epidemiological information. If result is POSITIVE SARS-CoV-2 target nucleic acids are DETECTED. The SARS-CoV-2 RNA is generally detectable in upper and lower  respiratory specimens dur ing the acute phase of infection.  Positive  results are indicative of active infection with SARS-CoV-2.  Clinical  correlation with patient history and other diagnostic information is  necessary to determine patient infection status.  Positive results do  not rule out bacterial infection or co-infection with other viruses. If result is PRESUMPTIVE POSTIVE SARS-CoV-2 nucleic acids MAY BE PRESENT.   A presumptive positive result was obtained on the submitted specimen  and confirmed on repeat testing.  While 2019 novel coronavirus  (SARS-CoV-2) nucleic acids may be present in the submitted sample  additional confirmatory testing may be necessary for epidemiological  and / or clinical management purposes  to differentiate between  SARS-CoV-2 and other Sarbecovirus currently known to infect humans.  If clinically indicated additional testing with an alternate test  methodology [phone]) is advised. The SARS-CoV-2 RNA is generally  detectable in upper and lower respiratory sp ecimens during the acute  phase of infection. The expected result is Negative. Fact Sheet for Patients:  StrictlyIdeas.no Fact Sheet  for Healthcare Providers: BankingDealers.co.za This test is not yet approved or cleared by the [place] FDA and has been authorized for detection and/or diagnosis of SARS-CoV-2 by FDA under an Emergency Use Authorization (EUA).  This EUA will remain in effect (meaning this test can be used) for the duration of the COVID-19 declaration under Section 564(b)(1) of the Act, 21 U.S.C. section 360bbb-3(b)(1), unless the authorization is terminated or revoked sooner. Performed at [facility], [address], [place], [place] [address]   Lactic acid, plasma     Status: Abnormal   Collection Time: 06/11/19 12:03 AM  Result Value Ref Range   Lactic Acid, Venous 4.0 (HH) 0.5 - 1.9 mmol/L    Comment: CRITICAL RESULT CALLED TO, READ BACK BY AND VERIFIED WITH: Andres Ege @0036  06/11/19 MKELLY Performed at [facility], [address] [place], [place] [address]   Comprehensive metabolic panel     Status: Abnormal   Collection Time: 06/11/19 12:03 AM  Result Value Ref Range   Sodium 136 135 - 145 mmol/L   Potassium 3.4 (L) 3.5 - 5.1 mmol/L   Chloride 108 98 - 111 mmol/L   CO2 18 (L) 22 - 32 mmol/L   Glucose, Bld 163 (H) 70 - 99 mg/dL   BUN 18 8 - 23 mg/dL   Creatinine, Ser 1.30 (H) 0.61 - 1.24 mg/dL   Calcium 8.3 (L) 8.9 - 10.3 mg/dL   Total Protein 6.7 6.5 - 8.1 g/dL   Albumin 3.6 3.5 - 5.0 g/dL   AST 21 15 - 41 U/L   ALT 15 0 - 44 U/L   Alkaline Phosphatase 35 (L) 38 - 126 U/L   Total Bilirubin 1.0 0.3 - 1.2 mg/dL   GFR calc non Af Amer 58 (L) >60 mL/min   GFR calc Af Amer >60 >60 mL/min   Anion gap 10 5 - 15    Comment: Performed at [facility], [address], [place], [place] [address]  CBC WITH DIFFERENTIAL     Status: Abnormal   Collection Time: 06/11/19 12:03 AM  Result Value Ref Range   WBC 14.5 (H) 4.0 - 10.5 K/uL   RBC 5.10 4.22 - 5.81 MIL/uL   Hemoglobin 15.2 13.0 - 17.0 g/dL   HCT 46.9 39.0 - 52.0 %   MCV 92.0 80.0 - 100.0 fL   MCH 29.8 26.0 -  34.0 pg   MCHC 32.4 30.0 - 36.0 g/dL   RDW 13.3 11.5 - 15.5 %   Platelets 182 150 - 400 K/uL   nRBC 0.0 0.0 - 0.2 %   Neutrophils Relative % 95 %   Neutro Abs 13.6 (H) 1.7 - 7.7 K/uL   Lymphocytes Relative 2 %   Lymphs Abs 0.3 (L) 0.7 - 4.0 K/uL   Monocytes Relative 3 %   Monocytes Absolute 0.4 0.1 - 1.0 K/uL   Eosinophils Relative 0 %   Eosinophils Absolute 0.0 0.0 - 0.5 K/uL   Basophils Relative 0 %   Basophils Absolute 0.0 0.0 - 0.1 K/uL   Immature Granulocytes 0 %   Abs Immature Granulocytes 0.05 0.00 - 0.07 K/uL    Comment: Performed at [facility], [address], [place], [place] [address]  APTT     Status: None   Collection Time: 06/11/19 12:03 AM  Result Value Ref Range   aPTT 25 24 - 36 seconds    Comment: Performed at [facility], [address], [place], [place] [address]  Protime-INR     Status: None   Collection Time: 06/11/19 12:03 AM  Result Value Ref Range   Prothrombin Time 14.6 11.4 - 15.2 seconds   INR 1.2 0.8 - 1.2    Comment: (NOTE) INR goal varies based on device and disease states. Performed at [facility], [address], [place], [place] [address]   Urinalysis, Routine w reflex microscopic     Status: Abnormal   Collection Time: 06/11/19 12:39 AM  Result Value Ref Range   Color, Urine AMBER (A) YELLOW    Comment: BIOCHEMICALS MAY BE AFFECTED BY COLOR   APPearance CLOUDY (A) CLEAR   Specific Gravity, Urine 1.023 1.005 - 1.030   pH 5.0 5.0 - 8.0   Glucose, UA NEGATIVE NEGATIVE mg/dL   Hgb urine dipstick LARGE (A) NEGATIVE   Bilirubin Urine NEGATIVE NEGATIVE   Ketones, ur 5 (A) NEGATIVE mg/dL   Protein, ur 100 (A) NEGATIVE mg/dL   Nitrite NEGATIVE NEGATIVE   Leukocytes,Ua LARGE (A) NEGATIVE   RBC / HPF >50 (H) 0 - 5 RBC/hpf   WBC, UA >50 (H) 0 - 5 WBC/hpf   Bacteria, UA NONE SEEN NONE SEEN   WBC Clumps PRESENT    Mucus PRESENT     Comment: Performed at [facility], [address] [place], [place] [address]    Chemistries  Recent Labs   Lab 06/11/19 0003  NA 136  K 3.4*  CL 108  CO2 18*  GLUCOSE 163*  BUN 18  CREATININE 1.30*  CALCIUM 8.3*  AST 21  ALT 15  ALKPHOS 35*  BILITOT 1.0   ------------------------------------------------------------------------------------------------------------------  ------------------------------------------------------------------------------------------------------------------ GFR: Estimated Creatinine Clearance: 66.2 mL/min (A) (by C-G formula based on SCr of 1.3 mg/dL (H)). Liver Function Tests: Recent Labs  Lab 06/11/19 0003  AST 21  ALT 15  ALKPHOS 35*  BILITOT 1.0  PROT 6.7  ALBUMIN 3.6   No results for input(s): LIPASE, AMYLASE in the last  168 hours. No results for input(s): AMMONIA in the last 168 hours. Coagulation Profile: Recent Labs  Lab 06/11/19 0003  INR 1.2   Cardiac Enzymes: No results for input(s): CKTOTAL, CKMB, CKMBINDEX, TROPONINI in the last 168 hours. BNP (last 3 results) No results for input(s): PROBNP in the last 8760 hours. HbA1C: No results for input(s): HGBA1C in the last 72 hours. CBG: Recent Labs  Lab 06/06/19 1325 06/06/19 1715  GLUCAP 103* 127*   Lipid Profile: No results for input(s): CHOL, HDL, LDLCALC, TRIG, CHOLHDL, LDLDIRECT in the last 72 hours. Thyroid Function Tests: No results for input(s): TSH, T4TOTAL, FREET4, T3FREE, THYROIDAB in the last 72 hours. Anemia Panel: No results for input(s): VITAMINB12, FOLATE, FERRITIN, TIBC, IRON, RETICCTPCT in the last 72 hours.  --------------------------------------------------------------------------------------------------------------- Urine analysis:    Component Value Date/Time   COLORURINE AMBER (A) 06/11/2019 0039   APPEARANCEUR CLOUDY (A) 06/11/2019 0039   LABSPEC 1.023 06/11/2019 0039   PHURINE 5.0 06/11/2019 0039   GLUCOSEU NEGATIVE 06/11/2019 0039   HGBUR LARGE (A) 06/11/2019 0039   BILIRUBINUR NEGATIVE 06/11/2019 0039   KETONESUR 5 (A) 06/11/2019 0039    PROTEINUR 100 (A) 06/11/2019 0039   UROBILINOGEN 1.0 01/18/2011 1829   NITRITE NEGATIVE 06/11/2019 0039   LEUKOCYTESUR LARGE (A) 06/11/2019 0039      Imaging Results:    Dg Abdomen 1 View  Result Date: 06/11/2019 CLINICAL DATA:  Status post right renal stent EXAM: ABDOMEN - 1 VIEW COMPARISON:  CT abdomen/pelvis dated 05/10/2019 FINDINGS: Nonobstructive bowel gas pattern. No renal stent is visualized. Fiducial markers in the region of the prostate. IMPRESSION: No renal stent is visualized. Electronically Signed   By: Julian Hy M.D.   On: 06/11/2019 00:27   Dg Chest Portable 1 View  Result Date: 06/11/2019 CLINICAL DATA:  Fever, body aches, altered mental status EXAM: PORTABLE CHEST 1 VIEW COMPARISON:  None. FINDINGS: Low lung volumes with bibasilar atelectasis. No focal consolidation. No pleural effusion or pneumothorax. The heart is normal in size. Postsurgical changes related to prior CABG. Median sternotomy. IMPRESSION: Low lung volumes with bibasilar atelectasis. Electronically Signed   By: Julian Hy M.D.   On: 06/11/2019 01:39   Ct Renal Stone Study  Result Date: 06/11/2019 CLINICAL DATA:  Stent placed recently, altered mental status EXAM: CT ABDOMEN AND PELVIS WITHOUT CONTRAST TECHNIQUE: Multidetector CT imaging of the abdomen and pelvis was performed following the standard protocol without IV contrast. COMPARISON:  Radiograph 06/10/2019, CT 05/10/2019 FINDINGS: Lower chest: Lung bases demonstrate no pleural effusion. Partial atelectasis at the bilateral lung bases. Coronary vascular calcification. Ectatic aortic root measuring up to 3.8 cm. Small hiatal hernia. Hepatobiliary: Small calcified gallstones. No focal hepatic abnormality. No biliary dilatation Pancreas: Unremarkable. No pancreatic ductal dilatation or surrounding inflammatory changes. Spleen: Normal in size without focal abnormality. Adrenals/Urinary Tract: Adrenal glands are normal. Moderate bilateral perinephric  fat stranding, increased compared to prior. Numerous stone fragments within the right kidney measuring up to 6 mm, previously noted dominant right renal pelvis stone no longer identified. Mild to moderate right hydronephrosis and hydroureter, secondary to multiple small stones in the distal right ureter ovary 2 cm segment of ureter. Stones measure up to 3 mm in diameter. These are visualized at the mid sacral level. Urinary bladder unremarkable Stomach/Bowel: Stomach is within normal limits. Appendix appears normal. No evidence of bowel wall thickening, distention, or inflammatory changes. Vascular/Lymphatic: Nonaneurysmal aorta. Moderate aortic atherosclerosis. No significantly enlarged lymph nodes Reproductive: Clips at the prostate.  Prostate calcifications. Other:  Negative for free air or free fluid Musculoskeletal: No acute or suspicious osseous abnormality. Degenerative changes. IMPRESSION: 1. Mild to moderate right hydronephrosis and hydroureter, secondary to what appears to be multiple adjacent small stones in the distal right ureter, spanning a craniocaudad length of ureter measuring 2 cm with stones measuring up to 3 mm in diameter. These are present at the level of mid sacrum. 2. Multiple stone fragments now visible in the right kidney as opposed to the dominant stone in the pelvis on the prior exam. 3. Increased bilateral perinephric fat stranding 4. Gallstones Electronically Signed   By: Donavan Foil M.D.   On: 06/11/2019 02:58    My personal review of EKG: Rhythm NSR   Assessment & Plan:    Active Problems:   Sepsis ([place])   1. Sepsis due to complicated UTI-patient presented with sepsis, lactic acid 4.0, acute kidney injury due to ureteral stones.  Continue cefepime.  Follow urine culture results.  Urology has been consulted for restenting today.  2. Diabetes mellitus type 2-we will start sliding scale insulin with NovoLog.  Check CBG every 6 hours.  Hold metformin.  3. Acute kidney  injury-patient's baseline creatinine is 0.86, today presenting with creatinine of 1.30.  Likely postobstructive kidney injury from renal stones.  Hopefully will improve after restenting. Urology has been consulted as above.  4. Metabolic acidosis-secondary to lactic acidosis, bicarb is 18.  Anion gap is 10.  Hopefully should improve with IV normal saline.  Follow BMP in a.m.  Repeat lactic acid is 3.0.  Continue to trend lactic acid.  5. Seizure disorder-continue Keppra  6. Hyperlipidemia-continue Lipitor    DVT Prophylaxis-   SCDs   AM Labs Ordered, also please review Full Orders  Family Communication: Admission, patients condition and plan of care including tests being ordered have been discussed with the patient who indicate understanding and agree with the plan and Code Status.  Code Status: Full code  Admission status: Inpatient: Based on patients clinical presentation and evaluation of above clinical data, I have made determination that patient meets Inpatient criteria at this time.  Time spent in minutes : 60 min   Oswald Hillock M.D on 06/11/2019 at 3:56 AM

## 2019-06-11 NOTE — ED Provider Notes (Signed)
[facility] EMERGENCY DEPARTMENT Provider Note   CSN: [id number] Arrival date & time: 06/10/19  2308    History   Chief Complaint Chief Complaint  Patient presents with  . Fever    HPI Arthur Tener. is a 63 y.o. male.     HPI  This is a 63 year old male with history of hypertension, hyperlipidemia, kidney stones with recent stent placement, diabetes who presents with fever and chills.  Patient reports that he had a ureteral stent placed last Thursday.  It was due to come out later today.  However, it fell out yesterday morning.  Since that time he has had chills and rigors and generalized weakness.  Family has noted that he has not been acting like himself.  He denies any Abdominal or flank pain.  Denies any dysuria.  Reports that when his stent fell out "something came out behind it."  He denies any chest pain, shortness of breath, cough.  No known sick contacts or COVID exposure.  Does report nausea and vomiting.  Past Medical History:  Diagnosis Date  . Anxiety    pt denies  . Coronary artery disease   . Diabetes mellitus without complication ([place])    type 2  . History of kidney stones   . Hyperlipidemia   . Hypertension   . Prostate cancer ([place]) 05-2015   radiation CC in [place]  . Stroke [facility])    left side weakness with drop foot    Patient Active Problem List   Diagnosis Date Noted  . History of stroke 12/18/2017  . Malignant neoplasm of prostate ([place]) 08/17/2015  . 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 Spastic hemiparesis affecting nondominant side ([place]) 06/11/2015  . 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 Abnormal prostate specific antigen 05/19/2015  . Essential (primary) hypertension 05/19/2015  . Acid reflux 05/19/2015  . Borderline diabetes 05/19/2015  . HLD (hyperlipidemia) 05/19/2015  . Elevated prostate specific antigen (PSA) 05/19/2015   . Acute ischemic vertebrobasilar artery brainstem stroke involving right-sided vessel ([place]) 12/02/2014  . Headache 09/09/2014  . Spondylosis of lumbar region without myelopathy or radiculopathy 08/29/2014  . Arthropathy of sacroiliac joint 08/29/2014  . Spastic hemiplegia affecting nondominant side ([place]) 05/09/2014  . Nontraumatic shoulder pain 05/09/2014  . Occlusion and stenosis of carotid artery with cerebral infarction 08/05/2013  . Convulsions ([place]) 08/05/2013  . Bicipital tenosynovitis 03/23/2012  . Left spastic hemiparesis ([place]) 03/23/2012    Past Surgical History:  Procedure Laterality Date  . CORONARY ARTERY BYPASS GRAFT     4 graft  . CYSTOSCOPY/URETEROSCOPY/HOLMIUM LASER/STENT PLACEMENT Right 06/06/2019   Procedure: CYSTOSCOPY/URETEROSCOPY/HOLMIUM LASER/STENT PLACEMENT;  Surgeon: Raynelle Bring, MD;  Location: [facility] ORS;  Service: Urology;  Laterality: Right;  . PROSTATE BIOPSY    . right hand surgery          Home Medications    Prior to Admission medications   Medication Sig Start Date End Date Taking? Authorizing Provider  acetaminophen (TYLENOL) 500 MG tablet Take by mouth every 6 (six) hours as needed (pain).     [provider]  atorvastatin (LIPITOR) 40 MG tablet Take 40 mg by mouth daily.    [provider]  HYDROcodone-acetaminophen (NORCO/VICODIN) 5-325 MG tablet Take 1-2 tablets by mouth every 6 (six) hours as needed. 06/06/19   Raynelle Bring, MD  levETIRAcetam (KEPPRA) 250 MG tablet TAKE 1 TABLET BY MOUTH TWICE DAILY 01/08/19   Leonie Man,  Lucy Antigua, MD  losartan (COZAAR) 25 MG tablet Take 25 mg by mouth daily.    [provider]  metFORMIN (GLUCOPHAGE-XR) 500 MG 24 hr tablet Take 500 mg by mouth 2 (two) times a day.  03/01/19   [provider]  nitroGLYCERIN (NITROSTAT) 0.4 MG SL tablet Place 0.4 mg under the tongue every 5 (five) minutes as needed.    [provider]  tiZANidine (ZANAFLEX) 2 MG tablet TAKE 1 TABLET BY  MOUTH AT BEDTIME AS NEEDED. Patient taking differently: Take 2 mg by mouth at bedtime as needed for muscle spasms. TAKE 1 TABLET BY MOUTH AT BEDTIME AS NEEDED. 01/09/19   Dennie Bible, NP    Family History Family History  Problem Relation Age of Onset  . Heart disease Mother   . Diabetes Mother   . Arthritis Mother   . Heart disease Father   . Diabetes Father   . Stroke Father   . Stroke Other   . Gout Other   . Coronary artery disease Other     Social History Social History   Tobacco Use  . Smoking status: Former Smoker    Packs/day: 0.25    Years: 15.00    Pack years: 3.75    Types: Cigarettes    Start date: 10/19/1970    Quit date: 10/19/1985    Years since quitting: 33.6  . Smokeless tobacco: Current User    Types: Chew  Substance Use Topics  . Alcohol use: No    Alcohol/week: 0.0 standard drinks  . Drug use: No     Allergies   Lyrica [pregabalin] and Motrin [ibuprofen]   Review of Systems Review of Systems  Constitutional: Positive for chills and fever.  Respiratory: Negative for cough and shortness of breath.   Cardiovascular: Negative for chest pain.  Gastrointestinal: Positive for nausea and vomiting. Negative for abdominal pain and diarrhea.  Genitourinary: Negative for dysuria and hematuria.  Skin: Negative for rash.  All other systems reviewed and are negative.    Physical Exam Updated Vital Signs BP 110/74   Pulse 94   Temp (!) 102 F (38.9 C) (Oral)   Resp (!) 30   Ht 1.753 m (5\' 9" )   Wt 95.3 kg   SpO2 93%   BMI 31.01 kg/m   Physical Exam Vitals signs and nursing note reviewed.  Constitutional:      Appearance: He is well-developed. He is obese. He is not toxic-appearing.  HENT:     Head: Normocephalic and atraumatic.     Mouth/Throat:     Mouth: Mucous membranes are moist.  Eyes:     Pupils: Pupils are equal, round, and reactive to light.  Neck:     Musculoskeletal: Neck supple.  Cardiovascular:     Rate and Rhythm:  Regular rhythm. Tachycardia present.     Heart sounds: Normal heart sounds. No murmur.  Pulmonary:     Effort: Pulmonary effort is normal. No respiratory distress.     Breath sounds: Normal breath sounds. No wheezing.  Abdominal:     General: Bowel sounds are normal.     Palpations: Abdomen is soft.     Tenderness: There is no abdominal tenderness. There is no guarding or rebound.  Musculoskeletal:     Right lower leg: No edema.     Left lower leg: No edema.  Lymphadenopathy:     Cervical: No cervical adenopathy.  Skin:    General: Skin is warm and dry.  Neurological:  Mental Status: He is alert and oriented to person, place, and time.  Psychiatric:        Mood and Affect: Mood normal.      ED Treatments / Results  Labs (all labs ordered are listed, but only abnormal results are displayed) Labs Reviewed  LACTIC ACID, PLASMA - Abnormal; Notable for the following components:      Result Value   Lactic Acid, Venous 4.0 (*)    All other components within normal limits  COMPREHENSIVE METABOLIC PANEL - Abnormal; Notable for the following components:   Potassium 3.4 (*)    CO2 18 (*)    Glucose, Bld 163 (*)    Creatinine, Ser 1.30 (*)    Calcium 8.3 (*)    Alkaline Phosphatase 35 (*)    GFR calc non Af Amer 58 (*)    All other components within normal limits  CBC WITH DIFFERENTIAL/PLATELET - Abnormal; Notable for the following components:   WBC 14.5 (*)    Neutro Abs 13.6 (*)    Lymphs Abs 0.3 (*)    All other components within normal limits  URINALYSIS, ROUTINE W REFLEX MICROSCOPIC - Abnormal; Notable for the following components:   Color, Urine AMBER (*)    APPearance CLOUDY (*)    Hgb urine dipstick LARGE (*)    Ketones, ur 5 (*)    Protein, ur 100 (*)    Leukocytes,Ua LARGE (*)    RBC / HPF >50 (*)    WBC, UA >50 (*)    All other components within normal limits  SARS CORONAVIRUS 2 (HOSPITAL ORDER, [place] [facility])  CULTURE, BLOOD (ROUTINE  X 2)  CULTURE, BLOOD (ROUTINE X 2)  URINE CULTURE  APTT  PROTIME-INR  LACTIC ACID, PLASMA    EKG None  Radiology Dg Abdomen 1 View  Result Date: 06/11/2019 CLINICAL DATA:  Status post right renal stent EXAM: ABDOMEN - 1 VIEW COMPARISON:  CT abdomen/pelvis dated 05/10/2019 FINDINGS: Nonobstructive bowel gas pattern. No renal stent is visualized. Fiducial markers in the region of the prostate. IMPRESSION: No renal stent is visualized. Electronically Signed   By: Julian Hy M.D.   On: 06/11/2019 00:27   Dg Chest Portable 1 View  Result Date: 06/11/2019 CLINICAL DATA:  Fever, body aches, altered mental status EXAM: PORTABLE CHEST 1 VIEW COMPARISON:  None. FINDINGS: Low lung volumes with bibasilar atelectasis. No focal consolidation. No pleural effusion or pneumothorax. The heart is normal in size. Postsurgical changes related to prior CABG. Median sternotomy. IMPRESSION: Low lung volumes with bibasilar atelectasis. Electronically Signed   By: Julian Hy M.D.   On: 06/11/2019 01:39   Ct Renal Stone Study  Result Date: 06/11/2019 CLINICAL DATA:  Stent placed recently, altered mental status EXAM: CT ABDOMEN AND PELVIS WITHOUT CONTRAST TECHNIQUE: Multidetector CT imaging of the abdomen and pelvis was performed following the standard protocol without IV contrast. COMPARISON:  Radiograph 06/10/2019, CT 05/10/2019 FINDINGS: Lower chest: Lung bases demonstrate no pleural effusion. Partial atelectasis at the bilateral lung bases. Coronary vascular calcification. Ectatic aortic root measuring up to 3.8 cm. Small hiatal hernia. Hepatobiliary: Small calcified gallstones. No focal hepatic abnormality. No biliary dilatation Pancreas: Unremarkable. No pancreatic ductal dilatation or surrounding inflammatory changes. Spleen: Normal in size without focal abnormality. Adrenals/Urinary Tract: Adrenal glands are normal. Moderate bilateral perinephric fat stranding, increased compared to prior. Numerous  stone fragments within the right kidney measuring up to 6 mm, previously noted dominant right renal pelvis stone no longer identified.  Mild to moderate right hydronephrosis and hydroureter, secondary to multiple small stones in the distal right ureter ovary 2 cm segment of ureter. Stones measure up to 3 mm in diameter. These are visualized at the mid sacral level. Urinary bladder unremarkable Stomach/Bowel: Stomach is within normal limits. Appendix appears normal. No evidence of bowel wall thickening, distention, or inflammatory changes. Vascular/Lymphatic: Nonaneurysmal aorta. Moderate aortic atherosclerosis. No significantly enlarged lymph nodes Reproductive: Clips at the prostate.  Prostate calcifications. Other: Negative for free air or free fluid Musculoskeletal: No acute or suspicious osseous abnormality. Degenerative changes. IMPRESSION: 1. Mild to moderate right hydronephrosis and hydroureter, secondary to what appears to be multiple adjacent small stones in the distal right ureter, spanning a craniocaudad length of ureter measuring 2 cm with stones measuring up to 3 mm in diameter. These are present at the level of mid sacrum. 2. Multiple stone fragments now visible in the right kidney as opposed to the dominant stone in the pelvis on the prior exam. 3. Increased bilateral perinephric fat stranding 4. Gallstones Electronically Signed   By: Donavan Foil M.D.   On: 06/11/2019 02:58    Procedures Procedures (including critical care time)  CRITICAL CARE Performed by: Merryl Hacker   Total critical care time: 50 minutes  Critical care time was exclusive of separately billable procedures and treating other patients.  Critical care was necessary to treat or prevent imminent or life-threatening deterioration.  Critical care was time spent personally by me on the following activities: development of treatment plan with patient and/or surrogate as well as nursing, discussions with consultants,  evaluation of patient's response to treatment, examination of patient, obtaining history from patient or surrogate, ordering and performing treatments and interventions, ordering and review of laboratory studies, ordering and review of radiographic studies, pulse oximetry and re-evaluation of patient's condition.   Medications Ordered in ED Medications  sodium chloride 0.9 % bolus 1,000 mL (0 mLs Intravenous Stopped 06/11/19 0235)    And  sodium chloride 0.9 % bolus 1,000 mL (0 mLs Intravenous Stopped 06/11/19 0235)    And  sodium chloride 0.9 % bolus 1,000 mL (0 mLs Intravenous Stopped 06/11/19 0150)  ceFEPIme (MAXIPIME) 2 g in sodium chloride 0.9 % 100 mL IVPB (0 g Intravenous Stopped 06/11/19 0139)     Initial Impression / Assessment and Plan / ED Course  I have reviewed the triage vital signs and the nursing notes.  Pertinent labs & imaging results that were available during my care of the patient were reviewed by me and considered in my medical decision making (see chart for details).  Clinical Course as of Jun 10 300  Tue Jun 11, 2019  0144 Spoke with Dr. Gloriann Loan Urology.  Recommends CT stone study to r/o obstruction   [CH]  0300 CT resulted.  Plan for stenting by Dr. Gloriann Loan later today.  Plan for admission to the hospitalist for urosepsis.   [CH]    Clinical Course User Index [CH] Jamerius Boeckman, Barbette Hair, MD       Patient presents with a fever.  This is in the setting of recent kidney stone and stent placement.  Stent fell out yesterday.  He is febrile and tachycardic.  He is nontoxic-appearing on exam.  No hypotension.  Sepsis work-up was initiated.  Patient was given cefepime for presumed urinary source.  He was given 30 cc/kg fluid.  Lactate was 4.0.  He has a leukocytosis and urine appears dirty.  Urine cultures pending.  COVID testing is negative.  Discussed with urology.  Recommend repeat CT scan to evaluate for residual obstructing stone.  CT shows multiple stones in the right  ureter.  Urology updated.  Plan for stenting.  Agrees with antibiotics and coverage for sepsis likely related to infected stone.    Final Clinical Impressions(s) / ED Diagnoses   Final diagnoses:  Severe sepsis ([place])  Complicated UTI (urinary tract infection)  AKI (acute kidney injury) [facility])    ED Discharge Orders    None       Dina Rich, Barbette Hair, MD 06/11/19 [phone]

## 2019-06-11 NOTE — Anesthesia Procedure Notes (Signed)
Procedure Name: Intubation Date/Time: 06/11/2019 4:57 AM Performed by: Lenice Llamas, MD Pre-anesthesia Checklist: Patient identified, Patient being monitored, Timeout performed, Emergency Drugs available and Suction available Patient Re-evaluated:Patient Re-evaluated prior to induction Oxygen Delivery Method: Circle System Utilized Preoxygenation: Pre-oxygenation with 100% oxygen Induction Type: IV induction and Rapid sequence Ventilation: Mask ventilation without difficulty Laryngoscope Size: Mac and 4 Grade View: Grade I Tube type: Oral Tube size: 7.0 mm Number of attempts: 1 Airway Equipment and Method: Stylet Placement Confirmation: ETT inserted through vocal cords under direct vision,  positive ETCO2 and breath sounds checked- equal and bilateral Secured at: 22 cm Tube secured with: Tape Dental Injury: Teeth and Oropharynx as per pre-operative assessment

## 2019-06-11 NOTE — Op Note (Signed)
Operative Note  Preoperative diagnosis:  1.  Right ureteral calculus/Steinstrasse with concern for sepsis secondary to obstructing calculus  Post operative diagnosis: Same  Procedure(s): 1.  Cystoscopy with right retrograde pyelogram and right ureteral stent placement  Surgeon: Link Snuffer, MD  Assistants: None  Anesthesia: General  Complications: None immediate  EBL: Minimal  Specimens: 1.  None  Drains/Catheters: 1.  6 X 26 double-J ureteral stent 2.  Foley catheter  Intraoperative findings: 1.  Normal urethra and bladder 2.  Right retrograde pyelogram revealed a filling defect at the level of the stone with upstream hydroureteronephrosis  Indication: 63 year old male patient of Dr. Alinda Money.  He has a history of locally advanced prostate cancer treated with radiation and androgen deprivation therapy.  He has now been off that for over 1 year.  Most recently, he was seen and was found to have an approximately 12 to 15 mm ureteropelvic junction calculus.  For this reason, he underwent a cystoscopy with right ureteroscopy with laser lithotripsy and ureteral stent placement by Dr. Alinda Money on 06/06/2019.  Yesterday, the patient was voiding and his stent came out.  Shortly thereafter, he developed severe right-sided flank pain.  He also developed fever and altered mental status.  For that reason he was brought to the emergency department where he was found to be febrile, tachycardic, and intermittently hypotensive.  Clinically he reportedly improved with fluid resuscitation.  He is received a dose of cefepime.  Creatinine is up a small amount to 1.3 from baseline of about 0.6.  He has mild leukocytosis with a white blood cell count of 14.5.  Lactate was 4 upon arrival.  Urinalysis revealed large leukocyte, negative nitrite, no bacteria seen, greater than 50 WBC.  Urine culture and blood culture pending.  CT scan of the abdomen and pelvis without contrast revealed moderate hydronephrosis  secondary to multiple stacked ureteral calculi 2 to 3 mm in diameter.  He also had some nonobstructing right renal calculi.  He also had bilateral perinephric fat stranding.  Description of procedure:  The patient was identified and consent was obtained.  The patient was taken to the operating room and placed in the supine position.  The patient was placed under general anesthesia.  Perioperative antibiotics were administered.  The patient was placed in dorsal lithotomy.  Patient was prepped and draped in a standard sterile fashion and a timeout was performed.  A 21 French rigid cystoscope was advanced into the urethra and into the bladder.  The right distal most portion of the ureter was cannulated with an open-ended ureteral catheter.  Retrograde pyelogram was performed with the findings noted above.  A sensor wire was then advanced up to the kidney under fluoroscopic guidance.  A 6 X 26 double-J ureteral stent was advanced up to the kidney under fluoroscopic guidance.  The wire was withdrawn and fluoroscopy confirmed good proximal placement and direct visualization confirmed a good coil within the bladder.  The scope was withdrawn and an 16 French urethral catheter was placed.  This concluded the operation.  Patient tolerated procedure well and was stable postoperatively.  Plan: Admit to the hospitalist service.  Continue IV antibiotics until cultures return.  Can likely discontinue the Foley catheter in the morning.

## 2019-06-11 NOTE — Progress Notes (Signed)
Day of Surgery Subjective: Patient reports feeling OK, no mention of pain.   Objective: Vital signs in last 24 hours: Temp:  [98.1 F (36.7 C)-102 F (38.9 C)] 98.6 F (37 C) (07/28 1140) Pulse Rate:  [89-123] 102 (07/28 0730) Resp:  [19-35] 23 (07/28 0730) BP: (97-143)/(57-102) 120/79 (07/28 0730) SpO2:  [86 %-97 %] 94 % (07/28 0730) FiO2 (%):  [40 %] 40 % (07/28 0700) Weight:  [95.3 kg] 95.3 kg (07/27 2311)  Intake/Output from previous day: No intake/output data recorded. Intake/Output this shift: Total I/O In: -  Out: 400 [Urine:400]  Physical Exam:  Constitutional: Vital signs reviewed. WD WN in NAD   Eyes: PERRL, No scleral icterus.   Cardiovascular: Nml rate Pulmonary/Chest: Normal effort   Lab Results: Recent Labs    06/11/19 0003 06/11/19 0738  HGB 15.2 14.1  HCT 46.9 44.7   BMET Recent Labs    06/11/19 0003 06/11/19 0738  NA 136 140  K 3.4* 4.0  CL 108 108  CO2 18* 20*  GLUCOSE 163* 163*  BUN 18 18  CREATININE 1.30* 1.41*  CALCIUM 8.3* 7.7*   Recent Labs    06/11/19 0003  INR 1.2   No results for input(s): LABURIN in the last 72 hours. Results for orders placed or performed during the hospital encounter of 06/10/19  SARS Coronavirus 2 (CEPHEID - Performed in [place] hospital lab), Hosp Order     Status: None   Collection Time: 06/10/19 11:59 PM   Specimen: Nasopharyngeal Swab  Result Value Ref Range Status   SARS Coronavirus 2 NEGATIVE NEGATIVE Final    Comment: (NOTE) If result is NEGATIVE SARS-CoV-2 target nucleic acids are NOT DETECTED. The SARS-CoV-2 RNA is generally detectable in upper and lower  respiratory specimens during the acute phase of infection. The lowest  concentration of SARS-CoV-2 viral copies this assay can detect is 250  copies / mL. A negative result does not preclude SARS-CoV-2 infection  and should not be used as the sole basis for treatment or other  patient management decisions.  A negative result may occur  with  improper specimen collection / handling, submission of specimen other  than nasopharyngeal swab, presence of viral mutation(s) within the  areas targeted by this assay, and inadequate number of viral copies  (<250 copies / mL). A negative result must be combined with clinical  observations, patient history, and epidemiological information. If result is POSITIVE SARS-CoV-2 target nucleic acids are DETECTED. The SARS-CoV-2 RNA is generally detectable in upper and lower  respiratory specimens dur ing the acute phase of infection.  Positive  results are indicative of active infection with SARS-CoV-2.  Clinical  correlation with patient history and other diagnostic information is  necessary to determine patient infection status.  Positive results do  not rule out bacterial infection or co-infection with other viruses. If result is PRESUMPTIVE POSTIVE SARS-CoV-2 nucleic acids MAY BE PRESENT.   A presumptive positive result was obtained on the submitted specimen  and confirmed on repeat testing.  While 2019 novel coronavirus  (SARS-CoV-2) nucleic acids may be present in the submitted sample  additional confirmatory testing may be necessary for epidemiological  and / or clinical management purposes  to differentiate between  SARS-CoV-2 and other Sarbecovirus currently known to infect humans.  If clinically indicated additional testing with an alternate test  methodology [phone]) is advised. The SARS-CoV-2 RNA is generally  detectable in upper and lower respiratory sp ecimens during the acute  phase of infection. The  expected result is Negative. Fact Sheet for Patients:  StrictlyIdeas.no Fact Sheet for Healthcare Providers: BankingDealers.co.za This test is not yet approved or cleared by the [place] FDA and has been authorized for detection and/or diagnosis of SARS-CoV-2 by FDA under an Emergency Use Authorization (EUA).  This EUA  will remain in effect (meaning this test can be used) for the duration of the COVID-19 declaration under Section 564(b)(1) of the Act, 21 U.S.C. section 360bbb-3(b)(1), unless the authorization is terminated or revoked sooner. Performed at [facility], [address], [place], [place] [address]   Blood Culture (routine x 2)     Status: None (Preliminary result)   Collection Time: 06/11/19 12:03 AM   Specimen: BLOOD LEFT HAND  Result Value Ref Range Status   Specimen Description BLOOD LEFT HAND  Final   Special Requests   Final    BOTTLES DRAWN AEROBIC AND ANAEROBIC Blood Culture adequate volume   Culture   Final    NO GROWTH < 12 HOURS Performed at [facility], [address], [place], [place] [address]    Report Status PENDING  Incomplete  Blood Culture (routine x 2)     Status: None (Preliminary result)   Collection Time: 06/11/19 12:06 AM   Specimen: BLOOD  Result Value Ref Range Status   Specimen Description BLOOD RIGHT ANTECUBITAL  Final   Special Requests   Final    BOTTLES DRAWN AEROBIC AND ANAEROBIC Blood Culture results may not be optimal due to an inadequate volume of blood received in culture bottles   Culture   Final    NO GROWTH < 12 HOURS Performed at [facility], [address], [place], [place] [address]    Report Status PENDING  Incomplete    Studies/Results: Dg Abdomen 1 View  Result Date: 06/11/2019 CLINICAL DATA:  Status post right renal stent EXAM: ABDOMEN - 1 VIEW COMPARISON:  CT abdomen/pelvis dated 05/10/2019 FINDINGS: Nonobstructive bowel gas pattern. No renal stent is visualized. Fiducial markers in the region of the prostate. IMPRESSION: No renal stent is visualized. Electronically Signed   By: Julian Hy M.D.   On: 06/11/2019 00:27   Dg Retrograde Pyelogram  Result Date: 06/11/2019 CLINICAL DATA:  Renal calculus EXAM: RETROGRADE PYELOGRAM COMPARISON:  CT from the same day FINDINGS: A series of fluoroscopic spot images documents unilateral  retrograde pyelography with placement of ureteral stent. Incomplete opacification of the proximal ureter. No definite urothelial lesion identified. Laterality not indicated on images. IMPRESSION: Retrograde pyelogram and ureteral stent placement. Electronically Signed   By: Lucrezia Europe M.D.   On: 06/11/2019 10:39   Dg Chest Portable 1 View  Result Date: 06/11/2019 CLINICAL DATA:  Fever, body aches, altered mental status EXAM: PORTABLE CHEST 1 VIEW COMPARISON:  None. FINDINGS: Low lung volumes with bibasilar atelectasis. No focal consolidation. No pleural effusion or pneumothorax. The heart is normal in size. Postsurgical changes related to prior CABG. Median sternotomy. IMPRESSION: Low lung volumes with bibasilar atelectasis. Electronically Signed   By: Julian Hy M.D.   On: 06/11/2019 01:39   Ct Renal Stone Study  Result Date: 06/11/2019 CLINICAL DATA:  Stent placed recently, altered mental status EXAM: CT ABDOMEN AND PELVIS WITHOUT CONTRAST TECHNIQUE: Multidetector CT imaging of the abdomen and pelvis was performed following the standard protocol without IV contrast. COMPARISON:  Radiograph 06/10/2019, CT 05/10/2019 FINDINGS: Lower chest: Lung bases demonstrate no pleural effusion. Partial atelectasis at the bilateral lung bases. Coronary vascular calcification. Ectatic aortic root measuring up to 3.8 cm.  Small hiatal hernia. Hepatobiliary: Small calcified gallstones. No focal hepatic abnormality. No biliary dilatation Pancreas: Unremarkable. No pancreatic ductal dilatation or surrounding inflammatory changes. Spleen: Normal in size without focal abnormality. Adrenals/Urinary Tract: Adrenal glands are normal. Moderate bilateral perinephric fat stranding, increased compared to prior. Numerous stone fragments within the right kidney measuring up to 6 mm, previously noted dominant right renal pelvis stone no longer identified. Mild to moderate right hydronephrosis and hydroureter, secondary to multiple  small stones in the distal right ureter ovary 2 cm segment of ureter. Stones measure up to 3 mm in diameter. These are visualized at the mid sacral level. Urinary bladder unremarkable Stomach/Bowel: Stomach is within normal limits. Appendix appears normal. No evidence of bowel wall thickening, distention, or inflammatory changes. Vascular/Lymphatic: Nonaneurysmal aorta. Moderate aortic atherosclerosis. No significantly enlarged lymph nodes Reproductive: Clips at the prostate.  Prostate calcifications. Other: Negative for free air or free fluid Musculoskeletal: No acute or suspicious osseous abnormality. Degenerative changes. IMPRESSION: 1. Mild to moderate right hydronephrosis and hydroureter, secondary to what appears to be multiple adjacent small stones in the distal right ureter, spanning a craniocaudad length of ureter measuring 2 cm with stones measuring up to 3 mm in diameter. These are present at the level of mid sacrum. 2. Multiple stone fragments now visible in the right kidney as opposed to the dominant stone in the pelvis on the prior exam. 3. Increased bilateral perinephric fat stranding 4. Gallstones Electronically Signed   By: Donavan Foil M.D.   On: 06/11/2019 02:58    Assessment/Plan:   Postop Rt ureteral stent replacement w/ probable sepsis--HD stable  Continue supportive care/Abx coverage  We will arrange proper followup following hospital care   LOS: 0 days   Lillette Boxer Sundi Slevin 06/11/2019, 11:59 AM

## 2019-06-11 NOTE — ED Notes (Signed)
CRITICAL VALUE ALERT  Critical Value:  Lactic acid 3.0  Date & Time Notied:  06/11/2019 0406  Provider Notified: dr. Darrick Meigs  Orders Received/Actions taken: see chart

## 2019-06-11 NOTE — Consult Note (Signed)
H&P Physician requesting consult: Thayer Jew, MD  Chief Complaint: Right flank pain status post ureteral stent removal with concern for sepsis  History of Present Illness: 63 year old male patient of Dr. Alinda Money.  He has a history of locally advanced prostate cancer treated with radiation and androgen deprivation therapy.  He has now been off that for over 1 year.  Most recently, he was seen and was found to have an approximately 12 to 15 mm ureteropelvic junction calculus.  For this reason, he underwent a cystoscopy with right ureteroscopy with laser lithotripsy and ureteral stent placement by Dr. Alinda Money on 06/06/2019.  Yesterday, the patient was voiding and his stent came out.  Shortly thereafter, he developed severe right-sided flank pain.  He also developed fever and altered mental status.  For that reason he was brought to the emergency department where he was found to be febrile, tachycardic, and intermittently hypotensive.  Clinically he reportedly improved with fluid resuscitation.  He is received a dose of cefepime.  Creatinine is up a small amount to 1.3 from baseline of about 0.6.  He has mild leukocytosis with a white blood cell count of 14.5.  Lactate was 4 upon arrival.  Urinalysis revealed large leukocyte, negative nitrite, no bacteria seen, greater than 50 WBC.  Urine culture and blood culture pending.  CT scan of the abdomen and pelvis without contrast revealed moderate hydronephrosis secondary to multiple stacked ureteral calculi 2 to 3 mm in diameter.  He also had some nonobstructing right renal calculi.  He also had bilateral perinephric fat stranding.  Past Medical History:  Diagnosis Date  . Anxiety    pt denies  . Coronary artery disease   . Diabetes mellitus without complication ([place])    type 2  . History of kidney stones   . Hyperlipidemia   . Hypertension   . Prostate cancer ([place]) 05-2015   radiation CC in [place]  . Stroke [facility])    left side weakness with drop foot    Past Surgical History:  Procedure Laterality Date  . CORONARY ARTERY BYPASS GRAFT     4 graft  . CYSTOSCOPY/URETEROSCOPY/HOLMIUM LASER/STENT PLACEMENT Right 06/06/2019   Procedure: CYSTOSCOPY/URETEROSCOPY/HOLMIUM LASER/STENT PLACEMENT;  Surgeon: Raynelle Bring, MD;  Location: [facility] ORS;  Service: Urology;  Laterality: Right;  . PROSTATE BIOPSY    . right hand surgery      Home Medications:  (Not in a hospital admission)  Allergies:  Allergies  Allergen Reactions  . Lyrica [Pregabalin]     hallucination  . Motrin [Ibuprofen]     "dr said I can not take it."    Family History  Problem Relation Age of Onset  . Heart disease Mother   . Diabetes Mother   . Arthritis Mother   . Heart disease Father   . Diabetes Father   . Stroke Father   . Stroke Other   . Gout Other   . Coronary artery disease Other    Social History:  reports that he quit smoking about 33 years ago. His smoking use included cigarettes. He started smoking about 48 years ago. He has a 3.75 pack-year smoking history. His smokeless tobacco use includes chew. He reports that he does not drink alcohol or use drugs.  ROS: A complete review of systems was performed.  All systems are negative except for pertinent findings as noted. ROS   Physical Exam:  Vital signs in last 24 hours: Temp:  [98.9 F (37.2 C)-102 F (38.9 C)] 98.9 F (37.2 C) (  07/28 0309) Pulse Rate:  [89-105] 89 (07/28 0300) Resp:  [19-30] 30 (07/28 0230) BP: (97-114)/(57-82) 97/57 (07/28 0300) SpO2:  [92 %-97 %] 97 % (07/28 0300) Weight:  [95.3 kg] 95.3 kg (07/27 2311) General: Drowsy but oriented, No acute distress HEENT: Normocephalic, atraumatic Neck: No JVD or lymphadenopathy Cardiovascular: Regular rate and rhythm Lungs: Regular rate and effort Abdomen: Soft, nontender, nondistended, no abdominal masses Back: Mild right CVA tenderness Extremities: No edema Neurologic: Grossly intact  Laboratory Data:  Results for orders placed  or performed during the hospital encounter of 06/10/19 (from the past 24 hour(s))  SARS Coronavirus 2 (CEPHEID - Performed in [place] hospital lab), [facility] Order     Status: None   Collection Time: 06/10/19 11:59 PM   Specimen: Nasopharyngeal Swab  Result Value Ref Range   SARS Coronavirus 2 NEGATIVE NEGATIVE  Lactic acid, plasma     Status: Abnormal   Collection Time: 06/11/19 12:03 AM  Result Value Ref Range   Lactic Acid, Venous 4.0 (HH) 0.5 - 1.9 mmol/L  Comprehensive metabolic panel     Status: Abnormal   Collection Time: 06/11/19 12:03 AM  Result Value Ref Range   Sodium 136 135 - 145 mmol/L   Potassium 3.4 (L) 3.5 - 5.1 mmol/L   Chloride 108 98 - 111 mmol/L   CO2 18 (L) 22 - 32 mmol/L   Glucose, Bld 163 (H) 70 - 99 mg/dL   BUN 18 8 - 23 mg/dL   Creatinine, Ser 1.30 (H) 0.61 - 1.24 mg/dL   Calcium 8.3 (L) 8.9 - 10.3 mg/dL   Total Protein 6.7 6.5 - 8.1 g/dL   Albumin 3.6 3.5 - 5.0 g/dL   AST 21 15 - 41 U/L   ALT 15 0 - 44 U/L   Alkaline Phosphatase 35 (L) 38 - 126 U/L   Total Bilirubin 1.0 0.3 - 1.2 mg/dL   GFR calc non Af Amer 58 (L) >60 mL/min   GFR calc Af Amer >60 >60 mL/min   Anion gap 10 5 - 15  CBC WITH DIFFERENTIAL     Status: Abnormal   Collection Time: 06/11/19 12:03 AM  Result Value Ref Range   WBC 14.5 (H) 4.0 - 10.5 K/uL   RBC 5.10 4.22 - 5.81 MIL/uL   Hemoglobin 15.2 13.0 - 17.0 g/dL   HCT 46.9 39.0 - 52.0 %   MCV 92.0 80.0 - 100.0 fL   MCH 29.8 26.0 - 34.0 pg   MCHC 32.4 30.0 - 36.0 g/dL   RDW 13.3 11.5 - 15.5 %   Platelets 182 150 - 400 K/uL   nRBC 0.0 0.0 - 0.2 %   Neutrophils Relative % 95 %   Neutro Abs 13.6 (H) 1.7 - 7.7 K/uL   Lymphocytes Relative 2 %   Lymphs Abs 0.3 (L) 0.7 - 4.0 K/uL   Monocytes Relative 3 %   Monocytes Absolute 0.4 0.1 - 1.0 K/uL   Eosinophils Relative 0 %   Eosinophils Absolute 0.0 0.0 - 0.5 K/uL   Basophils Relative 0 %   Basophils Absolute 0.0 0.0 - 0.1 K/uL   Immature Granulocytes 0 %   Abs Immature  Granulocytes 0.05 0.00 - 0.07 K/uL  APTT     Status: None   Collection Time: 06/11/19 12:03 AM  Result Value Ref Range   aPTT 25 24 - 36 seconds  Protime-INR     Status: None   Collection Time: 06/11/19 12:03 AM  Result Value Ref Range  Prothrombin Time 14.6 11.4 - 15.2 seconds   INR 1.2 0.8 - 1.2  Urinalysis, Routine w reflex microscopic     Status: Abnormal   Collection Time: 06/11/19 12:39 AM  Result Value Ref Range   Color, Urine AMBER (A) YELLOW   APPearance CLOUDY (A) CLEAR   Specific Gravity, Urine 1.023 1.005 - 1.030   pH 5.0 5.0 - 8.0   Glucose, UA NEGATIVE NEGATIVE mg/dL   Hgb urine dipstick LARGE (A) NEGATIVE   Bilirubin Urine NEGATIVE NEGATIVE   Ketones, ur 5 (A) NEGATIVE mg/dL   Protein, ur 100 (A) NEGATIVE mg/dL   Nitrite NEGATIVE NEGATIVE   Leukocytes,Ua LARGE (A) NEGATIVE   RBC / HPF >50 (H) 0 - 5 RBC/hpf   WBC, UA >50 (H) 0 - 5 WBC/hpf   Bacteria, UA NONE SEEN NONE SEEN   WBC Clumps PRESENT    Mucus PRESENT    Recent Results (from the past 240 hour(s))  SARS Coronavirus 2 (Performed in [place] hospital lab)     Status: None   Collection Time: 06/03/19  2:38 PM   Specimen: Nasal Swab  Result Value Ref Range Status   SARS Coronavirus 2 NEGATIVE NEGATIVE Final    Comment: (NOTE) SARS-CoV-2 target nucleic acids are NOT DETECTED. The SARS-CoV-2 RNA is generally detectable in upper and lower respiratory specimens during the acute phase of infection. Negative results do not preclude SARS-CoV-2 infection, do not rule out co-infections with other pathogens, and should not be used as the sole basis for treatment or other patient management decisions. Negative results must be combined with clinical observations, patient history, and epidemiological information. The expected result is Negative. Fact Sheet for Patients: SugarRoll.be Fact Sheet for Healthcare Providers: https://www.woods-mathews.com/ This test is not  yet approved or cleared by the [place] FDA and  has been authorized for detection and/or diagnosis of SARS-CoV-2 by FDA under an Emergency Use Authorization (EUA). This EUA will remain  in effect (meaning this test can be used) for the duration of the COVID-19 declaration under Section 56 4(b)(1) of the Act, 21 U.S.C. section 360bbb-3(b)(1), unless the authorization is terminated or revoked sooner. Performed at [facility], [place] [address], [place], [place] [address]   SARS Coronavirus 2 (CEPHEID - Performed in [place] hospital lab), Hosp Order     Status: None   Collection Time: 06/10/19 11:59 PM   Specimen: Nasopharyngeal Swab  Result Value Ref Range Status   SARS Coronavirus 2 NEGATIVE NEGATIVE Final    Comment: (NOTE) If result is NEGATIVE SARS-CoV-2 target nucleic acids are NOT DETECTED. The SARS-CoV-2 RNA is generally detectable in upper and lower  respiratory specimens during the acute phase of infection. The lowest  concentration of SARS-CoV-2 viral copies this assay can detect is 250  copies / mL. A negative result does not preclude SARS-CoV-2 infection  and should not be used as the sole basis for treatment or other  patient management decisions.  A negative result may occur with  improper specimen collection / handling, submission of specimen other  than nasopharyngeal swab, presence of viral mutation(s) within the  areas targeted by this assay, and inadequate number of viral copies  (<250 copies / mL). A negative result must be combined with clinical  observations, patient history, and epidemiological information. If result is POSITIVE SARS-CoV-2 target nucleic acids are DETECTED. The SARS-CoV-2 RNA is generally detectable in upper and lower  respiratory specimens dur ing the acute phase of infection.  Positive  results are indicative of active infection with SARS-CoV-2.  Clinical  correlation with patient history and other diagnostic information is   necessary to determine patient infection status.  Positive results do  not rule out bacterial infection or co-infection with other viruses. If result is PRESUMPTIVE POSTIVE SARS-CoV-2 nucleic acids MAY BE PRESENT.   A presumptive positive result was obtained on the submitted specimen  and confirmed on repeat testing.  While 2019 novel coronavirus  (SARS-CoV-2) nucleic acids may be present in the submitted sample  additional confirmatory testing may be necessary for epidemiological  and / or clinical management purposes  to differentiate between  SARS-CoV-2 and other Sarbecovirus currently known to infect humans.  If clinically indicated additional testing with an alternate test  methodology [phone]) is advised. The SARS-CoV-2 RNA is generally  detectable in upper and lower respiratory sp ecimens during the acute  phase of infection. The expected result is Negative. Fact Sheet for Patients:  StrictlyIdeas.no Fact Sheet for Healthcare Providers: BankingDealers.co.za This test is not yet approved or cleared by the [place] FDA and has been authorized for detection and/or diagnosis of SARS-CoV-2 by FDA under an Emergency Use Authorization (EUA).  This EUA will remain in effect (meaning this test can be used) for the duration of the COVID-19 declaration under Section 564(b)(1) of the Act, 21 U.S.C. section 360bbb-3(b)(1), unless the authorization is terminated or revoked sooner. Performed at [facility], [address] [place], [place] [address]    Creatinine: Recent Labs    06/11/19 0003  CREATININE 1.30*   CT scan personally reviewed and is detailed in the history of present illness  Impression/Assessment:  Right renal and ureteral calculi Right hydronephrosis secondary to obstructing calculus Sepsis likely secondary to urinary tract infection  Plan:  I discussed different management options with the patient including  nephrostomy tube placement versus ureteral stent placement.  He was greatly opposed to nephrostomy tube placement.  He would prefer to proceed with ureteral stent placement for decompression.  He understands potential for bleeding, infection, injury to surrounding structures, need for additional procedures.  I have asked for the hospitalist to be consulted for admission for management of UTI/sepsis.  Agree with broad-spectrum antibiotics until cultures have returned.  I will likely leave a catheter after the procedure.  Marton Redwood, III 06/11/2019, 3:50 AM

## 2019-06-11 NOTE — Transfer of Care (Signed)
Immediate Anesthesia Transfer of Care Note  Patient: Arthur Cisneros.  Procedure(s) Performed: CYSTOSCOPY WITH STENT PLACEMENT (Right )  Patient Location: PACU  Anesthesia Type:General  Level of Consciousness: awake  Airway & Oxygen Therapy: Patient Spontanous Breathing  Post-op Assessment: Report given to RN  Post vital signs: Reviewed and stable  Last Vitals:  Vitals Value Taken Time  BP 113/90 06/11/19 0532  Temp    Pulse 106 06/11/19 0541  Resp 33 06/11/19 0541  SpO2 94 % 06/11/19 0541  Vitals shown include unvalidated device data.  Last Pain:  Vitals:   06/11/19 0410  TempSrc: Oral  PainSc: 6          Complications: No apparent anesthesia complications

## 2019-06-11 NOTE — Anesthesia Postprocedure Evaluation (Signed)
Anesthesia Post Note  Patient: Arthur Cisneros.  Procedure(s) Performed: CYSTOSCOPY WITH STENT PLACEMENT (Right )  Anesthesia Type: General Pain management: pain level controlled Vital Signs Assessment: post-procedure vital signs reviewed and stable Respiratory status: spontaneous breathing and patient connected to nasal cannula oxygen Cardiovascular status: blood pressure returned to baseline and stable Postop Assessment: no headache Anesthetic complications: no     Last Vitals:  Vitals:   06/11/19 0715 06/11/19 0730  BP: 116/73 120/79  Pulse: (!) 103 (!) 102  Resp: (!) 27 (!) 23  Temp:    SpO2: 93% 94%    Last Pain:  Vitals:   06/11/19 0730  TempSrc:   PainSc: 8                  Thien Berka S Rashaud Ybarbo

## 2019-06-11 NOTE — ED Notes (Signed)
ED TO INPATIENT HANDOFF REPORT  ED Nurse Name and Phone #: [phone]  S Name/Age/Gender Arthur Cisneros. 63 y.o. male Room/Bed: APOTF/OTF  Code Status   Code Status: Not on file  Home/SNF/Other Home Patient oriented to: situation Is this baseline? Yes   Triage Complete: Triage complete  Chief Complaint AMS  Triage Note Pt had stent placed in kidney on Thursday of last week. Pt started having altered mental status, fever, and body aches that started today. Pt was given 1000mg  tylenol en route.    Allergies Allergies  Allergen Reactions  . Lyrica [Pregabalin]     hallucination  . Motrin [Ibuprofen]     "dr said I can not take it."    Level of Care/Admitting Diagnosis ED Disposition    ED Disposition Condition [place]: [facility] [[id number]]  Level of Care: Stepdown [14]  Covid Evaluation: Confirmed COVID Negative  Diagnosis: Sepsis [facility]) [[id number]]  Admitting Physician: Oswald Hillock [[id number]]  Attending Physician: Oswald Hillock [[id number]]  Estimated length of stay: 3 - 4 days  Certification:: I certify this patient will need inpatient services for at least 2 midnights  PT Class (Do Not Modify): Inpatient [101]  PT Acc Code (Do Not Modify): Private [1]       B Medical/Surgery History Past Medical History:  Diagnosis Date  . Anxiety    pt denies  . Coronary artery disease   . Diabetes mellitus without complication ([place])    type 2  . History of kidney stones   . Hyperlipidemia   . Hypertension   . Prostate cancer ([place]) 05-2015   radiation CC in [place]  . Stroke [facility])    left side weakness with drop foot   Past Surgical History:  Procedure Laterality Date  . CORONARY ARTERY BYPASS GRAFT     4 graft  . CYSTOSCOPY/URETEROSCOPY/HOLMIUM LASER/STENT PLACEMENT Right 06/06/2019   Procedure: CYSTOSCOPY/URETEROSCOPY/HOLMIUM LASER/STENT PLACEMENT;  Surgeon: Raynelle Bring, MD;  Location: [facility];  Service: Urology;  Laterality: Right;  .  PROSTATE BIOPSY    . right hand surgery       A IV Location/Drains/Wounds Patient Lines/Drains/Airways Status   Active Line/Drains/Airways    Name:   Placement date:   Placement time:   Site:   Days:   Peripheral IV 06/11/19 Right Wrist   06/11/19    0011    Wrist   less than 1   Peripheral IV 06/11/19 Left Hand   06/11/19    0108    Hand   less than 1   Ureteral Drain/Stent Right ureter 6 Fr.   06/06/19    1642    Right ureter   5          Intake/Output Last 24 hours No intake or output data in the 24 hours ending 06/11/19 0357  Labs/Imaging Results for orders placed or performed during the hospital encounter of 06/10/19 (from the past 48 hour(s))  SARS Coronavirus 2 (CEPHEID - Performed in [place] hospital lab), Hosp Order     Status: None   Collection Time: 06/10/19 11:59 PM   Specimen: Nasopharyngeal Swab  Result Value Ref Range   SARS Coronavirus 2 NEGATIVE NEGATIVE    Comment: (NOTE) If result is NEGATIVE SARS-CoV-2 target nucleic acids are NOT DETECTED. The SARS-CoV-2 RNA is generally detectable in upper and lower  respiratory specimens during the acute phase of infection. The lowest  concentration of SARS-CoV-2 viral copies this assay can detect  is 250  copies / mL. A negative result does not preclude SARS-CoV-2 infection  and should not be used as the sole basis for treatment or other  patient management decisions.  A negative result may occur with  improper specimen collection / handling, submission of specimen other  than nasopharyngeal swab, presence of viral mutation(s) within the  areas targeted by this assay, and inadequate number of viral copies  (<250 copies / mL). A negative result must be combined with clinical  observations, patient history, and epidemiological information. If result is POSITIVE SARS-CoV-2 target nucleic acids are DETECTED. The SARS-CoV-2 RNA is generally detectable in upper and lower  respiratory specimens dur ing the acute phase  of infection.  Positive  results are indicative of active infection with SARS-CoV-2.  Clinical  correlation with patient history and other diagnostic information is  necessary to determine patient infection status.  Positive results do  not rule out bacterial infection or co-infection with other viruses. If result is PRESUMPTIVE POSTIVE SARS-CoV-2 nucleic acids MAY BE PRESENT.   A presumptive positive result was obtained on the submitted specimen  and confirmed on repeat testing.  While 2019 novel coronavirus  (SARS-CoV-2) nucleic acids may be present in the submitted sample  additional confirmatory testing may be necessary for epidemiological  and / or clinical management purposes  to differentiate between  SARS-CoV-2 and other Sarbecovirus currently known to infect humans.  If clinically indicated additional testing with an alternate test  methodology [phone]) is advised. The SARS-CoV-2 RNA is generally  detectable in upper and lower respiratory sp ecimens during the acute  phase of infection. The expected result is Negative. Fact Sheet for Patients:  StrictlyIdeas.no Fact Sheet for Healthcare Providers: BankingDealers.co.za This test is not yet approved or cleared by the [place] FDA and has been authorized for detection and/or diagnosis of SARS-CoV-2 by FDA under an Emergency Use Authorization (EUA).  This EUA will remain in effect (meaning this test can be used) for the duration of the COVID-19 declaration under Section 564(b)(1) of the Act, 21 U.S.C. section 360bbb-3(b)(1), unless the authorization is terminated or revoked sooner. Performed at [facility], [address] [place], [place] [address]   Lactic acid, plasma     Status: Abnormal   Collection Time: 06/11/19 12:03 AM  Result Value Ref Range   Lactic Acid, Venous 4.0 (HH) 0.5 - 1.9 mmol/L    Comment: CRITICAL RESULT CALLED TO, READ BACK BY AND VERIFIED WITH: Andres Ege @0036  06/11/19 MKELLY Performed at [facility], [address] [place], [place] [address]   Comprehensive metabolic panel     Status: Abnormal   Collection Time: 06/11/19 12:03 AM  Result Value Ref Range   Sodium 136 135 - 145 mmol/L   Potassium 3.4 (L) 3.5 - 5.1 mmol/L   Chloride 108 98 - 111 mmol/L   CO2 18 (L) 22 - 32 mmol/L   Glucose, Bld 163 (H) 70 - 99 mg/dL   BUN 18 8 - 23 mg/dL   Creatinine, Ser 1.30 (H) 0.61 - 1.24 mg/dL   Calcium 8.3 (L) 8.9 - 10.3 mg/dL   Total Protein 6.7 6.5 - 8.1 g/dL   Albumin 3.6 3.5 - 5.0 g/dL   AST 21 15 - 41 U/L   ALT 15 0 - 44 U/L   Alkaline Phosphatase 35 (L) 38 - 126 U/L   Total Bilirubin 1.0 0.3 - 1.2 mg/dL   GFR calc non Af Amer 58 (L) >60 mL/min   GFR  calc Af Amer >60 >60 mL/min   Anion gap 10 5 - 15    Comment: Performed at [facility], [address] [place], [place] [address]  CBC WITH DIFFERENTIAL     Status: Abnormal   Collection Time: 06/11/19 12:03 AM  Result Value Ref Range   WBC 14.5 (H) 4.0 - 10.5 K/uL   RBC 5.10 4.22 - 5.81 MIL/uL   Hemoglobin 15.2 13.0 - 17.0 g/dL   HCT 46.9 39.0 - 52.0 %   MCV 92.0 80.0 - 100.0 fL   MCH 29.8 26.0 - 34.0 pg   MCHC 32.4 30.0 - 36.0 g/dL   RDW 13.3 11.5 - 15.5 %   Platelets 182 150 - 400 K/uL   nRBC 0.0 0.0 - 0.2 %   Neutrophils Relative % 95 %   Neutro Abs 13.6 (H) 1.7 - 7.7 K/uL   Lymphocytes Relative 2 %   Lymphs Abs 0.3 (L) 0.7 - 4.0 K/uL   Monocytes Relative 3 %   Monocytes Absolute 0.4 0.1 - 1.0 K/uL   Eosinophils Relative 0 %   Eosinophils Absolute 0.0 0.0 - 0.5 K/uL   Basophils Relative 0 %   Basophils Absolute 0.0 0.0 - 0.1 K/uL   Immature Granulocytes 0 %   Abs Immature Granulocytes 0.05 0.00 - 0.07 K/uL    Comment: Performed at [facility], [address], [place], [place] [address]  APTT     Status: None   Collection Time: 06/11/19 12:03 AM  Result Value Ref Range   aPTT 25 24 - 36 seconds    Comment: Performed at [facility], [address]  [place], [place] [address]  Protime-INR     Status: None   Collection Time: 06/11/19 12:03 AM  Result Value Ref Range   Prothrombin Time 14.6 11.4 - 15.2 seconds   INR 1.2 0.8 - 1.2    Comment: (NOTE) INR goal varies based on device and disease states. Performed at [facility], [address], [place], [place] [address]   Urinalysis, Routine w reflex microscopic     Status: Abnormal   Collection Time: 06/11/19 12:39 AM  Result Value Ref Range   Color, Urine AMBER (A) YELLOW    Comment: BIOCHEMICALS MAY BE AFFECTED BY COLOR   APPearance CLOUDY (A) CLEAR   Specific Gravity, Urine 1.023 1.005 - 1.030   pH 5.0 5.0 - 8.0   Glucose, UA NEGATIVE NEGATIVE mg/dL   Hgb urine dipstick LARGE (A) NEGATIVE   Bilirubin Urine NEGATIVE NEGATIVE   Ketones, ur 5 (A) NEGATIVE mg/dL   Protein, ur 100 (A) NEGATIVE mg/dL   Nitrite NEGATIVE NEGATIVE   Leukocytes,Ua LARGE (A) NEGATIVE   RBC / HPF >50 (H) 0 - 5 RBC/hpf   WBC, UA >50 (H) 0 - 5 WBC/hpf   Bacteria, UA NONE SEEN NONE SEEN   WBC Clumps PRESENT    Mucus PRESENT     Comment: Performed at [facility], [address], [place], [place] [address]   Dg Abdomen 1 View  Result Date: 06/11/2019 CLINICAL DATA:  Status post right renal stent EXAM: ABDOMEN - 1 VIEW COMPARISON:  CT abdomen/pelvis dated 05/10/2019 FINDINGS: Nonobstructive bowel gas pattern. No renal stent is visualized. Fiducial markers in the region of the prostate. IMPRESSION: No renal stent is visualized. Electronically Signed   By: Julian Hy M.D.   On: 06/11/2019 00:27   Dg Chest Portable 1 View  Result Date: 06/11/2019 CLINICAL DATA:  Fever, body aches, altered mental status  EXAM: PORTABLE CHEST 1 VIEW COMPARISON:  None. FINDINGS: Low lung volumes with bibasilar atelectasis. No focal consolidation. No pleural effusion or pneumothorax. The heart is normal in size. Postsurgical changes related to prior CABG. Median sternotomy. IMPRESSION: Low lung volumes with bibasilar atelectasis.  Electronically Signed   By: Julian Hy M.D.   On: 06/11/2019 01:39   Ct Renal Stone Study  Result Date: 06/11/2019 CLINICAL DATA:  Stent placed recently, altered mental status EXAM: CT ABDOMEN AND PELVIS WITHOUT CONTRAST TECHNIQUE: Multidetector CT imaging of the abdomen and pelvis was performed following the standard protocol without IV contrast. COMPARISON:  Radiograph 06/10/2019, CT 05/10/2019 FINDINGS: Lower chest: Lung bases demonstrate no pleural effusion. Partial atelectasis at the bilateral lung bases. Coronary vascular calcification. Ectatic aortic root measuring up to 3.8 cm. Small hiatal hernia. Hepatobiliary: Small calcified gallstones. No focal hepatic abnormality. No biliary dilatation Pancreas: Unremarkable. No pancreatic ductal dilatation or surrounding inflammatory changes. Spleen: Normal in size without focal abnormality. Adrenals/Urinary Tract: Adrenal glands are normal. Moderate bilateral perinephric fat stranding, increased compared to prior. Numerous stone fragments within the right kidney measuring up to 6 mm, previously noted dominant right renal pelvis stone no longer identified. Mild to moderate right hydronephrosis and hydroureter, secondary to multiple small stones in the distal right ureter ovary 2 cm segment of ureter. Stones measure up to 3 mm in diameter. These are visualized at the mid sacral level. Urinary bladder unremarkable Stomach/Bowel: Stomach is within normal limits. Appendix appears normal. No evidence of bowel wall thickening, distention, or inflammatory changes. Vascular/Lymphatic: Nonaneurysmal aorta. Moderate aortic atherosclerosis. No significantly enlarged lymph nodes Reproductive: Clips at the prostate.  Prostate calcifications. Other: Negative for free air or free fluid Musculoskeletal: No acute or suspicious osseous abnormality. Degenerative changes. IMPRESSION: 1. Mild to moderate right hydronephrosis and hydroureter, secondary to what appears to be  multiple adjacent small stones in the distal right ureter, spanning a craniocaudad length of ureter measuring 2 cm with stones measuring up to 3 mm in diameter. These are present at the level of mid sacrum. 2. Multiple stone fragments now visible in the right kidney as opposed to the dominant stone in the pelvis on the prior exam. 3. Increased bilateral perinephric fat stranding 4. Gallstones Electronically Signed   By: Donavan Foil M.D.   On: 06/11/2019 02:58    Pending Labs Unresulted Labs (From admission, onward)    Start     Ordered   06/10/19 2330  Lactic acid, plasma  Now then every 2 hours,   STAT     06/10/19 2330   06/10/19 2330  Blood Culture (routine x 2)  BLOOD CULTURE X 2,   STAT     06/10/19 2330   06/10/19 2330  Urine culture  ONCE - STAT,   STAT     06/10/19 2330   Signed and Held  HIV antibody (Routine Testing)  Once,   R     Signed and Held   Signed and Held  CBC  Tomorrow morning,   R     Signed and Held   Signed and Held  Comprehensive metabolic panel  Tomorrow morning,   R     Signed and Held   Signed and Held  Hemoglobin A1c  Once,   R    Comments: To assess prior glycemic control    Signed and Held          Vitals/Pain Today's Vitals   06/11/19 0230 06/11/19 0300 06/11/19 0309 06/11/19 0350  BP:  110/74 (!) 97/57  119/64  Pulse: 94 89  94  Resp: (!) 30     Temp:   98.9 F (37.2 C)   TempSrc:   Oral   SpO2: 93% 97%  96%  Weight:      Height:      PainSc:        Isolation Precautions No active isolations  Medications Medications  sodium chloride 0.9 % bolus 1,000 mL (0 mLs Intravenous Stopped 06/11/19 0235)    And  sodium chloride 0.9 % bolus 1,000 mL (0 mLs Intravenous Stopped 06/11/19 0235)    And  sodium chloride 0.9 % bolus 1,000 mL (0 mLs Intravenous Stopped 06/11/19 0150)  ceFEPIme (MAXIPIME) 2 g in sodium chloride 0.9 % 100 mL IVPB (0 g Intravenous Stopped 06/11/19 0139)    Mobility Walks with cane High fall risk   Focused  Assessments    R Recommendations: See Admitting Provider Note  Report given to:   Additional Notes: o2 4lpm, stroke deficits on the left side.

## 2019-06-12 ENCOUNTER — Encounter (HOSPITAL_COMMUNITY): Payer: Self-pay | Admitting: Urology

## 2019-06-12 DIAGNOSIS — N200 Calculus of kidney: Secondary | ICD-10-CM

## 2019-06-12 LAB — BLOOD CULTURE ID PANEL (REFLEXED)

## 2019-06-12 LAB — GLUCOSE, CAPILLARY
Glucose-Capillary: 108 mg/dL — ABNORMAL HIGH (ref 70–99)
Glucose-Capillary: 109 mg/dL — ABNORMAL HIGH (ref 70–99)
Glucose-Capillary: 118 mg/dL — ABNORMAL HIGH (ref 70–99)
Glucose-Capillary: 127 mg/dL — ABNORMAL HIGH (ref 70–99)
Glucose-Capillary: 170 mg/dL — ABNORMAL HIGH (ref 70–99)

## 2019-06-12 LAB — HIV ANTIBODY (ROUTINE TESTING W REFLEX): HIV Screen 4th Generation wRfx: NONREACTIVE

## 2019-06-12 MED ORDER — LORATADINE 10 MG PO TABS: 10.0000 mg | ORAL_TABLET | Freq: Every day | ORAL | Status: DC | PRN

## 2019-06-12 MED ORDER — PHENOL 1.4 % MT LIQD: 1.0000 | OROMUCOSAL | Status: DC | PRN

## 2019-06-12 MED ORDER — SENNOSIDES-DOCUSATE SODIUM 8.6-50 MG PO TABS: 2.0000 | ORAL_TABLET | Freq: Every evening | ORAL | Status: DC | PRN

## 2019-06-12 MED ORDER — HYDROCORTISONE (PERIANAL) 2.5 % EX CREA
1.0000 "application " | TOPICAL_CREAM | Freq: Four times a day (QID) | CUTANEOUS | Status: DC | PRN
  Filled 2019-06-12: qty 28.35

## 2019-06-12 MED ORDER — SALINE SPRAY 0.65 % NA SOLN: 1.0000 | NASAL | Status: DC | PRN

## 2019-06-12 MED ORDER — POLYETHYLENE GLYCOL 3350 17 G PO PACK: 17.0000 g | PACK | Freq: Every day | ORAL | Status: DC | PRN

## 2019-06-12 MED ORDER — HYDRALAZINE HCL 20 MG/ML IJ SOLN: 10.0000 mg | INTRAMUSCULAR | Status: DC | PRN

## 2019-06-12 MED ORDER — ALUM & MAG HYDROXIDE-SIMETH 200-200-20 MG/5ML PO SUSP: 30.0000 mL | ORAL | Status: DC | PRN

## 2019-06-12 MED ORDER — HYDROCORTISONE 1 % EX CREA
1.0000 "application " | TOPICAL_CREAM | Freq: Three times a day (TID) | CUTANEOUS | Status: DC | PRN
  Filled 2019-06-12: qty 28

## 2019-06-12 MED ORDER — SODIUM CHLORIDE 0.9 % IV SOLN
1.0000 g | Freq: Three times a day (TID) | INTRAVENOUS | Status: DC
  Administered 2019-06-12 – 2019-06-14 (×6): 1 g via INTRAVENOUS
  Filled 2019-06-12 (×6): qty 1

## 2019-06-12 MED ORDER — GUAIFENESIN-DM 100-10 MG/5ML PO SYRP: 5.0000 mL | ORAL_SOLUTION | ORAL | Status: DC | PRN

## 2019-06-12 MED ORDER — POLYVINYL ALCOHOL 1.4 % OP SOLN: 1.0000 [drp] | OPHTHALMIC | Status: DC | PRN

## 2019-06-12 MED ORDER — SODIUM CHLORIDE 0.9 % IV SOLN
INTRAVENOUS | Status: AC
  Administered 2019-06-12 – 2019-06-13 (×2): via INTRAVENOUS

## 2019-06-12 MED ORDER — LIP MEDEX EX OINT
1.0000 "application " | TOPICAL_OINTMENT | CUTANEOUS | Status: DC | PRN
  Filled 2019-06-12: qty 7

## 2019-06-12 MED ORDER — MUSCLE RUB 10-15 % EX CREA
1.0000 "application " | TOPICAL_CREAM | CUTANEOUS | Status: DC | PRN
  Filled 2019-06-12: qty 85

## 2019-06-12 NOTE — Progress Notes (Signed)
CRITICAL VALUE ALERT  Critical Value:  Aerobic bottle gram variable rods (1/2)  Date & Time Notied:  06/12/2019 0506  Provider Notified: Dr. Darrick Meigs  Orders Received/Actions taken:

## 2019-06-12 NOTE — Plan of Care (Signed)

## 2019-06-12 NOTE — Progress Notes (Signed)
Pharmacy Antibiotic Note  Arthur Cisneros. is a 63 y.o. male admitted on 06/10/2019 with UTI./sepsis Pharmacy has been consulted for Merrem dosing. Elevated WBC, febrile. UCx + Acinetobacter >100,000 CFU/ml and 1 BCx + Gram variable rod. D/W MD and will escalate therapy  Plan: Merrem 1gm IV q8h F/U cxs and sensitivities Monitor labs, V/S and patient improvement.  Height: 5\' 9"  (175.3 cm) Weight: 210 lb 5.1 oz (95.4 kg) IBW/kg (Calculated) : 70.7  Temp (24hrs), Avg:98.6 F (37 C), Min:98.5 F (36.9 C), Max:98.8 F (37.1 C)  Recent Labs  Lab 06/11/19 0003 06/11/19 0321 06/11/19 0738  WBC 14.5*  --  15.8*  CREATININE 1.30*  --  1.41*  LATICACIDVEN 4.0* 3.0*  --     Estimated Creatinine Clearance: 61.1 mL/min (A) (by C-G formula based on SCr of 1.41 mg/dL (H)).    Allergies  Allergen Reactions  . Lyrica [Pregabalin]     hallucination  . Motrin [Ibuprofen]     "dr said I can not take it."    Antimicrobials this admission: Cefepime 7/28 >> 7/29 Merrem 7/29>>    Dose adjustments this admission: N/A  Microbiology results: 7/28 BCx: gram variable rod in 1 bottle. BCID did not identify org, but could be a different acinetobacter species 7/28 UCx: Acinteobacter >100CFU/ml f/u sensitivities 728 MRSA PCR: negative  Thank you for allowing pharmacy to be a part of this patient's care.  Isac Sarna, BS Pharm D, BCPS Clinical Pharmacist Pager [phone] 06/12/2019 1:28 PM

## 2019-06-12 NOTE — Progress Notes (Signed)
PROGRESS NOTE    Arthur Cisneros.  VEL:[id number] DOB: Jun 01, 1956 DOA: 06/10/2019 PCP: Petra Kuba, MD   Brief Narrative:  63 year old with history of essential hypertension, hyperlipidemia, diabetes mellitus type 2, kidney stone status post stent placement came to the ER with complaints of fevers and chills.  Patient had ureteral stent placed prior to hospitalization but apparently it came out.  During the hospitalization he was found to be septic with CT showing multiple stones in the right ureter.  Patient was taken to the OR for repeat stenting.  Started on broad-spectrum antibiotics.   Assessment & Plan:   Active Problems:   Sepsis ([place])   Sepsis secondary to complicated urinary tract infection - Appears to be improving.  Urine culture this morning positive for actinobacter -Changed to IV meropenem, will adjust per culture -Continue IV fluids -Continue supportive care.  Acute kidney injury -Baseline creatinine 0.8.  Continue IV fluids.  Component of dehydration and postobstructive nephropathy. -Status post stent placed.  Multiple renal stone causing obstructive uropathy -Status post new stent placement on 7/28. -Supportive care.  History of seizure disorder -Continue Keppra  Diabetes mellitus type 2 -Continue insulin sliding scale Accu-Chek.  Holding metformin.  Hyperlipidemia -Continue statin  DVT prophylaxis: SCDs Code Status: Full code Family Communication: None at bedside Disposition Plan: Maintain hospital stay for IV meropenem, awaiting further culture sensitivity to further adjust and change to oral antibiotic possibly.  Consultants:   Urology  Procedures:   Stent replacement on 7/28  Antimicrobials:   Meropenem day 1   Subjective: Patient reports of abdominal discomfort and some urinary urgency.  Review of Systems Otherwise negative except as per HPI, including: General: Denies fever, chills, night sweats or unintended weight loss.  Resp: Denies cough, wheezing, shortness of breath. Cardiac: Denies chest pain, palpitations, orthopnea, paroxysmal nocturnal dyspnea. GI: Denies abdominal pain, nausea, vomiting, diarrhea or constipation GU: Denies dysuria, frequency, hesitancy or incontinence MS: Denies muscle aches, joint pain or swelling Neuro: Denies headache, neurologic deficits (focal weakness, numbness, tingling), abnormal gait Psych: Denies anxiety, depression, SI/HI/AVH Skin: Denies new rashes or lesions ID: Denies sick contacts, exotic exposures, travel  Objective: Vitals:   06/11/19 1900 06/11/19 2230 06/11/19 2300 06/12/19 0553  BP:  130/84  106/72  Pulse:  88  86  Resp:  17  18  Temp: 98.5 F (36.9 C) 98.6 F (37 C)  98.8 F (37.1 C)  TempSrc: Oral Oral  Oral  SpO2:  94%  94%  Weight:   95.4 kg   Height:   5\' 9"  (1.753 m)     Intake/Output Summary (Last 24 hours) at 06/12/2019 1325 Last data filed at 06/12/2019 0900 Gross per 24 hour  Intake 700 ml  Output 500 ml  Net 200 ml   Filed Weights   06/10/19 2311 06/11/19 2300  Weight: 95.3 kg 95.4 kg    Examination:  General exam: Appears calm and comfortable  Respiratory system: Clear to auscultation. Respiratory effort normal. Cardiovascular system: S1 & S2 heard, RRR. No JVD, murmurs, rubs, gallops or clicks. No pedal edema. Gastrointestinal system: Abdomen is nondistended, soft and nontender. No organomegaly or masses felt. Normal bowel sounds heard. Central nervous system: Alert and oriented. No focal neurological deficits. Extremities: Symmetric 5 x 5 power. Skin: No rashes, lesions or ulcers Psychiatry: Judgement and insight appear normal. Mood & affect appropriate.   Condom catheter in place  Data Reviewed:   CBC: Recent Labs  Lab 06/11/19 0003 06/11/19 0738  WBC 14.5*  15.8*  NEUTROABS 13.6*  --   HGB 15.2 14.1  HCT 46.9 44.7  MCV 92.0 95.1  PLT 182 026*   Basic Metabolic Panel: Recent Labs  Lab 06/11/19 0003 06/11/19  0738  NA 136 140  K 3.4* 4.0  CL 108 108  CO2 18* 20*  GLUCOSE 163* 163*  BUN 18 18  CREATININE 1.30* 1.41*  CALCIUM 8.3* 7.7*   GFR: Estimated Creatinine Clearance: 61.1 mL/min (A) (by C-G formula based on SCr of 1.41 mg/dL (H)). Liver Function Tests: Recent Labs  Lab 06/11/19 0003 06/11/19 0738  AST 21 21  ALT 15 14  ALKPHOS 35* 30*  BILITOT 1.0 1.0  PROT 6.7 6.1*  ALBUMIN 3.6 3.2*   No results for input(s): LIPASE, AMYLASE in the last 168 hours. No results for input(s): AMMONIA in the last 168 hours. Coagulation Profile: Recent Labs  Lab 06/11/19 0003  INR 1.2   Cardiac Enzymes: No results for input(s): CKTOTAL, CKMB, CKMBINDEX, TROPONINI in the last 168 hours. BNP (last 3 results) No results for input(s): PROBNP in the last 8760 hours. HbA1C: Recent Labs    06/11/19 0738  HGBA1C 6.0*   CBG: Recent Labs  Lab 06/11/19 1135 06/11/19 1658 06/11/19 2344 06/12/19 0618 06/12/19 1220  GLUCAP 197* 80 86 109* 118*   Lipid Profile: No results for input(s): CHOL, HDL, LDLCALC, TRIG, CHOLHDL, LDLDIRECT in the last 72 hours. Thyroid Function Tests: No results for input(s): TSH, T4TOTAL, FREET4, T3FREE, THYROIDAB in the last 72 hours. Anemia Panel: No results for input(s): VITAMINB12, FOLATE, FERRITIN, TIBC, IRON, RETICCTPCT in the last 72 hours. Sepsis Labs: Recent Labs  Lab 06/11/19 0003 06/11/19 0321  LATICACIDVEN 4.0* 3.0*    Recent Results (from the past 240 hour(s))  SARS Coronavirus 2 (Performed in [place] hospital lab)     Status: None   Collection Time: 06/03/19  2:38 PM   Specimen: Nasal Swab  Result Value Ref Range Status   SARS Coronavirus 2 NEGATIVE NEGATIVE Final    Comment: (NOTE) SARS-CoV-2 target nucleic acids are NOT DETECTED. The SARS-CoV-2 RNA is generally detectable in upper and lower respiratory specimens during the acute phase of infection. Negative results do not preclude SARS-CoV-2 infection, do not rule out co-infections  with other pathogens, and should not be used as the sole basis for treatment or other patient management decisions. Negative results must be combined with clinical observations, patient history, and epidemiological information. The expected result is Negative. Fact Sheet for Patients: SugarRoll.be Fact Sheet for Healthcare Providers: https://www.woods-mathews.com/ This test is not yet approved or cleared by the [place] FDA and  has been authorized for detection and/or diagnosis of SARS-CoV-2 by FDA under an Emergency Use Authorization (EUA). This EUA will remain  in effect (meaning this test can be used) for the duration of the COVID-19 declaration under Section 56 4(b)(1) of the Act, 21 U.S.C. section 360bbb-3(b)(1), unless the authorization is terminated or revoked sooner. Performed at [facility], [place] [address], [place], [place] [address]   SARS Coronavirus 2 (CEPHEID - Performed in [facility] hospital lab), Hosp Order     Status: None   Collection Time: 06/10/19 11:59 PM   Specimen: Nasopharyngeal Swab  Result Value Ref Range Status   SARS Coronavirus 2 NEGATIVE NEGATIVE Final    Comment: (NOTE) If result is NEGATIVE SARS-CoV-2 target nucleic acids are NOT DETECTED. The SARS-CoV-2 RNA is generally detectable in upper and lower  respiratory specimens during the acute phase of infection.  The lowest  concentration of SARS-CoV-2 viral copies this assay can detect is 250  copies / mL. A negative result does not preclude SARS-CoV-2 infection  and should not be used as the sole basis for treatment or other  patient management decisions.  A negative result may occur with  improper specimen collection / handling, submission of specimen other  than nasopharyngeal swab, presence of viral mutation(s) within the  areas targeted by this assay, and inadequate number of viral copies  (<250 copies / mL). A negative result must be combined  with clinical  observations, patient history, and epidemiological information. If result is POSITIVE SARS-CoV-2 target nucleic acids are DETECTED. The SARS-CoV-2 RNA is generally detectable in upper and lower  respiratory specimens dur ing the acute phase of infection.  Positive  results are indicative of active infection with SARS-CoV-2.  Clinical  correlation with patient history and other diagnostic information is  necessary to determine patient infection status.  Positive results do  not rule out bacterial infection or co-infection with other viruses. If result is PRESUMPTIVE POSTIVE SARS-CoV-2 nucleic acids MAY BE PRESENT.   A presumptive positive result was obtained on the submitted specimen  and confirmed on repeat testing.  While 2019 novel coronavirus  (SARS-CoV-2) nucleic acids may be present in the submitted sample  additional confirmatory testing may be necessary for epidemiological  and / or clinical management purposes  to differentiate between  SARS-CoV-2 and other Sarbecovirus currently known to infect humans.  If clinically indicated additional testing with an alternate test  methodology [phone]) is advised. The SARS-CoV-2 RNA is generally  detectable in upper and lower respiratory sp ecimens during the acute  phase of infection. The expected result is Negative. Fact Sheet for Patients:  StrictlyIdeas.no Fact Sheet for Healthcare Providers: BankingDealers.co.za This test is not yet approved or cleared by the [place] FDA and has been authorized for detection and/or diagnosis of SARS-CoV-2 by FDA under an Emergency Use Authorization (EUA).  This EUA will remain in effect (meaning this test can be used) for the duration of the COVID-19 declaration under Section 564(b)(1) of the Act, 21 U.S.C. section 360bbb-3(b)(1), unless the authorization is terminated or revoked sooner. Performed at [facility], [address], [place], [place] [address]   Blood Culture (routine x 2)     Status: Abnormal (Preliminary result)   Collection Time: 06/11/19 12:03 AM   Specimen: BLOOD LEFT HAND  Result Value Ref Range Status   Specimen Description   Final    BLOOD LEFT HAND Performed at [facility], [address], [place], [place] [address]    Special Requests   Final    BOTTLES DRAWN AEROBIC AND ANAEROBIC Blood Culture adequate volume Performed at [facility], [address] [place], [place] [address]    Culture  Setup Time (A)  Final    GRAM VARIABLE ROD AEROBIC BOTTLE ONLY Gram Stain Report Called to,Read Back By and Verified With: K ROSE,RN @0506  06/12/19 mkelly Organism ID to follow CRITICAL RESULT CALLED TO, READ BACK BY AND VERIFIED WITH: Merrilee Seashore COFFEE [id number] MLM Performed at [facility], [place] [address] [place], [place] [address]    Culture GRAM VARIABLE ROD (A)  Final   Report Status PENDING  Incomplete  Blood Culture ID Panel (Reflexed)     Status: None   Collection Time: 06/11/19 12:03 AM  Result Value Ref Range Status   Enterococcus species NOT DETECTED NOT DETECTED Final   Listeria monocytogenes NOT  DETECTED NOT DETECTED Final   Staphylococcus species NOT DETECTED NOT DETECTED Final   Staphylococcus aureus (BCID) NOT DETECTED NOT DETECTED Final   Streptococcus species NOT DETECTED NOT DETECTED Final   Streptococcus agalactiae NOT DETECTED NOT DETECTED Final   Streptococcus pneumoniae NOT DETECTED NOT DETECTED Final   Streptococcus pyogenes NOT DETECTED NOT DETECTED Final   Acinetobacter baumannii NOT DETECTED NOT DETECTED Final   Enterobacteriaceae species NOT DETECTED NOT DETECTED Final   Enterobacter cloacae complex NOT DETECTED NOT DETECTED Final   Escherichia coli NOT DETECTED NOT DETECTED Final   Klebsiella oxytoca NOT DETECTED NOT DETECTED Final   Klebsiella pneumoniae NOT DETECTED NOT DETECTED Final   Proteus species NOT DETECTED NOT DETECTED Final   Serratia marcescens  NOT DETECTED NOT DETECTED Final   Haemophilus influenzae NOT DETECTED NOT DETECTED Final   Neisseria meningitidis NOT DETECTED NOT DETECTED Final   Pseudomonas aeruginosa NOT DETECTED NOT DETECTED Final   Candida albicans NOT DETECTED NOT DETECTED Final   Candida glabrata NOT DETECTED NOT DETECTED Final   Candida krusei NOT DETECTED NOT DETECTED Final   Candida parapsilosis NOT DETECTED NOT DETECTED Final   Candida tropicalis NOT DETECTED NOT DETECTED Final    Comment: Performed at [facility], [place] [address], [place], [place] [address]  Blood Culture (routine x 2)     Status: None (Preliminary result)   Collection Time: 06/11/19 12:06 AM   Specimen: BLOOD  Result Value Ref Range Status   Specimen Description BLOOD RIGHT ANTECUBITAL  Final   Special Requests   Final    BOTTLES DRAWN AEROBIC AND ANAEROBIC Blood Culture results may not be optimal due to an inadequate volume of blood received in culture bottles   Culture   Final    NO GROWTH 1 DAY Performed at [facility], [address], [place], [place] [address]    Report Status PENDING  Incomplete  Urine culture     Status: Abnormal (Preliminary result)   Collection Time: 06/11/19 12:39 AM   Specimen: In/Out Cath Urine  Result Value Ref Range Status   Specimen Description   Final    IN/OUT CATH URINE Performed at [facility], [address] [place], [place] [address]    Special Requests   Final    NONE Performed at [facility], [address] [place], [place] [address]    Culture (A)  Final    >=100,000 COLONIES/mL ACINETOBACTER SPECIES SUSCEPTIBILITIES TO FOLLOW Performed at [facility], [place] [address], [place], [place] [address]    Report Status PENDING  Incomplete  MRSA PCR Screening     Status: None   Collection Time: 06/11/19  6:26 AM   Specimen: Nasal Mucosa; Nasopharyngeal  Result Value Ref Range Status   MRSA by PCR NEGATIVE NEGATIVE Final    Comment:        The GeneXpert MRSA Assay (FDA  approved for NASAL specimens only), is one component of a comprehensive MRSA colonization surveillance program. It is not intended to diagnose MRSA infection nor to guide or monitor treatment for MRSA infections. Performed at [facility], [address], [place], [place] [address]          Radiology Studies: Dg Abdomen 1 View  Result Date: 06/11/2019 CLINICAL DATA:  Status post right renal stent EXAM: ABDOMEN - 1 VIEW COMPARISON:  CT abdomen/pelvis dated 05/10/2019 FINDINGS: Nonobstructive bowel gas pattern. No renal stent is visualized. Fiducial markers in the region of the prostate. IMPRESSION: No renal stent  is visualized. Electronically Signed   By: Julian Hy M.D.   On: 06/11/2019 00:27   Dg Retrograde Pyelogram  Result Date: 06/11/2019 CLINICAL DATA:  Renal calculus EXAM: RETROGRADE PYELOGRAM COMPARISON:  CT from the same day FINDINGS: A series of fluoroscopic spot images documents unilateral retrograde pyelography with placement of ureteral stent. Incomplete opacification of the proximal ureter. No definite urothelial lesion identified. Laterality not indicated on images. IMPRESSION: Retrograde pyelogram and ureteral stent placement. Electronically Signed   By: Lucrezia Europe M.D.   On: 06/11/2019 10:39   Dg Chest Portable 1 View  Result Date: 06/11/2019 CLINICAL DATA:  Fever, body aches, altered mental status EXAM: PORTABLE CHEST 1 VIEW COMPARISON:  None. FINDINGS: Low lung volumes with bibasilar atelectasis. No focal consolidation. No pleural effusion or pneumothorax. The heart is normal in size. Postsurgical changes related to prior CABG. Median sternotomy. IMPRESSION: Low lung volumes with bibasilar atelectasis. Electronically Signed   By: Julian Hy M.D.   On: 06/11/2019 01:39   Ct Renal Stone Study  Result Date: 06/11/2019 CLINICAL DATA:  Stent placed recently, altered mental status EXAM: CT ABDOMEN AND PELVIS WITHOUT CONTRAST TECHNIQUE: Multidetector CT imaging  of the abdomen and pelvis was performed following the standard protocol without IV contrast. COMPARISON:  Radiograph 06/10/2019, CT 05/10/2019 FINDINGS: Lower chest: Lung bases demonstrate no pleural effusion. Partial atelectasis at the bilateral lung bases. Coronary vascular calcification. Ectatic aortic root measuring up to 3.8 cm. Small hiatal hernia. Hepatobiliary: Small calcified gallstones. No focal hepatic abnormality. No biliary dilatation Pancreas: Unremarkable. No pancreatic ductal dilatation or surrounding inflammatory changes. Spleen: Normal in size without focal abnormality. Adrenals/Urinary Tract: Adrenal glands are normal. Moderate bilateral perinephric fat stranding, increased compared to prior. Numerous stone fragments within the right kidney measuring up to 6 mm, previously noted dominant right renal pelvis stone no longer identified. Mild to moderate right hydronephrosis and hydroureter, secondary to multiple small stones in the distal right ureter ovary 2 cm segment of ureter. Stones measure up to 3 mm in diameter. These are visualized at the mid sacral level. Urinary bladder unremarkable Stomach/Bowel: Stomach is within normal limits. Appendix appears normal. No evidence of bowel wall thickening, distention, or inflammatory changes. Vascular/Lymphatic: Nonaneurysmal aorta. Moderate aortic atherosclerosis. No significantly enlarged lymph nodes Reproductive: Clips at the prostate.  Prostate calcifications. Other: Negative for free air or free fluid Musculoskeletal: No acute or suspicious osseous abnormality. Degenerative changes. IMPRESSION: 1. Mild to moderate right hydronephrosis and hydroureter, secondary to what appears to be multiple adjacent small stones in the distal right ureter, spanning a craniocaudad length of ureter measuring 2 cm with stones measuring up to 3 mm in diameter. These are present at the level of mid sacrum. 2. Multiple stone fragments now visible in the right kidney as  opposed to the dominant stone in the pelvis on the prior exam. 3. Increased bilateral perinephric fat stranding 4. Gallstones Electronically Signed   By: Donavan Foil M.D.   On: 06/11/2019 02:58        Scheduled Meds: . atorvastatin  40 mg Oral Daily  . insulin aspart  0-9 Units Subcutaneous Q6H  . levETIRAcetam  250 mg Oral BID  . phenazopyridine  100 mg Oral TID WC   Continuous Infusions: . meropenem (MERREM) IV       LOS: 1 day   Time spent= 35 mins    Ankit Arsenio Loader, MD [facility]  If 7PM-7AM, please contact night-coverage [email or web address] 06/12/2019, 1:25 PM

## 2019-06-12 NOTE — Progress Notes (Signed)
Patient ID: Arthur Advani., male   DOB: [birth year], 63 y.o.   MRN: [id number]   Checked patient on Epic. Cultures still pending.  Patient appears to be clinically improving.  Continue broad spectrum antibiotics pending culture results.  Will then need to continue 10-14 days of total culture specific antibiotics.  Stent in place.  I have arranged f/u for patient with me on 8/14.

## 2019-06-12 NOTE — Addendum Note (Signed)
Addendum  created 06/12/19 4628 by Mickel Baas, CRNA   Charge Capture section accepted

## 2019-06-13 DIAGNOSIS — R7881 Bacteremia: Secondary | ICD-10-CM

## 2019-06-13 LAB — MAGNESIUM: Magnesium: 1.7 mg/dL (ref 1.7–2.4)

## 2019-06-13 LAB — GLUCOSE, CAPILLARY
Glucose-Capillary: 110 mg/dL — ABNORMAL HIGH (ref 70–99)
Glucose-Capillary: 123 mg/dL — ABNORMAL HIGH (ref 70–99)
Glucose-Capillary: 131 mg/dL — ABNORMAL HIGH (ref 70–99)
Glucose-Capillary: 98 mg/dL (ref 70–99)

## 2019-06-13 LAB — BASIC METABOLIC PANEL
Anion gap: 9 (ref 5–15)
BUN: 17 mg/dL (ref 8–23)
CO2: 23 mmol/L (ref 22–32)
Calcium: 7.7 mg/dL — ABNORMAL LOW (ref 8.9–10.3)
Chloride: 109 mmol/L (ref 98–111)
Creatinine, Ser: 1 mg/dL (ref 0.61–1.24)
GFR calc Af Amer: >60 mL/min (ref >60)
GFR calc non Af Amer: >60 mL/min (ref >60)
Glucose, Bld: 127 mg/dL — ABNORMAL HIGH (ref 70–99)
Potassium: 3.4 mmol/L — ABNORMAL LOW (ref 3.5–5.1)
Sodium: 141 mmol/L (ref 135–145)

## 2019-06-13 LAB — CBC
HCT: 38.8 % — ABNORMAL LOW (ref 39.0–52.0)
Hemoglobin: 12.6 g/dL — ABNORMAL LOW (ref 13.0–17.0)
MCH: 30.4 pg (ref 26.0–34.0)
MCHC: 32.5 g/dL (ref 30.0–36.0)
MCV: 93.5 fL (ref 80.0–100.0)
Platelets: 157 10*3/uL (ref 150–400)
RBC: 4.15 MIL/uL — ABNORMAL LOW (ref 4.22–5.81)
RDW: 13.3 % (ref 11.5–15.5)
WBC: 10.1 10*3/uL (ref 4.0–10.5)
nRBC: 0 % (ref 0.0–0.2)

## 2019-06-13 MED ORDER — MAGNESIUM OXIDE 400 (241.3 MG) MG PO TABS
800.0000 mg | ORAL_TABLET | Freq: Once | ORAL | Status: AC
  Administered 2019-06-13: 800 mg via ORAL
  Filled 2019-06-13: qty 2

## 2019-06-13 MED ORDER — SODIUM CHLORIDE 0.9 % IV SOLN
INTRAVENOUS | Status: DC
  Administered 2019-06-13: 17:00:00 via INTRAVENOUS

## 2019-06-13 MED ORDER — POTASSIUM CHLORIDE CRYS ER 20 MEQ PO TBCR
40.0000 meq | EXTENDED_RELEASE_TABLET | Freq: Once | ORAL | Status: AC
  Administered 2019-06-13: 40 meq via ORAL
  Filled 2019-06-13: qty 2

## 2019-06-13 NOTE — Progress Notes (Signed)
PROGRESS NOTE    Arthur Cisneros.  GDJ:[id number] DOB: 03/17/1956 DOA: 06/10/2019 PCP: Petra Kuba, MD   Brief Narrative:  63 year old with history of essential hypertension, hyperlipidemia, diabetes mellitus type 2, kidney stone status post stent placement came to the ER with complaints of fevers and chills.  Patient had ureteral stent placed prior to hospitalization but apparently it came out.  During the hospitalization he was found to be septic with CT showing multiple stones in the right ureter.  Patient was taken to the OR for repeat stenting.  Started on broad-spectrum antibiotics.   Assessment & Plan:   Active Problems:   Sepsis ([place])   Sepsis secondary to complicated urinary tract infection - Urine cultures growing Acinetobacter and Enterobacter. -We will continue meropenem for now.  Adjust antibiotics when sensitivities of both have resulted. -Continue IV fluids -Continue supportive care.  Acute kidney injury -This is improved with IV fluids.  It is down to 1.0.  Which is close to his baseline of 0.8.  Multiple renal stone causing obstructive uropathy -Status post new stent placement on 7/28. -Supportive care.  History of seizure disorder -Continue Keppra  Diabetes mellitus type 2 -Continue insulin sliding scale Accu-Chek.  Holding metformin.  Hyperlipidemia -Continue statin  DVT prophylaxis: SCDs Code Status: Full code Family Communication: None at bedside Disposition Plan: Continue IV meropenem until we have full culture data and sensitivities.  Consultants:   Urology  Procedures:   Stent replacement on 7/28  Antimicrobials:   Meropenem day 2   Subjective: Reports of mild urinary urgency but improved from yesterday.  Really wants to go home but I explained then we need more culture data before he can be safely transitioned to oral.  Patient understands this and willing to stay back in the hospital.  Review of Systems Otherwise negative  except as per HPI, including: General = no fevers, chills, dizziness, malaise, fatigue HEENT/EYES = negative for pain, redness, loss of vision, double vision, blurred vision, loss of hearing, sore throat, hoarseness, dysphagia Cardiovascular= negative for chest pain, palpitation, murmurs, lower extremity swelling Respiratory/lungs= negative for shortness of breath, cough, hemoptysis, wheezing, mucus production Gastrointestinal= negative for nausea, vomiting,, abdominal pain, melena, hematemesis Genitourinary= negative for Dysuria, Hematuria, Change in Urinary Frequency MSK = Negative for arthralgia, myalgias, Back Pain, Joint swelling  Neurology= Negative for headache, seizures, numbness, tingling  Psychiatry= Negative for anxiety, depression, suicidal and homocidal ideation Allergy/Immunology= Medication/Food allergy as listed  Skin= Negative for Rash, lesions, ulcers, itching   Objective: Vitals:   06/12/19 1500 06/12/19 1954 06/12/19 2132 06/13/19 0539  BP: 122/82  129/88 138/77  Pulse: 79  82 85  Resp: 18  18 18   Temp: 98.3 F (36.8 C)  98.3 F (36.8 C) 98.3 F (36.8 C)  TempSrc: Oral  Oral Oral  SpO2: 97% 98% 94% 93%  Weight:      Height:        Intake/Output Summary (Last 24 hours) at 06/13/2019 1150 Last data filed at 06/13/2019 0842 Gross per 24 hour  Intake 2047.8 ml  Output 1775 ml  Net 272.8 ml   Filed Weights   06/10/19 2311 06/11/19 2300  Weight: 95.3 kg 95.4 kg    Examination:  Constitutional: NAD, calm, comfortable Eyes: PERRL, lids and conjunctivae normal ENMT: Mucous membranes are moist. Posterior pharynx clear of any exudate or lesions.Normal dentition.  Neck: normal, supple, no masses, no thyromegaly Respiratory: clear to auscultation bilaterally, no wheezing, no crackles. Normal respiratory effort. No accessory muscle  use.  Cardiovascular: Regular rate and rhythm, no murmurs / rubs / gallops. No extremity edema. 2+ pedal pulses. No carotid bruits.   Abdomen: no tenderness, no masses palpated. No hepatosplenomegaly. Bowel sounds positive.  Musculoskeletal: no clubbing / cyanosis. No joint deformity upper and lower extremities. Good ROM, no contractures. Normal muscle tone.  Skin: no rashes, lesions, ulcers. No induration Neurologic: CN 2-12 grossly intact. Sensation intact, DTR normal. Strength 5/5 in all 4.  Psychiatric: Normal judgment and insight. Alert and oriented x 3. Normal mood.   Condom catheter in place  Data Reviewed:   CBC: Recent Labs  Lab 06/11/19 0003 06/11/19 0738 06/13/19 0534  WBC 14.5* 15.8* 10.1  NEUTROABS 13.6*  --   --   HGB 15.2 14.1 12.6*  HCT 46.9 44.7 38.8*  MCV 92.0 95.1 93.5  PLT 182 144* 224   Basic Metabolic Panel: Recent Labs  Lab 06/11/19 0003 06/11/19 0738 06/13/19 0534  NA 136 140 141  K 3.4* 4.0 3.4*  CL 108 108 109  CO2 18* 20* 23  GLUCOSE 163* 163* 127*  BUN 18 18 17   CREATININE 1.30* 1.41* 1.00  CALCIUM 8.3* 7.7* 7.7*  MG  --   --  1.7   GFR: Estimated Creatinine Clearance: 86.2 mL/min (by C-G formula based on SCr of 1 mg/dL). Liver Function Tests: Recent Labs  Lab 06/11/19 0003 06/11/19 0738  AST 21 21  ALT 15 14  ALKPHOS 35* 30*  BILITOT 1.0 1.0  PROT 6.7 6.1*  ALBUMIN 3.6 3.2*   No results for input(s): LIPASE, AMYLASE in the last 168 hours. No results for input(s): AMMONIA in the last 168 hours. Coagulation Profile: Recent Labs  Lab 06/11/19 0003  INR 1.2   Cardiac Enzymes: No results for input(s): CKTOTAL, CKMB, CKMBINDEX, TROPONINI in the last 168 hours. BNP (last 3 results) No results for input(s): PROBNP in the last 8760 hours. HbA1C: Recent Labs    06/11/19 0738  HGBA1C 6.0*   CBG: Recent Labs  Lab 06/12/19 1745 06/12/19 1849 06/12/19 2357 06/13/19 0537 06/13/19 1116  GLUCAP 127* 170* 108* 123* 131*   Lipid Profile: No results for input(s): CHOL, HDL, LDLCALC, TRIG, CHOLHDL, LDLDIRECT in the last 72 hours. Thyroid Function Tests: No  results for input(s): TSH, T4TOTAL, FREET4, T3FREE, THYROIDAB in the last 72 hours. Anemia Panel: No results for input(s): VITAMINB12, FOLATE, FERRITIN, TIBC, IRON, RETICCTPCT in the last 72 hours. Sepsis Labs: Recent Labs  Lab 06/11/19 0003 06/11/19 0321  LATICACIDVEN 4.0* 3.0*    Recent Results (from the past 240 hour(s))  SARS Coronavirus 2 (Performed in [facility] hospital lab)     Status: None   Collection Time: 06/03/19  2:38 PM   Specimen: Nasal Swab  Result Value Ref Range Status   SARS Coronavirus 2 NEGATIVE NEGATIVE Final    Comment: (NOTE) SARS-CoV-2 target nucleic acids are NOT DETECTED. The SARS-CoV-2 RNA is generally detectable in upper and lower respiratory specimens during the acute phase of infection. Negative results do not preclude SARS-CoV-2 infection, do not rule out co-infections with other pathogens, and should not be used as the sole basis for treatment or other patient management decisions. Negative results must be combined with clinical observations, patient history, and epidemiological information. The expected result is Negative. Fact Sheet for Patients: SugarRoll.be Fact Sheet for Healthcare Providers: https://www.woods-mathews.com/ This test is not yet approved or cleared by the [place] FDA and  has been authorized for detection and/or diagnosis of SARS-CoV-2 by FDA under  an Emergency Use Authorization (EUA). This EUA will remain  in effect (meaning this test can be used) for the duration of the COVID-19 declaration under Section 56 4(b)(1) of the Act, 21 U.S.C. section 360bbb-3(b)(1), unless the authorization is terminated or revoked sooner. Performed at [facility], [place] [address], [place], [place] [address]   SARS Coronavirus 2 (CEPHEID - Performed in [facility] hospital lab), Hosp Order     Status: None   Collection Time: 06/10/19 11:59 PM   Specimen: Nasopharyngeal Swab  Result Value  Ref Range Status   SARS Coronavirus 2 NEGATIVE NEGATIVE Final    Comment: (NOTE) If result is NEGATIVE SARS-CoV-2 target nucleic acids are NOT DETECTED. The SARS-CoV-2 RNA is generally detectable in upper and lower  respiratory specimens during the acute phase of infection. The lowest  concentration of SARS-CoV-2 viral copies this assay can detect is 250  copies / mL. A negative result does not preclude SARS-CoV-2 infection  and should not be used as the sole basis for treatment or other  patient management decisions.  A negative result may occur with  improper specimen collection / handling, submission of specimen other  than nasopharyngeal swab, presence of viral mutation(s) within the  areas targeted by this assay, and inadequate number of viral copies  (<250 copies / mL). A negative result must be combined with clinical  observations, patient history, and epidemiological information. If result is POSITIVE SARS-CoV-2 target nucleic acids are DETECTED. The SARS-CoV-2 RNA is generally detectable in upper and lower  respiratory specimens dur ing the acute phase of infection.  Positive  results are indicative of active infection with SARS-CoV-2.  Clinical  correlation with patient history and other diagnostic information is  necessary to determine patient infection status.  Positive results do  not rule out bacterial infection or co-infection with other viruses. If result is PRESUMPTIVE POSTIVE SARS-CoV-2 nucleic acids MAY BE PRESENT.   A presumptive positive result was obtained on the submitted specimen  and confirmed on repeat testing.  While 2019 novel coronavirus  (SARS-CoV-2) nucleic acids may be present in the submitted sample  additional confirmatory testing may be necessary for epidemiological  and / or clinical management purposes  to differentiate between  SARS-CoV-2 and other Sarbecovirus currently known to infect humans.  If clinically indicated additional testing with an  alternate test  methodology [id number]) is advised. The SARS-CoV-2 RNA is generally  detectable in upper and lower respiratory sp ecimens during the acute  phase of infection. The expected result is Negative. Fact Sheet for Patients:  StrictlyIdeas.no Fact Sheet for Healthcare Providers: BankingDealers.co.za This test is not yet approved or cleared by the [place] FDA and has been authorized for detection and/or diagnosis of SARS-CoV-2 by FDA under an Emergency Use Authorization (EUA).  This EUA will remain in effect (meaning this test can be used) for the duration of the COVID-19 declaration under Section 564(b)(1) of the Act, 21 U.S.C. section 360bbb-3(b)(1), unless the authorization is terminated or revoked sooner. Performed at [facility], [address], [place], [place] [address]   Blood Culture (routine x 2)     Status: Abnormal (Preliminary result)   Collection Time: 06/11/19 12:03 AM   Specimen: BLOOD LEFT HAND  Result Value Ref Range Status   Specimen Description   Final    BLOOD LEFT HAND Performed at [facility], [address] [place], [place] [address]    Special Requests   Final    BOTTLES DRAWN AEROBIC AND  ANAEROBIC Blood Culture adequate volume Performed at [place] [place], [place] [address]    Culture  Setup Time (A)  Final    GRAM VARIABLE ROD AEROBIC BOTTLE ONLY Gram Stain Report Called to,Read Back By and Verified With: K ROSE,RN @0506  06/12/19 mkelly CRITICAL RESULT CALLED TO, READ BACK BY AND VERIFIED WITH: PHARMD G COFFEE [id number] MLM    Culture (A)  Final    ACINETOBACTER SPECIES SUSCEPTIBILITIES TO FOLLOW Performed at [facility], [place] [address], [place], [place] [address]    Report Status PENDING  Incomplete  Blood Culture ID Panel (Reflexed)     Status: None   Collection Time: 06/11/19 12:03 AM  Result Value Ref Range Status   Enterococcus species NOT DETECTED NOT  DETECTED Final   Listeria monocytogenes NOT DETECTED NOT DETECTED Final   Staphylococcus species NOT DETECTED NOT DETECTED Final   Staphylococcus aureus (BCID) NOT DETECTED NOT DETECTED Final   Streptococcus species NOT DETECTED NOT DETECTED Final   Streptococcus agalactiae NOT DETECTED NOT DETECTED Final   Streptococcus pneumoniae NOT DETECTED NOT DETECTED Final   Streptococcus pyogenes NOT DETECTED NOT DETECTED Final   Acinetobacter baumannii NOT DETECTED NOT DETECTED Final   Enterobacteriaceae species NOT DETECTED NOT DETECTED Final   Enterobacter cloacae complex NOT DETECTED NOT DETECTED Final   Escherichia coli NOT DETECTED NOT DETECTED Final   Klebsiella oxytoca NOT DETECTED NOT DETECTED Final   Klebsiella pneumoniae NOT DETECTED NOT DETECTED Final   Proteus species NOT DETECTED NOT DETECTED Final   Serratia marcescens NOT DETECTED NOT DETECTED Final   Haemophilus influenzae NOT DETECTED NOT DETECTED Final   Neisseria meningitidis NOT DETECTED NOT DETECTED Final   Pseudomonas aeruginosa NOT DETECTED NOT DETECTED Final   Candida albicans NOT DETECTED NOT DETECTED Final   Candida glabrata NOT DETECTED NOT DETECTED Final   Candida krusei NOT DETECTED NOT DETECTED Final   Candida parapsilosis NOT DETECTED NOT DETECTED Final   Candida tropicalis NOT DETECTED NOT DETECTED Final    Comment: Performed at [facility], [place] [address], [place], [place] [address]  Blood Culture (routine x 2)     Status: None (Preliminary result)   Collection Time: 06/11/19 12:06 AM   Specimen: BLOOD  Result Value Ref Range Status   Specimen Description BLOOD RIGHT ANTECUBITAL  Final   Special Requests   Final    BOTTLES DRAWN AEROBIC AND ANAEROBIC Blood Culture results may not be optimal due to an inadequate volume of blood received in culture bottles   Culture   Final    NO GROWTH 2 DAYS Performed at [facility], [address] [place], [place] [address]    Report Status PENDING  Incomplete   Urine culture     Status: Abnormal (Preliminary result)   Collection Time: 06/11/19 12:39 AM   Specimen: In/Out Cath Urine  Result Value Ref Range Status   Specimen Description   Final    IN/OUT CATH URINE Performed at [facility], [address], [place], [place] [address]    Special Requests   Final    NONE Performed at [facility], [address] [place], [place] [address]    Culture (A)  Final    >=100,000 COLONIES/mL ACINETOBACTER SPECIES 20,000 COLONIES/mL ENTEROBACTER AEROGENES    Report Status PENDING  Incomplete   Organism ID, Bacteria ACINETOBACTER SPECIES (A)  Final      Susceptibility   Acinetobacter species - MIC*    CEFTAZIDIME 4 SENSITIVE  Sensitive     CEFTRIAXONE 16 INTERMEDIATE Intermediate     CIPROFLOXACIN 0.5 SENSITIVE Sensitive     GENTAMICIN <=1 SENSITIVE Sensitive     IMIPENEM <=0.25 SENSITIVE Sensitive     PIP/TAZO <=4 SENSITIVE Sensitive     TRIMETH/SULFA <=20 SENSITIVE Sensitive     CEFEPIME 2 SENSITIVE Sensitive     AMPICILLIN/SULBACTAM <=2 SENSITIVE Sensitive     * >=100,000 COLONIES/mL ACINETOBACTER SPECIES  MRSA PCR Screening     Status: None   Collection Time: 06/11/19  6:26 AM   Specimen: Nasal Mucosa; Nasopharyngeal  Result Value Ref Range Status   MRSA by PCR NEGATIVE NEGATIVE Final    Comment:        The GeneXpert MRSA Assay (FDA approved for NASAL specimens only), is one component of a comprehensive MRSA colonization surveillance program. It is not intended to diagnose MRSA infection nor to guide or monitor treatment for MRSA infections. Performed at [facility], [address] [place],  [address]          Radiology Studies: No results found.      Scheduled Meds: . atorvastatin  40 mg Oral Daily  . insulin aspart  0-9 Units Subcutaneous Q6H  . levETIRAcetam  250 mg Oral BID   Continuous Infusions: . sodium chloride 75 mL/hr at 06/13/19 0320  . meropenem (MERREM) IV 1 g (06/13/19 0612)     LOS: 2 days    Time spent= 35 mins    Thompson Mckim Arsenio Loader, MD [facility]  If 7PM-7AM, please contact night-coverage [email or web address] 06/13/2019, 11:50 AM

## 2019-06-14 DIAGNOSIS — E119 Type 2 diabetes mellitus without complications: Secondary | ICD-10-CM

## 2019-06-14 LAB — BASIC METABOLIC PANEL
Anion gap: 6 (ref 5–15)
BUN: 11 mg/dL (ref 8–23)
CO2: 25 mmol/L (ref 22–32)
Calcium: 8 mg/dL — ABNORMAL LOW (ref 8.9–10.3)
Chloride: 110 mmol/L (ref 98–111)
Creatinine, Ser: 0.74 mg/dL (ref 0.61–1.24)
GFR calc Af Amer: >60 mL/min (ref >60)
GFR calc non Af Amer: >60 mL/min (ref >60)
Glucose, Bld: 126 mg/dL — ABNORMAL HIGH (ref 70–99)
Potassium: 3.6 mmol/L (ref 3.5–5.1)
Sodium: 141 mmol/L (ref 135–145)

## 2019-06-14 LAB — URINE CULTURE: Culture: >=100000 — AB

## 2019-06-14 LAB — CBC
HCT: 39.3 % (ref 39.0–52.0)
Hemoglobin: 12.7 g/dL — ABNORMAL LOW (ref 13.0–17.0)
MCH: 30.2 pg (ref 26.0–34.0)
MCHC: 32.3 g/dL (ref 30.0–36.0)
MCV: 93.6 fL (ref 80.0–100.0)
Platelets: 170 10*3/uL (ref 150–400)
RBC: 4.2 MIL/uL — ABNORMAL LOW (ref 4.22–5.81)
RDW: 13.2 % (ref 11.5–15.5)
WBC: 6.5 10*3/uL (ref 4.0–10.5)
nRBC: 0 % (ref 0.0–0.2)

## 2019-06-14 LAB — GLUCOSE, CAPILLARY: Glucose-Capillary: 112 mg/dL — ABNORMAL HIGH (ref 70–99)

## 2019-06-14 LAB — CULTURE, BLOOD (ROUTINE X 2): Special Requests: ADEQUATE

## 2019-06-14 LAB — MAGNESIUM: Magnesium: 1.7 mg/dL (ref 1.7–2.4)

## 2019-06-14 MED ORDER — CIPROFLOXACIN HCL 500 MG PO TABS: 500.0000 mg | ORAL_TABLET | Freq: Two times a day (BID) | ORAL | 0 refills | Status: AC

## 2019-06-14 NOTE — Discharge Summary (Signed)
Physician Discharge Summary  Arthur Cisneros. ZYS:[id number] DOB: 12/09/1955 DOA: 06/10/2019  PCP: Petra Kuba, MD  Admit date: 06/10/2019 Discharge date: 06/14/2019  Admitted From: Home Disposition: Home  Recommendations for Outpatient Follow-up:  1. Follow up with PCP in 1-2 weeks 2. Please obtain BMP/CBC in one week your next doctors visit.  3. Take oral Cipro for 10 days 4. Has outpatient urology follow-up on 8/14.   Discharge Condition: Stable CODE STATUS: Full code Diet recommendation: Diabetic  Brief/Interim Summary: 63 year old with history of essential hypertension, hyperlipidemia, diabetes mellitus type 2, kidney stone status post stent placement came to the ER with complaints of fevers and chills.  Patient had ureteral stent placed prior to hospitalization but apparently it came out.  During the hospitalization he was found to be septic with CT showing multiple stones in the right ureter.  Patient was taken to the OR for repeat stenting.  Started on broad-spectrum antibiotics.  After reviewing the cultures he was transitioned to IV meropenem and later switched to oral ciprofloxacin for 10 days.  Patient has outpatient follow-up appointment with urology on 8/14.  Also spoke with the urologist on the day of discharge, Dr. Alinda Money will follow him up.  Stable for discharge.     Discharge Diagnoses:  Active Problems:   Sepsis ([place])    Urinary tract infection secondary to the setting of bacteremia and Enterobacter. -  His stent has been exchanged.  We will transition IV meropenem to 10 more days of oral Cipro.  Urology to follow-up patient on 8/14.  Discussed with them, Dr. Raynelle Bring.  Acute kidney injury, stable -Baseline creatinine 0.8.   Component of dehydration and postobstructive nephropathy. -Status post stent placed.  Multiple renal stone causing obstructive uropathy -Status post new stent placement on 7/28. -Supportive care.  History of seizure  disorder -Continue Keppra  Diabetes mellitus type 2 -Resume home meds  Hyperlipidemia -Continue statin   Consultations:  Urology  Subjective: Feels okay, no complaints.  Discharge Exam: Vitals:   06/13/19 2116 06/14/19 0506  BP: (!) 166/100 140/78  Pulse: 72 74  Resp: 17 16  Temp: 99.2 F (37.3 C) 98 F (36.7 C)  SpO2: 95% 95%   Vitals:   06/13/19 1514 06/13/19 1934 06/13/19 2116 06/14/19 0506  BP: (!) 142/77  (!) 166/100 140/78  Pulse: 72  72 74  Resp: 16  17 16   Temp: 98.5 F (36.9 C)  99.2 F (37.3 C) 98 F (36.7 C)  TempSrc: Oral  Oral Oral  SpO2: 96% 92% 95% 95%  Weight:      Height:        General: Pt is alert, awake, not in acute distress Cardiovascular: RRR, S1/S2 +, no rubs, no gallops Respiratory: CTA bilaterally, no wheezing, no rhonchi Abdominal: Soft, NT, ND, bowel sounds + Extremities: no edema, no cyanosis  Discharge Instructions   Allergies as of 06/14/2019      Reactions   Lyrica [pregabalin]    hallucination   Motrin [ibuprofen]    "dr said I can not take it."      Medication List    TAKE these medications   acetaminophen 500 MG tablet Commonly known as: TYLENOL Take by mouth every 6 (six) hours as needed (pain).   atorvastatin 40 MG tablet Commonly known as: LIPITOR Take 40 mg by mouth daily.   ciprofloxacin 500 MG tablet Commonly known as: Cipro Take 1 tablet (500 mg total) by mouth 2 (two) times daily for 10 days.  HYDROcodone-acetaminophen 5-325 MG tablet Commonly known as: NORCO/VICODIN Take 1-2 tablets by mouth every 6 (six) hours as needed.   levETIRAcetam 250 MG tablet Commonly known as: KEPPRA TAKE 1 TABLET BY MOUTH TWICE DAILY   losartan 25 MG tablet Commonly known as: COZAAR Take 25 mg by mouth daily.   metFORMIN 500 MG 24 hr tablet Commonly known as: GLUCOPHAGE-XR Take 500 mg by mouth 2 (two) times a day.   nitroGLYCERIN 0.4 MG SL tablet Commonly known as: NITROSTAT Place 0.4 mg under the  tongue every 5 (five) minutes as needed.   tiZANidine 2 MG tablet Commonly known as: ZANAFLEX TAKE 1 TABLET BY MOUTH AT BEDTIME AS NEEDED. What changed:   how much to take  how to take this  when to take this  reasons to take this      Follow-up Information    Petra Kuba, MD. Schedule an appointment as soon as possible for a visit in 1 week(s).   Specialty: Family Medicine Contact information: [place] [address]  [address] [phone]        Raynelle Bring, MD. Go on 06/28/2019.   Specialty: Urology Why: 10:15 AM Contact information: [address] [place]  [address] [phone]          Allergies  Allergen Reactions  . Lyrica [Pregabalin]     hallucination  . Motrin [Ibuprofen]     "dr said I can not take it."    You were cared for by a hospitalist during your hospital stay. If you have any questions about your discharge medications or the care you received while you were in the hospital after you are discharged, you can call the unit and asked to speak with the hospitalist on call if the hospitalist that took care of you is not available. Once you are discharged, your primary care physician will handle any further medical issues. Please note that no refills for any discharge medications will be authorized once you are discharged, as it is imperative that you return to your primary care physician (or establish a relationship with a primary care physician if you do not have one) for your aftercare needs so that they can reassess your need for medications and monitor your lab values.   Procedures/Studies: Dg Abdomen 1 View  Result Date: 06/11/2019 CLINICAL DATA:  Status post right renal stent EXAM: ABDOMEN - 1 VIEW COMPARISON:  CT abdomen/pelvis dated 05/10/2019 FINDINGS: Nonobstructive bowel gas pattern. No renal stent is visualized. Fiducial markers in the region of the prostate. IMPRESSION: No renal stent is visualized.  Electronically Signed   By: Julian Hy M.D.   On: 06/11/2019 00:27   Dg Retrograde Pyelogram  Result Date: 06/11/2019 CLINICAL DATA:  Renal calculus EXAM: RETROGRADE PYELOGRAM COMPARISON:  CT from the same day FINDINGS: A series of fluoroscopic spot images documents unilateral retrograde pyelography with placement of ureteral stent. Incomplete opacification of the proximal ureter. No definite urothelial lesion identified. Laterality not indicated on images. IMPRESSION: Retrograde pyelogram and ureteral stent placement. Electronically Signed   By: Lucrezia Europe M.D.   On: 06/11/2019 10:39   Dg Chest Portable 1 View  Result Date: 06/11/2019 CLINICAL DATA:  Fever, body aches, altered mental status EXAM: PORTABLE CHEST 1 VIEW COMPARISON:  None. FINDINGS: Low lung volumes with bibasilar atelectasis. No focal consolidation. No pleural effusion or pneumothorax. The heart is normal in size. Postsurgical changes related to prior CABG. Median sternotomy. IMPRESSION: Low lung volumes with bibasilar atelectasis.  Electronically Signed   By: Julian Hy M.D.   On: 06/11/2019 01:39   Dg C-arm 1-60 Min-no Report  Result Date: 06/06/2019 Fluoroscopy was utilized by the requesting physician.  No radiographic interpretation.   Ct Renal Stone Study  Result Date: 06/11/2019 CLINICAL DATA:  Stent placed recently, altered mental status EXAM: CT ABDOMEN AND PELVIS WITHOUT CONTRAST TECHNIQUE: Multidetector CT imaging of the abdomen and pelvis was performed following the standard protocol without IV contrast. COMPARISON:  Radiograph 06/10/2019, CT 05/10/2019 FINDINGS: Lower chest: Lung bases demonstrate no pleural effusion. Partial atelectasis at the bilateral lung bases. Coronary vascular calcification. Ectatic aortic root measuring up to 3.8 cm. Small hiatal hernia. Hepatobiliary: Small calcified gallstones. No focal hepatic abnormality. No biliary dilatation Pancreas: Unremarkable. No pancreatic ductal dilatation  or surrounding inflammatory changes. Spleen: Normal in size without focal abnormality. Adrenals/Urinary Tract: Adrenal glands are normal. Moderate bilateral perinephric fat stranding, increased compared to prior. Numerous stone fragments within the right kidney measuring up to 6 mm, previously noted dominant right renal pelvis stone no longer identified. Mild to moderate right hydronephrosis and hydroureter, secondary to multiple small stones in the distal right ureter ovary 2 cm segment of ureter. Stones measure up to 3 mm in diameter. These are visualized at the mid sacral level. Urinary bladder unremarkable Stomach/Bowel: Stomach is within normal limits. Appendix appears normal. No evidence of bowel wall thickening, distention, or inflammatory changes. Vascular/Lymphatic: Nonaneurysmal aorta. Moderate aortic atherosclerosis. No significantly enlarged lymph nodes Reproductive: Clips at the prostate.  Prostate calcifications. Other: Negative for free air or free fluid Musculoskeletal: No acute or suspicious osseous abnormality. Degenerative changes. IMPRESSION: 1. Mild to moderate right hydronephrosis and hydroureter, secondary to what appears to be multiple adjacent small stones in the distal right ureter, spanning a craniocaudad length of ureter measuring 2 cm with stones measuring up to 3 mm in diameter. These are present at the level of mid sacrum. 2. Multiple stone fragments now visible in the right kidney as opposed to the dominant stone in the pelvis on the prior exam. 3. Increased bilateral perinephric fat stranding 4. Gallstones Electronically Signed   By: Donavan Foil M.D.   On: 06/11/2019 02:58      The results of significant diagnostics from this hospitalization (including imaging, microbiology, ancillary and laboratory) are listed below for reference.     Microbiology: Recent Results (from the past 240 hour(s))  SARS Coronavirus 2 (CEPHEID - Performed in [place] hospital lab), Hosp Order      Status: None   Collection Time: 06/10/19 11:59 PM   Specimen: Nasopharyngeal Swab  Result Value Ref Range Status   SARS Coronavirus 2 NEGATIVE NEGATIVE Final    Comment: (NOTE) If result is NEGATIVE SARS-CoV-2 target nucleic acids are NOT DETECTED. The SARS-CoV-2 RNA is generally detectable in upper and lower  respiratory specimens during the acute phase of infection. The lowest  concentration of SARS-CoV-2 viral copies this assay can detect is 250  copies / mL. A negative result does not preclude SARS-CoV-2 infection  and should not be used as the sole basis for treatment or other  patient management decisions.  A negative result may occur with  improper specimen collection / handling, submission of specimen other  than nasopharyngeal swab, presence of viral mutation(s) within the  areas targeted by this assay, and inadequate number of viral copies  (<250 copies / mL). A negative result must be combined with clinical  observations, patient history, and epidemiological information. If result is POSITIVE  SARS-CoV-2 target nucleic acids are DETECTED. The SARS-CoV-2 RNA is generally detectable in upper and lower  respiratory specimens dur ing the acute phase of infection.  Positive  results are indicative of active infection with SARS-CoV-2.  Clinical  correlation with patient history and other diagnostic information is  necessary to determine patient infection status.  Positive results do  not rule out bacterial infection or co-infection with other viruses. If result is PRESUMPTIVE POSTIVE SARS-CoV-2 nucleic acids MAY BE PRESENT.   A presumptive positive result was obtained on the submitted specimen  and confirmed on repeat testing.  While 2019 novel coronavirus  (SARS-CoV-2) nucleic acids may be present in the submitted sample  additional confirmatory testing may be necessary for epidemiological  and / or clinical management purposes  to differentiate between  SARS-CoV-2 and  other Sarbecovirus currently known to infect humans.  If clinically indicated additional testing with an alternate test  methodology [phone]) is advised. The SARS-CoV-2 RNA is generally  detectable in upper and lower respiratory sp ecimens during the acute  phase of infection. The expected result is Negative. Fact Sheet for Patients:  StrictlyIdeas.no Fact Sheet for Healthcare Providers: BankingDealers.co.za This test is not yet approved or cleared by the [place] FDA and has been authorized for detection and/or diagnosis of SARS-CoV-2 by FDA under an Emergency Use Authorization (EUA).  This EUA will remain in effect (meaning this test can be used) for the duration of the COVID-19 declaration under Section 564(b)(1) of the Act, 21 U.S.C. section 360bbb-3(b)(1), unless the authorization is terminated or revoked sooner. Performed at [facility], [address], [place], [place] [address]   Blood Culture (routine x 2)     Status: Abnormal   Collection Time: 06/11/19 12:03 AM   Specimen: BLOOD LEFT HAND  Result Value Ref Range Status   Specimen Description   Final    BLOOD LEFT HAND Performed at [facility], [address] [place], [place] [address]    Special Requests   Final    BOTTLES DRAWN AEROBIC AND ANAEROBIC Blood Culture adequate volume Performed at [place] [place],  [address]    Culture  Setup Time (A)  Final    GRAM VARIABLE ROD AEROBIC BOTTLE ONLY Gram Stain Report Called to,Read Back By and Verified With: K ROSE,RN @0506  06/12/19 mkelly CRITICAL RESULT CALLED TO, READ BACK BY AND VERIFIED WITH: Merrilee Seashore COFFEE [id number] MLM Performed at [facility], [place] [address], [place], [place] [address]    Culture ACINETOBACTER CALCOACETICUS/BAUMANNII COMPLEX (A)  Final   Report Status 06/14/2019 FINAL  Final   Organism ID, Bacteria ACINETOBACTER CALCOACETICUS/BAUMANNII COMPLEX  Final       Susceptibility   Acinetobacter calcoaceticus/baumannii complex - MIC*    CEFTAZIDIME 4 SENSITIVE Sensitive     CEFTRIAXONE 16 INTERMEDIATE Intermediate     CIPROFLOXACIN 0.5 SENSITIVE Sensitive     GENTAMICIN <=1 SENSITIVE Sensitive     IMIPENEM <=0.25 SENSITIVE Sensitive     PIP/TAZO <=4 SENSITIVE Sensitive     TRIMETH/SULFA <=20 SENSITIVE Sensitive     CEFEPIME 2 SENSITIVE Sensitive     AMPICILLIN/SULBACTAM <=2 SENSITIVE Sensitive     * ACINETOBACTER CALCOACETICUS/BAUMANNII COMPLEX  Blood Culture ID Panel (Reflexed)     Status: None   Collection Time: 06/11/19 12:03 AM  Result Value Ref Range Status   Enterococcus species NOT DETECTED NOT DETECTED Final   Listeria monocytogenes NOT DETECTED NOT DETECTED Final   Staphylococcus species NOT DETECTED  NOT DETECTED Final   Staphylococcus aureus (BCID) NOT DETECTED NOT DETECTED Final   Streptococcus species NOT DETECTED NOT DETECTED Final   Streptococcus agalactiae NOT DETECTED NOT DETECTED Final   Streptococcus pneumoniae NOT DETECTED NOT DETECTED Final   Streptococcus pyogenes NOT DETECTED NOT DETECTED Final   Acinetobacter baumannii NOT DETECTED NOT DETECTED Final   Enterobacteriaceae species NOT DETECTED NOT DETECTED Final   Enterobacter cloacae complex NOT DETECTED NOT DETECTED Final   Escherichia coli NOT DETECTED NOT DETECTED Final   Klebsiella oxytoca NOT DETECTED NOT DETECTED Final   Klebsiella pneumoniae NOT DETECTED NOT DETECTED Final   Proteus species NOT DETECTED NOT DETECTED Final   Serratia marcescens NOT DETECTED NOT DETECTED Final   Haemophilus influenzae NOT DETECTED NOT DETECTED Final   Neisseria meningitidis NOT DETECTED NOT DETECTED Final   Pseudomonas aeruginosa NOT DETECTED NOT DETECTED Final   Candida albicans NOT DETECTED NOT DETECTED Final   Candida glabrata NOT DETECTED NOT DETECTED Final   Candida krusei NOT DETECTED NOT DETECTED Final   Candida parapsilosis NOT DETECTED NOT DETECTED Final   Candida  tropicalis NOT DETECTED NOT DETECTED Final    Comment: Performed at [facility], [place] [address] [place], [place] [address]  Blood Culture (routine x 2)     Status: None (Preliminary result)   Collection Time: 06/11/19 12:06 AM   Specimen: BLOOD  Result Value Ref Range Status   Specimen Description BLOOD RIGHT ANTECUBITAL  Final   Special Requests   Final    BOTTLES DRAWN AEROBIC AND ANAEROBIC Blood Culture results may not be optimal due to an inadequate volume of blood received in culture bottles   Culture   Final    NO GROWTH 3 DAYS Performed at [facility], [address], [place], [place] [address]    Report Status PENDING  Incomplete  Urine culture     Status: Abnormal   Collection Time: 06/11/19 12:39 AM   Specimen: In/Out Cath Urine  Result Value Ref Range Status   Specimen Description   Final    IN/OUT CATH URINE Performed at [facility], [address], [place], [place] [address]    Special Requests   Final    NONE Performed at [facility], [address] [place], [place] [address]    Culture (A)  Final    >=100,000 COLONIES/mL ACINETOBACTER CALCOACETICUS/BAUMANNII COMPLEX 20,000 COLONIES/mL ENTEROBACTER AEROGENES    Report Status 06/14/2019 FINAL  Final   Organism ID, Bacteria ACINETOBACTER CALCOACETICUS/BAUMANNII COMPLEX (A)  Final   Organism ID, Bacteria ENTEROBACTER AEROGENES (A)  Final      Susceptibility   Acinetobacter calcoaceticus/baumannii complex - MIC*    CEFTAZIDIME 4 SENSITIVE Sensitive     CEFTRIAXONE 16 INTERMEDIATE Intermediate     CIPROFLOXACIN 0.5 SENSITIVE Sensitive     GENTAMICIN <=1 SENSITIVE Sensitive     IMIPENEM <=0.25 SENSITIVE Sensitive     PIP/TAZO <=4 SENSITIVE Sensitive     TRIMETH/SULFA <=20 SENSITIVE Sensitive     CEFEPIME 2 SENSITIVE Sensitive     AMPICILLIN/SULBACTAM <=2 SENSITIVE Sensitive     * >=100,000 COLONIES/mL ACINETOBACTER CALCOACETICUS/BAUMANNII COMPLEX   Enterobacter aerogenes - MIC*    CEFAZOLIN >=64  RESISTANT Resistant     CEFTRIAXONE <=1 SENSITIVE Sensitive     CIPROFLOXACIN <=0.25 SENSITIVE Sensitive     GENTAMICIN <=1 SENSITIVE Sensitive     IMIPENEM <=0.25 SENSITIVE Sensitive     NITROFURANTOIN 64 INTERMEDIATE Intermediate     TRIMETH/SULFA <=20 SENSITIVE Sensitive  PIP/TAZO <=4 SENSITIVE Sensitive     * 20,000 COLONIES/mL ENTEROBACTER AEROGENES  MRSA PCR Screening     Status: None   Collection Time: 06/11/19  6:26 AM   Specimen: Nasal Mucosa; Nasopharyngeal  Result Value Ref Range Status   MRSA by PCR NEGATIVE NEGATIVE Final    Comment:        The GeneXpert MRSA Assay (FDA approved for NASAL specimens only), is one component of a comprehensive MRSA colonization surveillance program. It is not intended to diagnose MRSA infection nor to guide or monitor treatment for MRSA infections. Performed at [facility], [address], [place], [place] [address]      Labs: BNP (last 3 results) No results for input(s): BNP in the last 8760 hours. Basic Metabolic Panel: Recent Labs  Lab 06/11/19 0003 06/11/19 0738 06/13/19 0534 06/14/19 0628  NA 136 140 141 141  K 3.4* 4.0 3.4* 3.6  CL 108 108 109 110  CO2 18* 20* 23 25  GLUCOSE 163* 163* 127* 126*  BUN 18 18 17 11   CREATININE 1.30* 1.41* 1.00 0.74  CALCIUM 8.3* 7.7* 7.7* 8.0*  MG  --   --  1.7 1.7   Liver Function Tests: Recent Labs  Lab 06/11/19 0003 06/11/19 0738  AST 21 21  ALT 15 14  ALKPHOS 35* 30*  BILITOT 1.0 1.0  PROT 6.7 6.1*  ALBUMIN 3.6 3.2*   No results for input(s): LIPASE, AMYLASE in the last 168 hours. No results for input(s): AMMONIA in the last 168 hours. CBC: Recent Labs  Lab 06/11/19 0003 06/11/19 0738 06/13/19 0534 06/14/19 0628  WBC 14.5* 15.8* 10.1 6.5  NEUTROABS 13.6*  --   --   --   HGB 15.2 14.1 12.6* 12.7*  HCT 46.9 44.7 38.8* 39.3  MCV 92.0 95.1 93.5 93.6  PLT 182 144* 157 170   Cardiac Enzymes: No results for input(s): CKTOTAL, CKMB, CKMBINDEX, TROPONINI in the  last 168 hours. BNP: Invalid input(s): POCBNP CBG: Recent Labs  Lab 06/13/19 0537 06/13/19 1116 06/13/19 1705 06/13/19 2345 06/14/19 0503  GLUCAP 123* 131* 110* 98 112*   D-Dimer No results for input(s): DDIMER in the last 72 hours. Hgb A1c No results for input(s): HGBA1C in the last 72 hours. Lipid Profile No results for input(s): CHOL, HDL, LDLCALC, TRIG, CHOLHDL, LDLDIRECT in the last 72 hours. Thyroid function studies No results for input(s): TSH, T4TOTAL, T3FREE, THYROIDAB in the last 72 hours.  Invalid input(s): FREET3 Anemia work up No results for input(s): VITAMINB12, FOLATE, FERRITIN, TIBC, IRON, RETICCTPCT in the last 72 hours. Urinalysis    Component Value Date/Time   COLORURINE AMBER (A) 06/11/2019 0039   APPEARANCEUR CLOUDY (A) 06/11/2019 0039   LABSPEC 1.023 06/11/2019 0039   PHURINE 5.0 06/11/2019 0039   GLUCOSEU NEGATIVE 06/11/2019 0039   HGBUR LARGE (A) 06/11/2019 0039   BILIRUBINUR NEGATIVE 06/11/2019 0039   KETONESUR 5 (A) 06/11/2019 0039   PROTEINUR 100 (A) 06/11/2019 0039   UROBILINOGEN 1.0 01/18/2011 1829   NITRITE NEGATIVE 06/11/2019 0039   LEUKOCYTESUR LARGE (A) 06/11/2019 0039   Sepsis Labs Invalid input(s): PROCALCITONIN,  WBC,  LACTICIDVEN Microbiology Recent Results (from the past 240 hour(s))  SARS Coronavirus 2 (CEPHEID - Performed in [place] hospital lab), Hosp Order     Status: None   Collection Time: 06/10/19 11:59 PM   Specimen: Nasopharyngeal Swab  Result Value Ref Range Status   SARS Coronavirus 2 NEGATIVE NEGATIVE Final    Comment: (NOTE) If result is  NEGATIVE SARS-CoV-2 target nucleic acids are NOT DETECTED. The SARS-CoV-2 RNA is generally detectable in upper and lower  respiratory specimens during the acute phase of infection. The lowest  concentration of SARS-CoV-2 viral copies this assay can detect is 250  copies / mL. A negative result does not preclude SARS-CoV-2 infection  and should not be used as the sole basis  for treatment or other  patient management decisions.  A negative result may occur with  improper specimen collection / handling, submission of specimen other  than nasopharyngeal swab, presence of viral mutation(s) within the  areas targeted by this assay, and inadequate number of viral copies  (<250 copies / mL). A negative result must be combined with clinical  observations, patient history, and epidemiological information. If result is POSITIVE SARS-CoV-2 target nucleic acids are DETECTED. The SARS-CoV-2 RNA is generally detectable in upper and lower  respiratory specimens dur ing the acute phase of infection.  Positive  results are indicative of active infection with SARS-CoV-2.  Clinical  correlation with patient history and other diagnostic information is  necessary to determine patient infection status.  Positive results do  not rule out bacterial infection or co-infection with other viruses. If result is PRESUMPTIVE POSTIVE SARS-CoV-2 nucleic acids MAY BE PRESENT.   A presumptive positive result was obtained on the submitted specimen  and confirmed on repeat testing.  While 2019 novel coronavirus  (SARS-CoV-2) nucleic acids may be present in the submitted sample  additional confirmatory testing may be necessary for epidemiological  and / or clinical management purposes  to differentiate between  SARS-CoV-2 and other Sarbecovirus currently known to infect humans.  If clinically indicated additional testing with an alternate test  methodology [phone]) is advised. The SARS-CoV-2 RNA is generally  detectable in upper and lower respiratory sp ecimens during the acute  phase of infection. The expected result is Negative. Fact Sheet for Patients:  StrictlyIdeas.no Fact Sheet for Healthcare Providers: BankingDealers.co.za This test is not yet approved or cleared by the [place] FDA and has been authorized for detection and/or  diagnosis of SARS-CoV-2 by FDA under an Emergency Use Authorization (EUA).  This EUA will remain in effect (meaning this test can be used) for the duration of the COVID-19 declaration under Section 564(b)(1) of the Act, 21 U.S.C. section 360bbb-3(b)(1), unless the authorization is terminated or revoked sooner. Performed at [facility], [address], [place], [place] [address]   Blood Culture (routine x 2)     Status: Abnormal   Collection Time: 06/11/19 12:03 AM   Specimen: BLOOD LEFT HAND  Result Value Ref Range Status   Specimen Description   Final    BLOOD LEFT HAND Performed at [facility], [address] [place], [place] [address]    Special Requests   Final    BOTTLES DRAWN AEROBIC AND ANAEROBIC Blood Culture adequate volume Performed at [place] [place], [place] [address]    Culture  Setup Time (A)  Final    GRAM VARIABLE ROD AEROBIC BOTTLE ONLY Gram Stain Report Called to,Read Back By and Verified With: K ROSE,RN @0506  06/12/19 mkelly CRITICAL RESULT CALLED TO, READ BACK BY AND VERIFIED WITH: Merrilee Seashore COFFEE [id number] MLM Performed at [facility], [place] [address] [place], [place] [address]    Culture ACINETOBACTER CALCOACETICUS/BAUMANNII COMPLEX (A)  Final   Report Status 06/14/2019 FINAL  Final   Organism ID, Bacteria ACINETOBACTER CALCOACETICUS/BAUMANNII COMPLEX  Final      Susceptibility  Acinetobacter calcoaceticus/baumannii complex - MIC*    CEFTAZIDIME 4 SENSITIVE Sensitive     CEFTRIAXONE 16 INTERMEDIATE Intermediate     CIPROFLOXACIN 0.5 SENSITIVE Sensitive     GENTAMICIN <=1 SENSITIVE Sensitive     IMIPENEM <=0.25 SENSITIVE Sensitive     PIP/TAZO <=4 SENSITIVE Sensitive     TRIMETH/SULFA <=20 SENSITIVE Sensitive     CEFEPIME 2 SENSITIVE Sensitive     AMPICILLIN/SULBACTAM <=2 SENSITIVE Sensitive     * ACINETOBACTER CALCOACETICUS/BAUMANNII COMPLEX  Blood Culture ID Panel (Reflexed)     Status: None   Collection Time: 06/11/19  12:03 AM  Result Value Ref Range Status   Enterococcus species NOT DETECTED NOT DETECTED Final   Listeria monocytogenes NOT DETECTED NOT DETECTED Final   Staphylococcus species NOT DETECTED NOT DETECTED Final   Staphylococcus aureus (BCID) NOT DETECTED NOT DETECTED Final   Streptococcus species NOT DETECTED NOT DETECTED Final   Streptococcus agalactiae NOT DETECTED NOT DETECTED Final   Streptococcus pneumoniae NOT DETECTED NOT DETECTED Final   Streptococcus pyogenes NOT DETECTED NOT DETECTED Final   Acinetobacter baumannii NOT DETECTED NOT DETECTED Final   Enterobacteriaceae species NOT DETECTED NOT DETECTED Final   Enterobacter cloacae complex NOT DETECTED NOT DETECTED Final   Escherichia coli NOT DETECTED NOT DETECTED Final   Klebsiella oxytoca NOT DETECTED NOT DETECTED Final   Klebsiella pneumoniae NOT DETECTED NOT DETECTED Final   Proteus species NOT DETECTED NOT DETECTED Final   Serratia marcescens NOT DETECTED NOT DETECTED Final   Haemophilus influenzae NOT DETECTED NOT DETECTED Final   Neisseria meningitidis NOT DETECTED NOT DETECTED Final   Pseudomonas aeruginosa NOT DETECTED NOT DETECTED Final   Candida albicans NOT DETECTED NOT DETECTED Final   Candida glabrata NOT DETECTED NOT DETECTED Final   Candida krusei NOT DETECTED NOT DETECTED Final   Candida parapsilosis NOT DETECTED NOT DETECTED Final   Candida tropicalis NOT DETECTED NOT DETECTED Final    Comment: Performed at [facility], [address], [place], [place] [address]  Blood Culture (routine x 2)     Status: None (Preliminary result)   Collection Time: 06/11/19 12:06 AM   Specimen: BLOOD  Result Value Ref Range Status   Specimen Description BLOOD RIGHT ANTECUBITAL  Final   Special Requests   Final    BOTTLES DRAWN AEROBIC AND ANAEROBIC Blood Culture results may not be optimal due to an inadequate volume of blood received in culture bottles   Culture   Final    NO GROWTH 3 DAYS Performed at [facility], [address], [place], [place] [address]    Report Status PENDING  Incomplete  Urine culture     Status: Abnormal   Collection Time: 06/11/19 12:39 AM   Specimen: In/Out Cath Urine  Result Value Ref Range Status   Specimen Description   Final    IN/OUT CATH URINE Performed at [facility], [address], [place], [place] [address]    Special Requests   Final    NONE Performed at [facility], [address] [place], [place] [address]    Culture (A)  Final    >=100,000 COLONIES/mL ACINETOBACTER CALCOACETICUS/BAUMANNII COMPLEX 20,000 COLONIES/mL ENTEROBACTER AEROGENES    Report Status 06/14/2019 FINAL  Final   Organism ID, Bacteria ACINETOBACTER CALCOACETICUS/BAUMANNII COMPLEX (A)  Final   Organism ID, Bacteria ENTEROBACTER AEROGENES (A)  Final      Susceptibility   Acinetobacter calcoaceticus/baumannii complex - MIC*    CEFTAZIDIME 4 SENSITIVE Sensitive     CEFTRIAXONE  16 INTERMEDIATE Intermediate     CIPROFLOXACIN 0.5 SENSITIVE Sensitive     GENTAMICIN <=1 SENSITIVE Sensitive     IMIPENEM <=0.25 SENSITIVE Sensitive     PIP/TAZO <=4 SENSITIVE Sensitive     TRIMETH/SULFA <=20 SENSITIVE Sensitive     CEFEPIME 2 SENSITIVE Sensitive     AMPICILLIN/SULBACTAM <=2 SENSITIVE Sensitive     * >=100,000 COLONIES/mL ACINETOBACTER CALCOACETICUS/BAUMANNII COMPLEX   Enterobacter aerogenes - MIC*    CEFAZOLIN >=64 RESISTANT Resistant     CEFTRIAXONE <=1 SENSITIVE Sensitive     CIPROFLOXACIN <=0.25 SENSITIVE Sensitive     GENTAMICIN <=1 SENSITIVE Sensitive     IMIPENEM <=0.25 SENSITIVE Sensitive     NITROFURANTOIN 64 INTERMEDIATE Intermediate     TRIMETH/SULFA <=20 SENSITIVE Sensitive     PIP/TAZO <=4 SENSITIVE Sensitive     * 20,000 COLONIES/mL ENTEROBACTER AEROGENES  MRSA PCR Screening     Status: None   Collection Time: 06/11/19  6:26 AM   Specimen: Nasal Mucosa; Nasopharyngeal  Result Value Ref Range Status   MRSA by PCR NEGATIVE NEGATIVE Final    Comment:        The GeneXpert  MRSA Assay (FDA approved for NASAL specimens only), is one component of a comprehensive MRSA colonization surveillance program. It is not intended to diagnose MRSA infection nor to guide or monitor treatment for MRSA infections. Performed at [facility], [address] [place], [place] [address]      Time coordinating discharge:  I have spent 35 minutes face to face with the patient and on the ward discussing the patients care, assessment, plan and disposition with other care givers. >50% of the time was devoted counseling the patient about the risks and benefits of treatment/Discharge disposition and coordinating care.   SIGNED:   Damita Lack, MD  [facility] 06/14/2019, 10:40 AM   If 7PM-7AM, please contact night-coverage [email or web address]

## 2019-06-16 LAB — CULTURE, BLOOD (ROUTINE X 2): Culture: NO GROWTH

## 2019-07-01 ENCOUNTER — Other Ambulatory Visit: Payer: Self-pay | Admitting: Urology

## 2019-07-08 NOTE — Progress Notes (Signed)
ekg 06-03-19 epic  cxr 06-11-19 epic

## 2019-07-08 NOTE — Patient Instructions (Addendum)
YOU NEED TO HAVE A COVID 19 TEST ON_   8-27-20______ @_______ , THIS TEST MUST BE DONE BEFORE SURGERY, COME                          [place] , [address]. ONCE YOUR COVID TEST IS COMPLETED, PLEASE BEGIN THE QUARANTINE INSTRUCTIONS AS OUTLINED IN YOUR HANDOUT.                Eldred Manges.  07/08/2019   Your procedure is scheduled on: 07-15-19  Report to [facility] Main  Entrance              Report to admitting at      [place]. NO VISITORS ARE ALLOWED IN SHORT STAY OR RECOVERY ROOM.   Call this number if you have problems the morning of surgery [phone]    Remember: Do not eat food or drink liquids :After Midnight. BRUSH YOUR TEETH MORNING OF SURGERY AND RINSE YOUR MOUTH OUT, NO CHEWING GUM CANDY OR MINTS.     Take these medicines the morning of surgery with A SIP OF WATER: keppra DO NOT TAKE ANY DIABETIC MEDICATIONS DAY OF YOUR SURGERY                               You may not have any metal on your body including hair pins and              piercings  Do not wear jewelry,  lotions, powders or perfumes, deodorant                        Men may shave face and neck.   Do not bring valuables to the hospital. [place].  Contacts, dentures or bridgework may not be worn into surgery.  [place] - Preparing for Surgery Before surgery, you can play an important role.  Because skin is not sterile, your skin needs to be as free of germs as possible.  You can reduce the number of germs on your skin by washing with CHG (chlorahexidine gluconate) soap before surgery.  CHG is an antiseptic cleaner which kills germs and bonds with the skin to continue killing germs even after washing. Please DO NOT use if you have an allergy to CHG or antibacterial soaps.  If your skin becomes reddened/irritated stop using the CHG and inform your nurse  when you arrive at Short Stay. Do not shave (including legs and underarms) for at least 48 hours prior to the first CHG shower.  You may shave your face/neck. Please follow these instructions carefully:  1.  Shower with CHG Soap the night before surgery and the  morning of Surgery.  2.  If you choose to wash your hair, wash your hair first as usual with your  normal  shampoo.  3.  After you shampoo, rinse your hair and body thoroughly to remove the  shampoo.                           4.  Use CHG as you would any other liquid soap.  You can apply chg directly  to the skin and wash                       Gently with a scrungie or clean washcloth.  5.  Apply the CHG Soap to your body ONLY FROM THE NECK DOWN.   Do not use on face/ open                           Wound or open sores. Avoid contact with eyes, ears mouth and genitals (private parts).                       Wash face,  Genitals (private parts) with your normal soap.             6.  Wash thoroughly, paying special attention to the area where your surgery  will be performed.  7.  Thoroughly rinse your body with warm water from the neck down.  8.  DO NOT shower/wash with your normal soap after using and rinsing off  the CHG Soap.                9.  Pat yourself dry with a clean towel.            10.  Wear clean pajamas.            11.  Place clean sheets on your bed the night of your first shower and do not  sleep with pets. Day of Surgery : Do not apply any lotions/deodorants the morning of surgery.  Please wear clean clothes to the hospital/surgery center.  FAILURE TO FOLLOW THESE INSTRUCTIONS MAY RESULT IN THE CANCELLATION OF YOUR SURGERY PATIENT SIGNATURE_________________________________  NURSE SIGNATURE__________________________________  ________________________________________________________________________ How to Manage Your Diabetes Before and After Surgery  Why is it important to control my blood sugar before and after  surgery? . Improving blood sugar levels before and after surgery helps healing and can limit problems. . A way of improving blood sugar control is eating a healthy diet by: o  Eating less sugar and carbohydrates o  Increasing activity/exercise o  Talking with your doctor about reaching your blood sugar goals . High blood sugars (greater than 180 mg/dL) can raise your risk of infections and slow your recovery, so you will need to focus on controlling your diabetes during the weeks before surgery. . Make sure that the doctor who takes care of your diabetes knows about your planned surgery including the date and location.  How do I manage my blood sugar before surgery? . Check your blood sugar at least 4 times a day, starting 2 days before surgery, to make sure that the level is not too high or low. o Check your blood sugar the morning of your surgery when you wake up and every 2 hours until you get to the Short Stay unit. . If your blood sugar is less than 70 mg/dL, you will need to treat for low blood sugar: o Do not take insulin. o Treat a low blood sugar (less than 70 mg/dL) with  cup of clear juice (cranberry or apple), 4 glucose tablets, OR glucose gel. o Recheck blood sugar in 15 minutes after treatment (to make sure it is greater than 70 mg/dL). If your blood sugar is not greater than 70 mg/dL on recheck, call [phone] for further instructions. . Report your blood sugar to the short stay  nurse when you get to Short Stay.  . If you are admitted to the hospital after surgery: o Your blood sugar will be checked by the staff and you will probably be given insulin after surgery (instead of oral diabetes medicines) to make sure you have good blood sugar levels. o The goal for blood sugar control after surgery is 80-180 mg/dL.   WHAT DO I DO ABOUT MY DIABETES MEDICATION?  Marland Kitchen Do not take oral diabetes medicines (pills) the morning of surgery.       Patients discharged the day of  surgery will not be allowed to drive home. IF YOU ARE HAVING SURGERY AND GOING HOME THE SAME DAY, YOU MUST HAVE AN ADULT TO DRIVE YOU HOME AND BE WITH YOU FOR 24 HOURS. YOU MAY GO HOME BY TAXI OR UBER OR ORTHERWISE, BUT AN ADULT MUST ACCOMPANY YOU HOME AND STAY WITH YOU FOR 24 HOURS.  Name and phone number of your driver:  Special Instructions: N/A              Please read over the following fact sheets you were given: _____________________________________________________________________

## 2019-07-09 ENCOUNTER — Encounter (HOSPITAL_COMMUNITY)
Admission: RE | Admit: 2019-07-09 | Discharge: 2019-07-09 | Disposition: A | Discharge status: Home / Self Care | Payer: Medicare Other | Source: Ambulatory Visit | Attending: Urology | Admitting: Urology

## 2019-07-09 ENCOUNTER — Encounter (HOSPITAL_COMMUNITY): Payer: Self-pay

## 2019-07-09 ENCOUNTER — Other Ambulatory Visit: Payer: Self-pay

## 2019-07-09 DIAGNOSIS — Z01812 Encounter for preprocedural laboratory examination: Secondary | ICD-10-CM | POA: Insufficient documentation

## 2019-07-09 DIAGNOSIS — Z20828 Contact with and (suspected) exposure to other viral communicable diseases: Secondary | ICD-10-CM | POA: Insufficient documentation

## 2019-07-09 DIAGNOSIS — N200 Calculus of kidney: Secondary | ICD-10-CM | POA: Insufficient documentation

## 2019-07-09 HISTORY — DX: Depression, unspecified: F32.A

## 2019-07-09 LAB — BASIC METABOLIC PANEL
Anion gap: 10 (ref 5–15)
BUN: 16 mg/dL (ref 8–23)
CO2: 21 mmol/L — ABNORMAL LOW (ref 22–32)
Calcium: 9.3 mg/dL (ref 8.9–10.3)
Chloride: 108 mmol/L (ref 98–111)
Creatinine, Ser: 0.78 mg/dL (ref 0.61–1.24)
GFR calc Af Amer: >60 mL/min (ref >60)
GFR calc non Af Amer: >60 mL/min (ref >60)
Glucose, Bld: 135 mg/dL — ABNORMAL HIGH (ref 70–99)
Potassium: 3.6 mmol/L (ref 3.5–5.1)
Sodium: 139 mmol/L (ref 135–145)

## 2019-07-09 LAB — CBC
HCT: 44.3 % (ref 39.0–52.0)
Hemoglobin: 14.8 g/dL (ref 13.0–17.0)
MCH: 31 pg (ref 26.0–34.0)
MCHC: 33.4 g/dL (ref 30.0–36.0)
MCV: 92.9 fL (ref 80.0–100.0)
Platelets: 208 10*3/uL (ref 150–400)
RBC: 4.77 MIL/uL (ref 4.22–5.81)
RDW: 13.2 % (ref 11.5–15.5)
WBC: 8 10*3/uL (ref 4.0–10.5)
nRBC: 0 % (ref 0.0–0.2)

## 2019-07-09 LAB — GLUCOSE, CAPILLARY: Glucose-Capillary: 145 mg/dL — ABNORMAL HIGH (ref 70–99)

## 2019-07-09 NOTE — Progress Notes (Signed)
PCP - Petra Kuba, MD Cardiologist - Dr. Kathi Ludwig  Chest x-ray -  06-11-19 epic EKG - 06-03-19 epic Stress Test -  ECHO -  Cardiac Cath -   Sleep Study -  CPAP -   Fasting Blood Sugar - ,120's Checks Blood Sugar _1-2____ times a day  Blood Thinner Instructions: Plavix  Managed by Dr. Dan Humphreys  Stopped 07-09-19  Aspirin Instructions:  Takes 81 mg asa when off of Plavix  Anesthesia review:  CAD,CVA left side weakness. Right arm flaccid  foot drop, CABG x4 vessel, rash bil groin itches and burns.   Patient denies shortness of breath, fever, cough and chest pain at PAT appointment   asymptomatic   Patient verbalized understanding of instructions that were given to them at the PAT appointment. Patient was also instructed that they will need to review over the PAT instructions again at home before surgery.

## 2019-07-09 NOTE — Progress Notes (Signed)
Pt. states he has a red itching and burning rash bil groin. Konrad Felix PA-C aware.

## 2019-07-11 ENCOUNTER — Other Ambulatory Visit: Payer: Self-pay

## 2019-07-11 ENCOUNTER — Other Ambulatory Visit (HOSPITAL_COMMUNITY)
Admission: RE | Admit: 2019-07-11 | Discharge: 2019-07-11 | Disposition: A | Discharge status: Home / Self Care | Payer: Medicare Other | Source: Ambulatory Visit | Attending: Urology | Admitting: Urology

## 2019-07-11 DIAGNOSIS — Z20822 Contact with and (suspected) exposure to covid-19: Secondary | ICD-10-CM

## 2019-07-11 DIAGNOSIS — Z01812 Encounter for preprocedural laboratory examination: Secondary | ICD-10-CM | POA: Diagnosis not present

## 2019-07-11 LAB — SARS CORONAVIRUS 2 (TAT 6-24 HRS): SARS Coronavirus 2: NEGATIVE

## 2019-07-11 NOTE — Progress Notes (Addendum)
Anesthesia Chart Review   Case: [id number] Date/Time: 07/15/19 1100   Procedure: CYSTOSCOPY/URETEROSCOPY/HOLMIUM LASER/STENT PLACEMENT (Right )   Anesthesia type: General   Pre-op diagnosis: RIGHT URETERAL CALCULI   Location: [place] / [facility]   Surgeon: Raynelle Bring, MD      DISCUSSION:62 y.o. former smoker (3.75 pack years, quit 10/19/85) with h/o HLD, CAD (CABG 2000), Sroke 2011 (left sided weakness, drop foot, uses single foot cane, on Plavix), isolated seizure 2011 (remains on Keppra), HTN, DM II, prostate cancer s/p radiation, right renal pelvis calculus scheduled for above procedure 06/06/2019 with Dr. Raynelle Bring.   Nuclear myocardial perfusion study 10/02/15 demonstrated prior myocardial infarction in the inferior and inferolateral walls with no evidence of ischemia. It was deemed a low risk study.  Last seen by Cardiologist, Dr. Bronson Ing, 04/16/2018.  Stable at this visit with no medication changes made.  1 year follow up recommended, scheduled 08/09/2019.  Pt complains of an itching rash to the groin area that started several days ago.  Advised pt to contact PCP.  Surgeon made aware.   S/p cystoscopy with stent placement 7/23//2020 and 06/11/2019 with no anesthesia complications noted.  VS: BP 131/85   Pulse 79   Temp 37.1 C (Oral)   Resp 16   Ht 5\' 9"  (1.753 m)   Wt 90.7 kg   SpO2 99%   BMI 29.53 kg/m   PROVIDERS: Petra Kuba, MD is PCP   Bronson Ing, MD is Cardiologist  LABS: Labs reviewed: Acceptable for surgery. (all labs ordered are listed, but only abnormal results are displayed)  Labs Reviewed  BASIC METABOLIC PANEL - Abnormal; Notable for the following components:      Result Value   CO2 21 (*)    Glucose, Bld 135 (*)    All other components within normal limits  GLUCOSE, CAPILLARY - Abnormal; Notable for the following components:   Glucose-Capillary 145 (*)    All other components within normal limits  CBC      IMAGES:   EKG: 06/03/2019 Rate 65 bpm Normal sinus rhythm   CV: Echo 09/30/15 Study Conclusions  - Left ventricle: The cavity size was normal. Wall thickness was   increased in a pattern of mild LVH. Systolic function was normal.   The estimated ejection fraction was in the range of 55% to 60%.   Images were inadequate for LV wall motion assessment. Doppler   parameters are consistent with abnormal left ventricular   relaxation (grade 1 diastolic dysfunction). - Aortic valve: Mildly calcified annulus. Trileaflet. - Aorta: Mild aortic root dilatation. Aortic root dimension: 41 mm   (ED). - Mitral valve: Calcified annulus. Normal thickness leaflets . - Right ventricle: Systolic function was mildly reduced. Past Medical History:  Diagnosis Date  . Anxiety    pt denies  . Coronary artery disease   . Depression    at times  . Diabetes mellitus without complication ([employer])    type 2  . History of kidney stones   . Hyperlipidemia   . Hypertension   . Prostate cancer ([place]) 05-2015   radiation CC in [place]  . Stroke [facility])    left side weakness with drop foot   2011    Past Surgical History:  Procedure Laterality Date  . CORONARY ARTERY BYPASS GRAFT     4 graft  . CYSTOSCOPY WITH STENT PLACEMENT Right 06/11/2019   Procedure: CYSTOSCOPY WITH STENT PLACEMENT;  Surgeon: Lucas Mallow, MD;  Location: [facility];  Service:  Urology;  Laterality: Right;  . CYSTOSCOPY/URETEROSCOPY/HOLMIUM LASER/STENT PLACEMENT Right 06/06/2019   Procedure: CYSTOSCOPY/URETEROSCOPY/HOLMIUM LASER/STENT PLACEMENT;  Surgeon: Raynelle Bring, MD;  Location: [facility];  Service: Urology;  Laterality: Right;  . PROSTATE BIOPSY    . right hand surgery      MEDICATIONS: . aspirin EC 81 MG tablet  . acetaminophen (TYLENOL) 500 MG tablet  . atorvastatin (LIPITOR) 40 MG tablet  . calcium carbonate (TUMS - DOSED IN MG ELEMENTAL CALCIUM) 500 MG chewable tablet  . clopidogrel (PLAVIX) 75 MG tablet  .  HYDROcodone-acetaminophen (NORCO/VICODIN) 5-325 MG tablet  . levETIRAcetam (KEPPRA) 250 MG tablet  . losartan (COZAAR) 25 MG tablet  . metFORMIN (GLUCOPHAGE-XR) 500 MG 24 hr tablet  . nitroGLYCERIN (NITROSTAT) 0.4 MG SL tablet  . tiZANidine (ZANAFLEX) 2 MG tablet   No current facility-administered medications for this encounter.    Maia Plan [facility] Pre-Surgical Testing [phone] 07/11/19  1:34 PM

## 2019-07-11 NOTE — Anesthesia Preprocedure Evaluation (Addendum)
Anesthesia Evaluation  Patient identified by MRN, date of birth, ID band Patient awake    Reviewed: Allergy & Precautions, NPO status , Patient's Chart, lab work & pertinent test results  History of Anesthesia Complications Negative for: history of anesthetic complications  Airway Mallampati: II  TM Distance: >3 FB Neck ROM: Full    Dental  (+) Edentulous Upper, Edentulous Lower   Pulmonary former smoker,  07/11/2019 SARS coronavirus NEG   breath sounds clear to auscultation       Cardiovascular hypertension, Pt. on medications (-) angina+ CAD and + CABG   Rhythm:Regular Rate:Normal  '16 ECHO: EF 55-60%, mild aortic root dilation, valves OK '16 Stress:  prior myocardial infarction in the inferior and inferolateral walls with no evidence of ischemia   Neuro/Psych  Headaches, Seizures -, Well Controlled,  Anxiety Depression CVA (L sided weakness), Residual Symptoms    GI/Hepatic Neg liver ROS, GERD  Controlled,  Endo/Other  diabetes (glu 102), Oral Hypoglycemic Agents  Renal/GU stones   Prostate cancer    Musculoskeletal   Abdominal   Peds  Hematology negative hematology ROS (+)   Anesthesia Other Findings   Reproductive/Obstetrics                           Anesthesia Physical Anesthesia Plan  ASA: III  Anesthesia Plan: General   Post-op Pain Management:    Induction: Intravenous  PONV Risk Score and Plan: 2 and Treatment may vary due to age or medical condition  Airway Management Planned: LMA  Additional Equipment:   Intra-op Plan:   Post-operative Plan:   Informed Consent: I have reviewed the patients History and Physical, chart, labs and discussed the procedure including the risks, benefits and alternatives for the proposed anesthesia with the patient or authorized representative who has indicated his/her understanding and acceptance.       Plan Discussed with: CRNA and  Surgeon  Anesthesia Plan Comments: (See PAT note 07/09/2019, Konrad Felix, PA-C)      Anesthesia Quick Evaluation

## 2019-07-12 NOTE — H&P (Signed)
Office Visit Report     06/28/2019   --------------------------------------------------------------------------------   Arthur Cisneros  MRN: [id number]  DOB: 1956-06-26, 63 year old Male  SSN: [id number]   PRIMARY CARE:  Petra Kuba, MD  REFERRING:  Raynelle Bring, Eduardo Osier  PROVIDER:  Raynelle Bring, M.D.  LOCATION:  [facility] [phone]     --------------------------------------------------------------------------------   CC/HPI: 1. Locally advanced prostate cancer  2. Right renal calculus   Arthur Cisneros returns today after having undergone right ureteroscopic laser lithotripsy of his large right renal pelvic stone. His procedure was uncomplicated and he did subsequently remove his stent as instructed. Unfortunately, he developed recurrent obstruction with multiple stone fragments and developed a renal infection with sepsis requiring hospitalization at [facility]. He was treated with appropriate antibiotic therapy and he underwent right ureteral stent placement at the time of his hospital presentation. He has now completed his antibiotics and follows up today with a KUB x-ray. He does have expected stent symptoms including urinary urgency and frequency as well as intermittent lower abdominal discomfort. He denies any gross hematuria. He denies any recent fever.     ALLERGIES: No Allergies    MEDICATIONS: Hydrocodone-Acetaminophen 5 mg-325 mg tablet 1-2 tablet PO Q 6 H prn  Sildenafil Citrate 100 mg tablet 1 tablet PO prn as directed  Levetiracetam 250 mg tablet Oral  Lipitor 40 mg tablet Oral  Losartan Potassium 25 mg tablet Oral  Nitrostat 0.4 mg tablet, sublingual Sublingual  Plavix 75 mg tablet Oral  Voltaren 75 mg tablet, delayed release Oral     GU PSH: Ureteroscopic laser litho, Right - 06/06/2019       PSH Notes: Coronary Artery Four Or More Arterial Bypass Grafts   NON-GU PSH: Cabg; Arterial; Four Or More - 2016     GU PMH: Flank  Pain - 05/10/2019 Microscopic hematuria - 05/10/2019 Renal calculus - 05/10/2019 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 Clyde Lundborg and Dr. Tammi Klippel. He saw me initially in consultation in September 2016 and re-established care with me in March 2018 to complete his care after Dr. Exie Parody left his practice.   His past medical history is significant for a prior CVA managed with Plavix and CAD s/p CABG. He does take daily nitrate medication. He does not regularly follow with a cardiologist. He does have resultant severe deficits in his left upper and left lower extremity. He is ambulatory with a walker.   TNM stage: cT3a N0 M0  PSA: 25.7  Gleason score: 4+4=8  Biopsy (05/25/15 - read by Dr. Cyndie Chime, Acc # [id number]): 10/12 cores positive  Left: L lateral apex (62%, 3+3=6, PNI), L apex (53%, 3+3=6, PNI), L lateral mid (60%, 3+3=6, PNI), L mid (100%, 3+3=6)  Right: R apex (93%, 4+4=8), R lateral apex (67%, 4+4=8, PNI), R mid (50%, 4+4=8), R lateral mid (59%, 4+4=8, PNI), R base (85%, 4+4=8, PNI), R lateral base (92%, 4+4=8, PNI)  Prostate volume: 80 cc   2) Urolithiasis: He has history of recurrent urolithiasis.   Jul 2020: R ureteroscopic laser lithotripsy 1.5 cm right renal pelvic stone  Jul 2020: Stent fell out early and required replacement       NON-GU  PMH: Anxiety, Anxiety - 2016 Arthritis Depression Hypercholesterolemia Hypertension Seizure disorder Stroke/TIA    FAMILY HISTORY: Myocardial Infarction - Runs In Family Strokes - Runs In Family   SOCIAL HISTORY: Marital Status: Married Preferred Language: English; Ethnicity: Not Hispanic Or Latino; Race: White Current Smoking Status: Patient has never smoked.   Tobacco Use  Assessment Completed: Used Tobacco in last 30 days? Does not drink anymore.  Drinks 1 caffeinated drink per day.     Notes: Caffeine use, Never a smoker, Alcohol use   REVIEW OF SYSTEMS:    GU Review Male:   Patient reports leakage of urine. Patient denies frequent urination, hard to postpone urination, burning/ pain with urination, get up at night to urinate, stream starts and stops, trouble starting your streams, and have to strain to urinate .  Gastrointestinal (Lower):   Patient denies diarrhea and constipation.  Gastrointestinal (Upper):   Patient denies nausea and vomiting.  Constitutional:   Patient denies fatigue, weight loss, fever, and night sweats.  Skin:   Patient denies skin rash/ lesion and itching.  Eyes:   Patient denies blurred vision and double vision.  Ears/ Nose/ Throat:   Patient denies sore throat and sinus problems.  Hematologic/Lymphatic:   Patient denies swollen glands and easy bruising.  Cardiovascular:   Patient denies leg swelling and chest pains.  Respiratory:   Patient denies cough and shortness of breath.  Endocrine:   Patient denies excessive thirst.  Musculoskeletal:   Patient denies back pain and joint pain.  Neurological:   Patient denies headaches and dizziness.  Psychologic:   Patient denies depression and anxiety.   Notes: He    VITAL SIGNS:      06/28/2019 10:52 AM  Weight 190 lb / 86.18 kg  Height 69 in / 175.26 cm  BP 130/79 mmHg  Pulse 76 /min  Temperature 97.7 F / 36.5 C  BMI 28.1 kg/m   MULTI-SYSTEM PHYSICAL EXAMINATION:    Constitutional: Well-nourished. No physical deformities. Normally developed. Good grooming.  Respiratory: No labored breathing, no use of accessory muscles. Clear bilaterally  Cardiovascular: Normal temperature, normal extremity pulses, no swelling, no varicosities. . Regular rate and rhythm.  Gastrointestinal: No mass, no tenderness, no rigidity, non obese abdomen.      PAST DATA REVIEWED:  Source Of History:   Patient  Urine Test Review:   Urinalysis  X-Ray Review: KUB: Reviewed Films.     05/14/19 10/19/18 02/02/18 08/02/17 01/25/17  PSA  Total PSA 0.138 ng/mL 0.10 ng/mL 0.050 ng/mL <0.015 ng/dL < 0.015 ng/dl    02/02/18 08/02/17  Hormones  Testosterone, Total 276.1 ng/dL <10 ng/dL   Notes:                     I independently reviewed his KUB x-ray. This demonstrates right ureteral stent to be in proper position. There are noted to be calcifications in the lower mid ureter next to the stent consistent with the calculi noted on his CT scan in late July.   PROCEDURES:         KUB - [id number]  A single view of the abdomen is obtained.      Patient confirmed No Neulasta OnPro Device.            Urinalysis w/Scope Dipstick Dipstick Cont'd Micro  Color: Amber Bilirubin: Neg mg/dL WBC/hpf: NS (Not Seen)  Appearance: Cloudy Ketones: Neg mg/dL RBC/hpf: >60/hpf  Specific Gravity: 1.025 Blood: 3+ ery/uL Bacteria: Rare (0-9/hpf)  pH: 5.5 Protein: 2+  mg/dL Cystals: NS (Not Seen)  Glucose: Neg mg/dL Urobilinogen: 0.2 mg/dL Casts: Granular    Nitrites: Neg Trichomonas: Not Present    Leukocyte Esterase: 1+ leu/uL Mucous: Present      Epithelial Cells: 0 - 5/hpf      Yeast: NS (Not Seen)      Sperm: Not Present    ASSESSMENT:      ICD-10 Details  1 GU:   Renal calculus - N20.0   2   Ureteral calculus - N20.1    PLAN:           Orders Labs Urine Culture          Schedule Return Visit/Planned Activity: Other See Visit Notes             Note: Will call to schedule surgery.          Document Letter(s):  Created for Patient: Clinical Summary         Notes:   1. Right ureteral calculi: We reviewed options including stent removal versus proceeding with ureteroscopic treatment of his remaining stone fragments. Considering his recent septic episode, I have recommended that he proceed with ureteroscopic removal for definitive treatment of his obstruction. He is in agreement. His urine will be  cultured today. He will be scheduled for cystoscopy with right ureteroscopy with possible laser lithotripsy and stone removal. If his procedure is uneventful, he will have his ureteral stent removed at that time. We have reviewed the potential risks, complications, and expected recovery process of this procedure and he does give informed consent. He will plan to discontinue Plavix 5 days preoperatively but will continue aspirin 81 mg.   2. Locally advanced prostate cancer: He is scheduled to follow-up in January for routine surveillance.   Cc: Dr. Sena Hitch         Next Appointment:      Next Appointment: 07/17/2019 12:15 PM    Appointment Type: Renal Ultrasound    Location: [facility] [phone]    Provider: Radiology Rm1 Radiology Rm 1    Reason for Visit: PO 6 WEEKS WITH RIGHT RENAL US AND UA      * Signed by Raynelle Bring, M.D. on 06/29/19 at 11:47 AM (EDT)*

## 2019-07-15 ENCOUNTER — Ambulatory Visit (HOSPITAL_COMMUNITY): Payer: Medicare Other

## 2019-07-15 ENCOUNTER — Ambulatory Visit (HOSPITAL_COMMUNITY)
Admission: RE | Admit: 2019-07-15 | Discharge: 2019-07-15 | Disposition: A | Discharge status: Home / Self Care | Payer: Medicare Other | Attending: Urology | Admitting: Urology

## 2019-07-15 ENCOUNTER — Ambulatory Visit (HOSPITAL_COMMUNITY): Payer: Medicare Other | Admitting: Anesthesiology

## 2019-07-15 ENCOUNTER — Encounter (HOSPITAL_COMMUNITY): Payer: Self-pay | Admitting: *Deleted

## 2019-07-15 ENCOUNTER — Encounter (HOSPITAL_COMMUNITY)
Admission: RE | Disposition: A | Discharge status: Home / Self Care | Payer: Self-pay | Source: Home / Self Care | Attending: Urology

## 2019-07-15 ENCOUNTER — Other Ambulatory Visit: Payer: Self-pay

## 2019-07-15 ENCOUNTER — Ambulatory Visit (HOSPITAL_COMMUNITY): Payer: Medicare Other | Admitting: Physician Assistant

## 2019-07-15 DIAGNOSIS — C61 Malignant neoplasm of prostate: Secondary | ICD-10-CM | POA: Insufficient documentation

## 2019-07-15 DIAGNOSIS — N201 Calculus of ureter: Secondary | ICD-10-CM | POA: Diagnosis present

## 2019-07-15 DIAGNOSIS — Z8673 Personal history of transient ischemic attack (TIA), and cerebral infarction without residual deficits: Secondary | ICD-10-CM | POA: Insufficient documentation

## 2019-07-15 DIAGNOSIS — E7800 Pure hypercholesterolemia, unspecified: Secondary | ICD-10-CM | POA: Diagnosis not present

## 2019-07-15 DIAGNOSIS — Z79899 Other long term (current) drug therapy: Secondary | ICD-10-CM | POA: Insufficient documentation

## 2019-07-15 DIAGNOSIS — G40909 Epilepsy, unspecified, not intractable, without status epilepticus: Secondary | ICD-10-CM | POA: Insufficient documentation

## 2019-07-15 DIAGNOSIS — Z7902 Long term (current) use of antithrombotics/antiplatelets: Secondary | ICD-10-CM | POA: Insufficient documentation

## 2019-07-15 DIAGNOSIS — M1990 Unspecified osteoarthritis, unspecified site: Secondary | ICD-10-CM | POA: Diagnosis not present

## 2019-07-15 DIAGNOSIS — N521 Erectile dysfunction due to diseases classified elsewhere: Secondary | ICD-10-CM | POA: Diagnosis not present

## 2019-07-15 DIAGNOSIS — I2510 Atherosclerotic heart disease of native coronary artery without angina pectoris: Secondary | ICD-10-CM | POA: Insufficient documentation

## 2019-07-15 DIAGNOSIS — Z951 Presence of aortocoronary bypass graft: Secondary | ICD-10-CM | POA: Diagnosis not present

## 2019-07-15 DIAGNOSIS — I10 Essential (primary) hypertension: Secondary | ICD-10-CM | POA: Diagnosis not present

## 2019-07-15 HISTORY — PX: CYSTOSCOPY/URETEROSCOPY/HOLMIUM LASER/STENT PLACEMENT: SHX6546

## 2019-07-15 LAB — GLUCOSE, CAPILLARY
Glucose-Capillary: 102 mg/dL — ABNORMAL HIGH (ref 70–99)
Glucose-Capillary: 127 mg/dL — ABNORMAL HIGH (ref 70–99)

## 2019-07-15 SURGERY — CYSTOSCOPY/URETEROSCOPY/HOLMIUM LASER/STENT PLACEMENT
Anesthesia: General | Site: Bladder | Laterality: Right

## 2019-07-15 MED ORDER — FENTANYL CITRATE (PF) 100 MCG/2ML IJ SOLN
INTRAMUSCULAR | Status: AC
  Filled 2019-07-15: qty 2

## 2019-07-15 MED ORDER — LIDOCAINE 2% (20 MG/ML) 5 ML SYRINGE
INTRAMUSCULAR | Status: AC
  Filled 2019-07-15: qty 5

## 2019-07-15 MED ORDER — 0.9 % SODIUM CHLORIDE (POUR BTL) OPTIME
TOPICAL | Status: DC | PRN
  Administered 2019-07-15: 11:00:00 1000 mL

## 2019-07-15 MED ORDER — TRAMADOL HCL 50 MG PO TABS: 50.0000 mg | ORAL_TABLET | Freq: Four times a day (QID) | ORAL | 0 refills | Status: DC | PRN

## 2019-07-15 MED ORDER — PROMETHAZINE HCL 25 MG/ML IJ SOLN: 6.2500 mg | INTRAMUSCULAR | Status: DC | PRN

## 2019-07-15 MED ORDER — ONDANSETRON HCL 4 MG/2ML IJ SOLN
INTRAMUSCULAR | Status: DC | PRN
  Administered 2019-07-15: 4 mg via INTRAVENOUS

## 2019-07-15 MED ORDER — MEPERIDINE HCL 50 MG/ML IJ SOLN: 6.2500 mg | INTRAMUSCULAR | Status: DC | PRN

## 2019-07-15 MED ORDER — SODIUM CHLORIDE 0.9 % IR SOLN
Status: DC | PRN
  Administered 2019-07-15: 3000 mL

## 2019-07-15 MED ORDER — ONDANSETRON HCL 4 MG/2ML IJ SOLN
INTRAMUSCULAR | Status: AC
  Filled 2019-07-15: qty 2

## 2019-07-15 MED ORDER — BELLADONNA ALKALOIDS-OPIUM 16.2-30 MG RE SUPP
RECTAL | Status: AC
  Filled 2019-07-15: qty 1

## 2019-07-15 MED ORDER — FENTANYL CITRATE (PF) 100 MCG/2ML IJ SOLN
25.0000 ug | INTRAMUSCULAR | Status: DC | PRN
  Administered 2019-07-15 (×3): 50 ug via INTRAVENOUS

## 2019-07-15 MED ORDER — MIDAZOLAM HCL 2 MG/2ML IJ SOLN
INTRAMUSCULAR | Status: AC
  Filled 2019-07-15: qty 2

## 2019-07-15 MED ORDER — CEFAZOLIN SODIUM-DEXTROSE 2-4 GM/100ML-% IV SOLN
2.0000 g | Freq: Once | INTRAVENOUS | Status: AC
  Administered 2019-07-15: 2 g via INTRAVENOUS
  Filled 2019-07-15: qty 100

## 2019-07-15 MED ORDER — LIDOCAINE 2% (20 MG/ML) 5 ML SYRINGE
INTRAMUSCULAR | Status: DC | PRN
  Administered 2019-07-15: 60 mg via INTRAVENOUS

## 2019-07-15 MED ORDER — PROPOFOL 10 MG/ML IV BOLUS
INTRAVENOUS | Status: DC | PRN
  Administered 2019-07-15: 50 mg via INTRAVENOUS
  Administered 2019-07-15: 150 mg via INTRAVENOUS
  Administered 2019-07-15: 50 mg via INTRAVENOUS

## 2019-07-15 MED ORDER — SULFAMETHOXAZOLE-TRIMETHOPRIM 800-160 MG PO TABS: 1.0000 | ORAL_TABLET | Freq: Two times a day (BID) | ORAL | 0 refills | Status: DC

## 2019-07-15 MED ORDER — DEXAMETHASONE SODIUM PHOSPHATE 10 MG/ML IJ SOLN
INTRAMUSCULAR | Status: AC
  Filled 2019-07-15: qty 1

## 2019-07-15 MED ORDER — FENTANYL CITRATE (PF) 100 MCG/2ML IJ SOLN
INTRAMUSCULAR | Status: DC | PRN
  Administered 2019-07-15 (×2): 25 ug via INTRAVENOUS
  Administered 2019-07-15: 50 ug via INTRAVENOUS

## 2019-07-15 MED ORDER — LACTATED RINGERS IV SOLN
INTRAVENOUS | Status: DC
  Administered 2019-07-15: 10:00:00 via INTRAVENOUS

## 2019-07-15 MED ORDER — LIDOCAINE HCL URETHRAL/MUCOSAL 2 % EX GEL
CUTANEOUS | Status: AC
  Filled 2019-07-15: qty 5

## 2019-07-15 MED ORDER — MIDAZOLAM HCL 5 MG/5ML IJ SOLN
INTRAMUSCULAR | Status: DC | PRN
  Administered 2019-07-15: 0.5 mg via INTRAVENOUS

## 2019-07-15 MED ORDER — INDIGOTINDISULFONATE SODIUM 8 MG/ML IJ SOLN
INTRAMUSCULAR | Status: AC
  Filled 2019-07-15: qty 5

## 2019-07-15 MED ORDER — IOHEXOL 300 MG/ML  SOLN
INTRAMUSCULAR | Status: DC | PRN
  Administered 2019-07-15: 11:00:00 50 mL

## 2019-07-15 MED ORDER — MIDAZOLAM HCL 2 MG/2ML IJ SOLN: 0.5000 mg | Freq: Once | INTRAMUSCULAR | Status: DC | PRN

## 2019-07-15 MED ORDER — DEXAMETHASONE SODIUM PHOSPHATE 10 MG/ML IJ SOLN
INTRAMUSCULAR | Status: DC | PRN
  Administered 2019-07-15: 5 mg via INTRAVENOUS

## 2019-07-15 MED ORDER — PROPOFOL 10 MG/ML IV BOLUS
INTRAVENOUS | Status: AC
  Filled 2019-07-15: qty 20

## 2019-07-15 SURGICAL SUPPLY — 20 items
BAG URO CATCHER STRL LF (MISCELLANEOUS) ×3 IMPLANT
BASKET ZERO TIP NITINOL 2.4FR (BASKET) ×2 IMPLANT
CATH INTERMIT  6FR 70CM (CATHETERS) ×2 IMPLANT
CLOTH BEACON ORANGE TIMEOUT ST (SAFETY) ×3 IMPLANT
COVER WAND RF STERILE (DRAPES) IMPLANT
FIBER LASER FLEXIVA 365 (UROLOGICAL SUPPLIES) IMPLANT
FIBER LASER TRAC TIP (UROLOGICAL SUPPLIES) IMPLANT
GLOVE BIOGEL M STRL SZ7.5 (GLOVE) ×3 IMPLANT
GOWN STRL REUS W/TWL LRG LVL3 (GOWN DISPOSABLE) ×6 IMPLANT
GUIDEWIRE ANG ZIPWIRE 038X150 (WIRE) ×2 IMPLANT
GUIDEWIRE STR DUAL SENSOR (WIRE) ×3 IMPLANT
IV NS 1000ML (IV SOLUTION) ×2
IV NS 1000ML BAXH (IV SOLUTION) ×1 IMPLANT
KIT TURNOVER KIT A (KITS) IMPLANT
MANIFOLD NEPTUNE II (INSTRUMENTS) ×3 IMPLANT
PACK CYSTO (CUSTOM PROCEDURE TRAY) ×3 IMPLANT
SHEATH URETERAL 12FRX35CM (MISCELLANEOUS) ×2 IMPLANT
TUBING CONNECTING 10 (TUBING) ×2 IMPLANT
TUBING CONNECTING 10' (TUBING) ×1
TUBING UROLOGY SET (TUBING) ×3 IMPLANT

## 2019-07-15 NOTE — Transfer of Care (Signed)
Immediate Anesthesia Transfer of Care Note  Patient: Arthur Cisneros.  Procedure(s) Performed: CYSTOSCOPY/URETEROSCOPY/STENT PLACEMENT (Right Bladder)  Patient Location: PACU  Anesthesia Type:General  Level of Consciousness: awake  Airway & Oxygen Therapy: Patient Spontanous Breathing and Patient connected to face mask oxygen  Post-op Assessment: Report given to RN and Post -op Vital signs reviewed and stable  Post vital signs: Reviewed and stable  Last Vitals:  Vitals Value Taken Time  BP 162/108 07/15/19 1147  Temp    Pulse 66 07/15/19 1149  Resp 18 07/15/19 1149  SpO2 100 % 07/15/19 1149  Vitals shown include unvalidated device data.  Last Pain:  Vitals:   07/15/19 0926  TempSrc:   PainSc: 0-No pain         Complications: No apparent anesthesia complications

## 2019-07-15 NOTE — Anesthesia Postprocedure Evaluation (Signed)
Anesthesia Post Note  Patient: Arthur Cisneros.  Procedure(s) Performed: CYSTOSCOPY/URETEROSCOPY/STENT PLACEMENT (Right Bladder)     Patient location during evaluation: PACU Anesthesia Type: General Level of consciousness: sedated, oriented and patient cooperative Pain management: pain level controlled Vital Signs Assessment: post-procedure vital signs reviewed and stable Respiratory status: spontaneous breathing, nonlabored ventilation and respiratory function stable Cardiovascular status: blood pressure returned to baseline and stable Postop Assessment: no apparent nausea or vomiting Anesthetic complications: no    Last Vitals:  Vitals:   07/15/19 1200 07/15/19 1215  BP: (!) 170/100 (!) 149/95  Pulse: 70 69  Resp: 19 17  Temp:    SpO2: 100% 97%    Last Pain:  Vitals:   07/15/19 1215  TempSrc:   PainSc: Asleep                 Meggie Laseter,E. Logyn Dedominicis

## 2019-07-15 NOTE — Interval H&P Note (Signed)
History and Physical Interval Note:  07/15/2019 10:17 AM  Arthur Cisneros.  has presented today for surgery, with the diagnosis of RIGHT URETERAL CALCULI.  The various methods of treatment have been discussed with the patient and family. After consideration of risks, benefits and other options for treatment, the patient has consented to  Procedure(s): CYSTOSCOPY/URETEROSCOPY/HOLMIUM LASER/STENT PLACEMENT (Right) as a surgical intervention.  The patient's history has been reviewed, patient examined, no change in status, stable for surgery.  I have reviewed the patient's chart and labs.  Questions were answered to the patient's satisfaction.     [employer]

## 2019-07-15 NOTE — Anesthesia Procedure Notes (Addendum)
Procedure Name: LMA Insertion Date/Time: 07/15/2019 10:50 AM Performed by: West Pugh, CRNA Pre-anesthesia Checklist: Patient identified, Suction available, Patient being monitored and Emergency Drugs available Patient Re-evaluated:Patient Re-evaluated prior to induction Oxygen Delivery Method: Circle system utilized Preoxygenation: Pre-oxygenation with 100% oxygen Induction Type: IV induction LMA: LMA inserted LMA Size: 4.0 Number of attempts: 1 Placement Confirmation: positive ETCO2,  CO2 detector and breath sounds checked- equal and bilateral Tube secured with: Tape Dental Injury: Teeth and Oropharynx as per pre-operative assessment

## 2019-07-15 NOTE — Op Note (Signed)
Preoperative diagnosis: Right ureteral calculi  Postoperative diagnosis: Right ureteral calculi  Procedure:  1. Cystoscopy 2. Right ureteroscopy and stone removal 3. Right ureteral stent placement (6 x 26 with string)  Surgeon: Roxy Horseman, Brooke Bonito. M.D.  Anesthesia: General  Complications: None  Intraoperative findings: [name].  EBL: Minimal   Indication: Arthur Cisneros. is a 63 y.o. year old patient with urolithiasis.  He recently underwent right ureteroscopic laser lithotripsy for large burden right renal stones.  He subsequently developed obstruction and infection from stone fragments and underwent stent placement and antibiotic therapy.  He presents today to remove any remaining fragments. After reviewing the management options for treatment, the patient elected to proceed with the above surgical procedure(s). We have discussed the potential benefits and risks of the procedure, side effects of the proposed treatment, the likelihood of the patient achieving the goals of the procedure, and any potential problems that might occur during the procedure or recuperation. Informed consent has been obtained.  Description of procedure:  The patient was taken to the operating room and general anesthesia was induced.  The patient was placed in the dorsal lithotomy position, prepped and draped in the usual sterile fashion, and preoperative antibiotics were administered. A preoperative time-out was performed.   Cystourethroscopy was performed.  The patient's urethra was examined and was normal. The bladder was then systematically examined in its entirety. There was no evidence for any bladder tumors, stones, or other mucosal pathology. The indwelling right ureteral stent was identified and was severely encrusted.  An initial attempt to removed the stent was unsuccessful.  I inserted an open ended stent next to the stent and  put up a 0.38 glide wire and up into the renal pelvis.  With gentle pressure, I was able to remove the stent.  I then removed the glide wire and replaced it with a 0.38 sensor guidewire that was advanced up the right ureter into the renal pelvis under fluoroscopic guidance. The 6 Fr semirigid ureteroscope was then advanced into the ureter next to the guidewire and multiple small stone fragments were identified.  All stones were then removed from the ureter with a zero tip nitinol basket.  Reinspection of the ureter revealed no remaining visible stones or fragments.  I examined the entire ureter with a flexible ureteroscope before finishing the procedure to ensure there were no ureteral stone fragments.  The wire was then backloaded through the cystoscope and a ureteral stent was advance over the wire using Seldinger technique.  The stent was positioned appropriately under fluoroscopic and cystoscopic guidance.  The wire was then removed with an adequate stent curl noted in the renal pelvis as well as in the bladder. A string tether was left in place.  The bladder was then emptied and the procedure ended.  The patient appeared to tolerate the procedure well and without complications.  The patient was able to be awakened and transferred to the recovery unit in satisfactory condition.

## 2019-07-15 NOTE — Discharge Instructions (Addendum)
1. You may see some blood in the urine and may have some burning with urination for 48-72 hours. You also may notice that you have to urinate more frequently or urgently after your procedure which is normal.  2. You should call should you develop an inability urinate, fever > 101, persistent nausea and vomiting that prevents you from eating or drinking to stay hydrated.  3. If you have a stent, you will likely urinate more frequently and urgently until the stent is removed and you may experience some discomfort/pain in the lower abdomen and flank especially when urinating. You may take pain medication prescribed to you if needed for pain. You may also intermittently have blood in the urine until the stent is removed. 4. You may remove your stent on FRIDAY morning.  Simply pull the string that is taped to your body and the stent will easily come out.  This may be best done in the shower as some urine may come out with the stent.  Usually you will feel relief once the stent is removed, but occasionally patients can develop pain due to residual swelling of the ureter that may temporarily obstruct the kidney.  This can be managed by taking pain medication and it will typically resolve with time.  Please do not hesitate to call if you have pain that is not controlled with your pain medication or does not improved within 24-48 hours.

## 2019-07-16 ENCOUNTER — Encounter (HOSPITAL_COMMUNITY): Payer: Self-pay | Admitting: Urology

## 2019-08-09 ENCOUNTER — Encounter: Payer: Self-pay | Admitting: Cardiovascular Disease

## 2019-08-09 ENCOUNTER — Ambulatory Visit (INDEPENDENT_AMBULATORY_CARE_PROVIDER_SITE_OTHER): Payer: Medicare Other | Admitting: Cardiovascular Disease

## 2019-08-09 ENCOUNTER — Other Ambulatory Visit: Payer: Self-pay

## 2019-08-09 VITALS — BP 159/96 | HR 67 | Ht 69.0 in | Wt 198.2 lb

## 2019-08-09 DIAGNOSIS — I10 Essential (primary) hypertension: Secondary | ICD-10-CM

## 2019-08-09 DIAGNOSIS — E785 Hyperlipidemia, unspecified: Secondary | ICD-10-CM | POA: Diagnosis not present

## 2019-08-09 DIAGNOSIS — I25708 Atherosclerosis of coronary artery bypass graft(s), unspecified, with other forms of angina pectoris: Secondary | ICD-10-CM

## 2019-08-09 DIAGNOSIS — I6523 Occlusion and stenosis of bilateral carotid arteries: Secondary | ICD-10-CM

## 2019-08-09 DIAGNOSIS — Z8673 Personal history of transient ischemic attack (TIA), and cerebral infarction without residual deficits: Secondary | ICD-10-CM | POA: Diagnosis not present

## 2019-08-09 NOTE — Progress Notes (Signed)
SUBJECTIVE: The patient presents for routine follow-up.  He was hospitalized for urosepsis in July 2020.  He underwent right ureteroscopy and stone removal with right ureteral stent placement by Dr. Alinda Money on 07/15/2019.  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  Echocardiogram performed on 09/30/15 demonstrated normal left ventricular systolic function, EF 0000000, mild LVH, grade 1 diastolic dysfunction, and mild aortic root dilatation.  Carotid Dopplers on2/15/18showed total occlusion of the right internal carotid artery and plaque disease (ungraded) in theleft internal carotid artery.  He underwent CABG in 2000 at [facility]. 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 He was diagnosed with prostate cancer on 06/09/15.  Nuclear myocardial perfusion study 10/02/15 demonstrated prior myocardial infarction in the inferior and inferolateral walls with no evidence of ischemia. It was deemed a low risk study.  The patient denies any symptoms of chest pain, palpitations, shortness of breath, lightheadedness, dizziness, leg swelling, orthopnea, PND, and syncope.  He is here with his wife of over 56 years.    Review of Systems: As per "subjective", otherwise negative.  Allergies  Allergen Reactions  . 5-Alpha Reductase Inhibitors   . Lyrica [Pregabalin]     hallucination  . Motrin [Ibuprofen]     "dr said I can not take it."    Current Outpatient Medications  Medication Sig Dispense Refill  . acetaminophen (TYLENOL) 500 MG tablet Take 1,000 mg by mouth every 6 (six) hours as needed (pain).     Marland Kitchen aspirin EC 81 MG tablet Take 81 mg by mouth daily. Only when off Plavix    . atorvastatin (LIPITOR) 40 MG tablet Take 40 mg by mouth at bedtime.     . calcium carbonate (TUMS - DOSED IN MG ELEMENTAL CALCIUM) 500 MG chewable tablet Chew 1  tablet by mouth daily as needed for indigestion or heartburn.    . clopidogrel (PLAVIX) 75 MG tablet Take 75 mg by mouth daily.    . fluconazole (DIFLUCAN) 150 MG tablet Take 150 mg by mouth once.    Marland Kitchen HYDROcodone-acetaminophen (NORCO/VICODIN) 5-325 MG tablet Take 1-2 tablets by mouth every 6 (six) hours as needed. (Patient not taking: Reported on 07/03/2019) 15 tablet 0  . levETIRAcetam (KEPPRA) 250 MG tablet TAKE 1 TABLET BY MOUTH TWICE DAILY (Patient taking differently: Take 250 mg by mouth 2 (two) times daily. ) 180 tablet 3  . losartan (COZAAR) 25 MG tablet Take 25 mg by mouth daily.    . metFORMIN (GLUCOPHAGE-XR) 500 MG 24 hr tablet Take 1,000 mg by mouth 2 (two) times a day.     . nitroGLYCERIN (NITROSTAT) 0.4 MG SL tablet Place 0.4 mg under the tongue every 5 (five) minutes as needed for chest pain.     Marland Kitchen sulfamethoxazole-trimethoprim (BACTRIM DS) 800-160 MG tablet Take 1 tablet by mouth 2 (two) times daily. Begin on 9/3 (day before your remove your stent) 6 tablet 0  . tiZANidine (ZANAFLEX) 2 MG tablet TAKE 1 TABLET BY MOUTH AT BEDTIME AS NEEDED. (Patient taking differently: Take 2 mg by mouth at bedtime. ) 30 tablet 11  . traMADol (ULTRAM) 50 MG tablet Take 1-2 tablets (50-100 mg total) by mouth every 6 (six) hours as needed (pain). 15 tablet 0   No current facility-administered medications for this visit.     Past Medical History:  Diagnosis Date  . Anxiety    pt denies  .  Coronary artery disease   . Depression    at times  . Diabetes mellitus without complication ([place])    type 2  . History of kidney stones   . Hyperlipidemia   . Hypertension   . Prostate cancer ([place]) 05-2015   radiation CC in [place]  . Stroke [facility])    left side weakness with drop foot   2011    Past Surgical History:  Procedure Laterality Date  . CORONARY ARTERY BYPASS GRAFT     4 graft  . CYSTOSCOPY WITH STENT PLACEMENT Right 06/11/2019   Procedure: CYSTOSCOPY WITH STENT PLACEMENT;  Surgeon: Lucas Mallow, MD;  Location: [facility];  Service: Urology;  Laterality: Right;  . CYSTOSCOPY/URETEROSCOPY/HOLMIUM LASER/STENT PLACEMENT Right 06/06/2019   Procedure: CYSTOSCOPY/URETEROSCOPY/HOLMIUM LASER/STENT PLACEMENT;  Surgeon: Raynelle Bring, MD;  Location: [facility];  Service: Urology;  Laterality: Right;  . CYSTOSCOPY/URETEROSCOPY/HOLMIUM LASER/STENT PLACEMENT Right 07/15/2019   Procedure: CYSTOSCOPY/URETEROSCOPY/STENT PLACEMENT;  Surgeon: Raynelle Bring, MD;  Location: [facility];  Service: Urology;  Laterality: Right;  . PROSTATE BIOPSY    . right hand surgery      Social History   Socioeconomic History  . Marital status: Married    Spouse name: [name]  . Number of children: 5  . Years of education: 8th   . Highest education level: Not on file  Occupational History    Employer: RETIRED  Social Needs  . Financial resource strain: Not on file  . Food insecurity    Worry: Not on file    Inability: Not on file  . Transportation needs    Medical: Not on file    Non-medical: Not on file  Tobacco Use  . Smoking status: Former Smoker    Packs/day: 0.25    Years: 15.00    Pack years: 3.75    Types: Cigarettes    Start date: 10/19/1970    Quit date: 10/19/1985    Years since quitting: 33.8  . Smokeless tobacco: Current User    Types: Chew  Substance and Sexual Activity  . Alcohol use: No    Alcohol/week: 0.0 standard drinks  . Drug use: No  . Sexual activity: Not Currently  Lifestyle  . Physical activity    Days per week: Not on file    Minutes per session: Not on file  . Stress: Not on file  Relationships  . Social Herbalist on phone: Not on file    Gets together: Not on file    Attends religious service: Not on file    Active member of club or organization: Not on file    Attends meetings of clubs or organizations: Not on file    Relationship status: Not on file  . Intimate partner violence    Fear of current or ex partner: Not on file    Emotionally abused: Not on file     Physically abused: Not on file    Forced sexual activity: Not on file  Other Topics Concern  . Not on file  Social History Narrative   Patient lives at home with wife [name].    Patient has 5 children.    Patient has a 8th grade.    Patient is retired.    Patient is on disability.    Patient is right handed.    Patient drinks caffeine every once in a while.      Vitals:   08/09/19 1009  BP: (!) 159/96  Pulse: 67  SpO2: 98%  Weight: 198 lb 3.2 oz (89.9 kg)  Height: 5\' 9"  (1.753 m)    Wt Readings from Last 3 Encounters:  08/09/19 198 lb 3.2 oz (89.9 kg)  07/09/19 200 lb (90.7 kg)  06/11/19 210 lb 5.1 oz (95.4 kg)     PHYSICAL EXAM General: NAD HEENT: Normal. Neck: No JVD, no thyromegaly. Lungs: Clear to auscultation bilaterally with normal respiratory effort. CV: Regular rate and rhythm, normal S1/S2, no S3/S4, no murmur. No pretibial or periankle edema.  No carotid bruit.   Abdomen: Soft, nontender, no distention.  Neurologic: Alert and oriented.  Psych: Normal affect. Skin: Normal. Musculoskeletal: No gross deformities.      Labs: Lab Results  Component Value Date/Time   K 3.6 07/09/2019 11:03 AM   BUN 16 07/09/2019 11:03 AM   CREATININE 0.78 07/09/2019 11:03 AM   ALT 14 06/11/2019 07:38 AM   HGB 14.8 07/09/2019 11:03 AM     Lipids: Lab Results  Component Value Date/Time   LDLCALC (H) 07/09/2010 03:40 AM    127        Total Cholesterol/HDL:CHD Risk Coronary Heart Disease Risk Table                     Men   Women  1/2 Average Risk   3.4   3.3  Average Risk       5.0   4.4  2 X Average Risk   9.6   7.1  3 X Average Risk  23.4   11.0        Use the calculated Patient Ratio above and the CHD Risk Table to determine the patient's CHD Risk.        ATP III CLASSIFICATION (LDL):  <100     mg/dL   Optimal  100-129  mg/dL   Near or Above                    Optimal  130-159  mg/dL   Borderline  160-189  mg/dL   High  >190     mg/dL   Very High    CHOL  07/09/2010 03:40 AM    193        ATP III CLASSIFICATION:  <200     mg/dL   Desirable  200-239  mg/dL   Borderline High  >=240    mg/dL   High          TRIG 174 (H) 07/09/2010 03:40 AM   HDL 31 (L) 07/09/2010 03:40 AM       ASSESSMENT AND PLAN: 1. CAD s/p CABG:Symptomatically stable. Stress test and echocardiogram results reviewed above. Continue Lipitor, Plavix, and losartan.  He takes Plavix for history of CVA.  2. Essential HTN:  Elevated.  He said it is usually normal.  No changes to therapy.  3. Hyperlipidemia:Continue Lipitor 40 mg. I will obtain a copy of most recent lipids.  4. CVA: Continue Plavix.  5. Bilateral carotid artery stenosis: Dopplers fromMarch 2020 reviewed which demonstrated previously known total occlusion of the right ICA and 1 to 39% stenosis of the left ICA. Continue Plavix and Lipitor. Follows with vascular surgery.   Disposition: Follow up 6 months   Kate Sable, M.D., F.A.C.C.

## 2019-08-09 NOTE — Patient Instructions (Signed)
Your physician wants you to follow-up in: [place] will receive a reminder letter in the mail two months in advance. If you don't receive a letter, please call our office to schedule the follow-up appointment.  Your physician recommends that you continue on your current medications as directed. Please refer to the Current Medication list given to you today.  Thank you for choosing [facility]!!

## 2019-08-21 ENCOUNTER — Encounter: Payer: Self-pay | Admitting: *Deleted

## 2019-09-04 ENCOUNTER — Other Ambulatory Visit: Payer: Self-pay

## 2019-09-04 ENCOUNTER — Encounter (HOSPITAL_COMMUNITY): Payer: Self-pay | Admitting: *Deleted

## 2019-09-04 ENCOUNTER — Emergency Department (HOSPITAL_COMMUNITY)
Admission: EM | Admit: 2019-09-04 | Discharge: 2019-09-04 | Disposition: A | Discharge status: Home / Self Care | Payer: Medicare Other | Attending: Emergency Medicine | Admitting: Emergency Medicine

## 2019-09-04 DIAGNOSIS — Z5321 Procedure and treatment not carried out due to patient leaving prior to being seen by health care provider: Secondary | ICD-10-CM | POA: Diagnosis not present

## 2019-09-04 DIAGNOSIS — R030 Elevated blood-pressure reading, without diagnosis of hypertension: Secondary | ICD-10-CM | POA: Diagnosis not present

## 2019-09-04 HISTORY — DX: Calculus of kidney: N20.0

## 2019-09-04 LAB — URINALYSIS, ROUTINE W REFLEX MICROSCOPIC
Bacteria, UA: NONE SEEN
Bilirubin Urine: NEGATIVE
Glucose, UA: NEGATIVE mg/dL
Hgb urine dipstick: NEGATIVE
Ketones, ur: NEGATIVE mg/dL
Nitrite: NEGATIVE
Protein, ur: 30 mg/dL — AB
Specific Gravity, Urine: 1.03 (ref 1.005–1.030)
pH: 5 (ref 5.0–8.0)

## 2019-09-04 NOTE — ED Triage Notes (Signed)
Registration left triage RN a note saying that pt left because he couldn't wait any longer.

## 2019-09-04 NOTE — ED Triage Notes (Addendum)
Pt c/o high blood pressure x 3 days, decreased appetite, losing weight x 1 week. Pt recently had ureteral stents placed after being septic with kidney stones and since then has been having these symptoms. Denies dysuria but is concerned infection may be coming back.

## 2019-11-05 ENCOUNTER — Other Ambulatory Visit: Payer: Self-pay | Admitting: Neurology

## 2020-01-09 ENCOUNTER — Ambulatory Visit: Payer: Medicare Other | Admitting: Adult Health

## 2020-01-10 ENCOUNTER — Ambulatory Visit: Payer: Medicare Other | Admitting: Adult Health

## 2020-01-14 ENCOUNTER — Other Ambulatory Visit: Payer: Self-pay | Admitting: Urology

## 2020-01-20 ENCOUNTER — Other Ambulatory Visit: Payer: Self-pay

## 2020-01-20 ENCOUNTER — Encounter: Payer: Self-pay | Admitting: Adult Health

## 2020-01-20 ENCOUNTER — Ambulatory Visit: Payer: Medicare PPO | Admitting: Adult Health

## 2020-01-20 VITALS — BP 142/94 | HR 88 | Temp 97.3°F | Ht 69.0 in | Wt 199.0 lb

## 2020-01-20 DIAGNOSIS — I6522 Occlusion and stenosis of left carotid artery: Secondary | ICD-10-CM

## 2020-01-20 DIAGNOSIS — I10 Essential (primary) hypertension: Secondary | ICD-10-CM | POA: Diagnosis not present

## 2020-01-20 DIAGNOSIS — I6521 Occlusion and stenosis of right carotid artery: Secondary | ICD-10-CM | POA: Diagnosis not present

## 2020-01-20 DIAGNOSIS — G8114 Spastic hemiplegia affecting left nondominant side: Secondary | ICD-10-CM | POA: Diagnosis not present

## 2020-01-20 DIAGNOSIS — Z8673 Personal history of transient ischemic attack (TIA), and cerebral infarction without residual deficits: Secondary | ICD-10-CM

## 2020-01-20 DIAGNOSIS — E785 Hyperlipidemia, unspecified: Secondary | ICD-10-CM

## 2020-01-20 MED ORDER — LEVETIRACETAM 250 MG PO TABS: 250.0000 mg | ORAL_TABLET | Freq: Two times a day (BID) | ORAL | 3 refills | Status: DC

## 2020-01-20 NOTE — Patient Instructions (Signed)
Continue Keppra 250 mg twice daily for seizure prevention  Continue clopidogrel 75 mg daily  and atorvastatin for secondary stroke prevention  Continue to follow up with PCP regarding cholesterol, blood pressure and diabetes management   We will repeat carotid ultrasound for surveillance monitoring of carotid stenosis -you will be called to schedule appointment  Consider further evaluation of possible shoulder subluxation -please call office if interested in referral  Continue to monitor blood pressure at home  Maintain strict control of hypertension with blood pressure goal below 130/90, diabetes with hemoglobin A1c goal below 6.5% and cholesterol with LDL cholesterol (bad cholesterol) goal below 70 mg/dL. I also advised the patient to eat a healthy diet with plenty of whole grains, cereals, fruits and vegetables, exercise regularly and maintain ideal body weight.  Followup in the future with me in 1 year or call earlier if needed       Thank you for coming to see [place] at [facility]. I hope we have been able to provide you high quality care today.  You may receive a patient satisfaction survey over the next few weeks. We would appreciate your feedback and comments so that we may continue to improve ourselves and the health of our patients.

## 2020-01-20 NOTE — Progress Notes (Signed)
I agree with the above plan 

## 2020-01-20 NOTE — Progress Notes (Signed)
[facility]  PATIENT: Arthur Cisneros. DOB: 1956-01-14   REASON FOR VISIT: Follow-up for history of stroke and post stroke seizure HISTORY FROM: Patient  Chief complaint: Chief Complaint  Patient presents with  . Follow-up    Rm9. with wife. Yrly f/u. No questions nor concerns.       HISTORY OF PRESENT ILLNESS:  Arthur Cisneros is a 64 year old male who is being seen today for 1 year follow-up with history of stroke 06/2010 and seizures 01/2011 accompanied by his wife.  He has been stable from a neurological standpoint over the past year without recurrent strokes or seizure activity.  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  He does report left shoulder pain with questionable subluxation.  Previously seen by [name] but has not followed up since 2015.  Remains on Keppra 250 mg twice daily without side effects.  Continues on Plavix and atorvastatin for secondary stroke prevention without side effects.  Blood pressure today today 142/94.  Glucose levels stable.  Repeat carotid Doppler 01/2019 stable with persistent known chronic right ICA occlusion and 1 to 39% left ICA stenosis.  He does report ongoing kidney concerns with being hospitalized for sepsis 05/2019 along with multiple renal stone causing obstructive uropathy with new stent placement.  Plans on undergoing additional kidney procedure on 01/30/2020.  Continues to follow with urology regularly.  No further concerns at this time.       History copied for reference purposes only History of right MCA infarct occurred August 2011. He had isolated partial seizure with secondary generalization in March 2012 likely as late effects from stroke. He has vascular risk factors of hypertension, hyperlipidemia, coronary artery disease and carotid occlusion.    UPDATE 12/17/2015 CM  Arthur Cisneros, 64year-old male returns for followup. He has a history of right MCA infarct in 2011 and also isolated seizure with  secondary generalization in March of 2012 likely as late effect of his stroke. He remains on Keppra without further seizure activity, and he is on Plavix with minimal bruising without further stroke or TIA symptoms. He continues to wear AFO brace on the left lower leg. No falls since last seen. Doppler study May 2016 without change from the previous year. He denies any double vision, increased focal weakness, speech or swallowing difficulty, blackouts or confusion. He continues to walk around the house for exercise.he uses a single-point cane . His right parietal occipital transient headaches are improved and he no longer takes  Topamax. He returns for reevaluation. Recent diagnosis of prostate cancer and he just started treatments this past Monday for 8 weeks.   Update 12/15/2016 PS : He returns for follow-up after last visit a year ago. He states his continues to do well. His had no recurrent stroke or TIA symptoms since last 6 years. Is also not had any seizures last 5 years. He remains on Keppra 250 twice daily which is tolerating well. He continues to have spastic (but is able to walk with a cane. Does not have much use of his left arm. He was diagnosed with prostate cancer in March 2017 and has undergone radiation treatment and  on 3 monthly injections. He states his starting Plavix well without bleeding or bruising. His blood pressure is well controlled and today it is 1-2/77. He is tolerating Lipitor well without myalgias and had last lipid profile checked in October 2017 which is satisfactory. He still gets occasional leg cramps and takes Zanaflex which seems to help  him. He has not had carotid ultrasound study done in several years. He states he is trying to eat healthy and lose weight UPDATE 12/18/2017 CM Arthur Cisneros, 64 year old male returns for follow-up with history of stroke event 6-1/2 years ago.  He remains on Plavix further stroke or TIA symptoms.  He has minimal bruising and no bleeding.  He also has  history of seizure disorder but no seizures in 6 years.  He remains on Keppra 250 mg twice daily.  Blood pressure in the office today 117/79.  He remains on Lipitor without myalgias.  He denies any falls.  He has AFO left lower leg.  He continues to take Zanaflex for cramping.  Carotid ultrasound after his last visit shows persistent chronic right ICA occlusion.  Presence of plaque on the left side but with improvement compared with previous study in May 2016.  Will repeat next year.  He was diagnosed with prostate cancer and had radiation.  He has frequent accidents and wears depends.  He returns for reevaluation Update 01/08/2019 : He returns for follow-up after last visit a year ago.  He states he is doing well.  He has had no recurrent strokes now since August 2011 as well as no recurrent seizures since March 2012.  He remains on Plavix which is tolerating well without bruising or bleeding.  He states his blood pressure is under good control and today it is 118/82.  He remains on Lipitor which is tolerating well without muscle aches and pains since states lipid profile checked at last primary care visit was satisfactory.  His sugars are also under good control though he cannot tell me the exact numbers.  He continues to use a cane to walk and states he is careful and has not had any major falls or injuries.  He has had no new interval health problems either.  Last carotid ultrasound was done on 01/12/2017 which had shown chronic right ICA occlusion and no significant left-sided stenosis.      REVIEW OF SYSTEMS: Full 14 system review of systems performed and notable only for those listed, all others are neg:  Weakness, speech difficulty, pain and all other systems negative  ALLERGIES: Allergies  Allergen Reactions  . 5-Alpha Reductase Inhibitors   . Lyrica [Pregabalin]     hallucination  . Motrin [Ibuprofen]     "dr said I can not take it."  . Tramadol     Hallucinations, "legs jumping", increased  appetite    HOME MEDICATIONS: Outpatient Medications Prior to Visit  Medication Sig Dispense Refill  . acetaminophen (TYLENOL) 500 MG tablet Take 1,000 mg by mouth every 6 (six) hours as needed (pain).     Marland Kitchen atorvastatin (LIPITOR) 40 MG tablet Take 40 mg by mouth at bedtime.     . calcium carbonate (TUMS - DOSED IN MG ELEMENTAL CALCIUM) 500 MG chewable tablet Chew 1 tablet by mouth daily as needed for indigestion or heartburn.    . clopidogrel (PLAVIX) 75 MG tablet Take 75 mg by mouth daily.    Marland Kitchen levETIRAcetam (KEPPRA) 250 MG tablet TAKE 1 TABLET BY MOUTH TWICE DAILY 180 tablet 0  . losartan (COZAAR) 25 MG tablet Take 25 mg by mouth daily.    . metFORMIN (GLUCOPHAGE-XR) 500 MG 24 hr tablet Take 1,000 mg by mouth 2 (two) times a day.     . nitroGLYCERIN (NITROSTAT) 0.4 MG SL tablet Place 0.4 mg under the tongue every 5 (five) minutes as needed for chest pain.     Marland Kitchen  tiZANidine (ZANAFLEX) 2 MG tablet TAKE 1 TABLET BY MOUTH AT BEDTIME AS NEEDED. (Patient taking differently: Take 2 mg by mouth at bedtime. ) 30 tablet 11   No facility-administered medications prior to visit.    PAST MEDICAL HISTORY: Past Medical History:  Diagnosis Date  . Anxiety    pt denies  . Coronary artery disease   . Depression    at times  . Diabetes mellitus without complication ([place])    type 2  . History of kidney stones   . Hyperlipidemia   . Hypertension   . Kidney stones   . Prostate cancer ([place]) 05-2015   radiation [place] in [place]  . Stroke [facility])    left side weakness with drop foot   2011    PAST SURGICAL HISTORY: Past Surgical History:  Procedure Laterality Date  . CORONARY ARTERY BYPASS GRAFT     4 graft  . CYSTOSCOPY WITH STENT PLACEMENT Right 06/11/2019   Procedure: CYSTOSCOPY WITH STENT PLACEMENT;  Surgeon: Lucas Mallow, MD;  Location: [facility];  Service: Urology;  Laterality: Right;  . CYSTOSCOPY/URETEROSCOPY/HOLMIUM LASER/STENT PLACEMENT Right 06/06/2019   Procedure:  CYSTOSCOPY/URETEROSCOPY/HOLMIUM LASER/STENT PLACEMENT;  Surgeon: Raynelle Bring, MD;  Location: [facility];  Service: Urology;  Laterality: Right;  . CYSTOSCOPY/URETEROSCOPY/HOLMIUM LASER/STENT PLACEMENT Right 07/15/2019   Procedure: CYSTOSCOPY/URETEROSCOPY/STENT PLACEMENT;  Surgeon: Raynelle Bring, MD;  Location: [facility];  Service: Urology;  Laterality: Right;  . PROSTATE BIOPSY    . right hand surgery      FAMILY HISTORY: Family History  Problem Relation Age of Onset  . Heart disease Mother   . Diabetes Mother   . Arthritis Mother   . Heart disease Father   . Diabetes Father   . Stroke Father   . Stroke Other   . Gout Other   . Coronary artery disease Other     SOCIAL HISTORY: Social History   Socioeconomic History  . Marital status: Married    Spouse name: [name]  . Number of children: 5  . Years of education: 8th   . Highest education level: Not on file  Occupational History    Employer: RETIRED  Tobacco Use  . Smoking status: Former Smoker    Packs/day: 0.25    Years: 15.00    Pack years: 3.75    Types: Cigarettes    Start date: 10/19/1970    Quit date: 10/19/1985    Years since quitting: 34.2  . Smokeless tobacco: Current User    Types: Chew  Substance and Sexual Activity  . Alcohol use: Not Currently    Alcohol/week: 0.0 standard drinks  . Drug use: No  . Sexual activity: Not Currently  Other Topics Concern  . Not on file  Social History Narrative   Patient lives at home with wife [name].    Patient has 5 children.    Patient has a 8th grade.    Patient is retired.    Patient is on disability.    Patient is right handed.    Patient drinks caffeine every once in a while.    Social Determinants of Health   Financial Resource Strain:   . Difficulty of Paying Living Expenses: Not on file  Food Insecurity:   . Worried About Charity fundraiser in the Last Year: Not on file  . Ran Out of Food in the Last Year: Not on file  Transportation Needs:   . Lack of  Transportation (Medical): Not on file  . Lack of Transportation (  Non-Medical): Not on file  Physical Activity:   . Days of Exercise per Week: Not on file  . Minutes of Exercise per Session: Not on file  Stress:   . Feeling of Stress : Not on file  Social Connections:   . Frequency of Communication with Friends and Family: Not on file  . Frequency of Social Gatherings with Friends and Family: Not on file  . Attends Religious Services: Not on file  . Active Member of Clubs or Organizations: Not on file  . Attends Archivist Meetings: Not on file  . Marital Status: Not on file  Intimate Partner Violence:   . Fear of Current or Ex-Partner: Not on file  . Emotionally Abused: Not on file  . Physically Abused: Not on file  . Sexually Abused: Not on file     PHYSICAL EXAM  Vitals:   01/20/20 1009  BP: (!) 142/94  Pulse: 88  Temp: (!) 97.3 F (36.3 C)  SpO2: 99%  Weight: 199 lb (90.3 kg)  Height: 5\' 9"  (1.753 m)   Body mass index is 29.39 kg/m.  General: well developed, well nourished, pleasant middle aged 65 male, seated, in no evident distress Head: head normocephalic and atraumatic.   Neck: supple with no carotid or supraclavicular bruits Cardiovascular: regular rate and rhythm, no murmurs Musculoskeletal: no deformity Skin:  no rash/petichiae Vascular:  Normal pulses all extremities   Neurologic Exam Mental Status: Awake and fully alert. Oriented to place and time. Recent and remote memory intact. Attention span, concentration and fund of knowledge appropriate. Mood and affect appropriate.  Cranial Nerves: Fundoscopic exam reveals sharp disc margins. Pupils equal, briskly reactive to light. Extraocular movements full without nystagmus. Visual fields full to confrontation. Hearing intact. Facial sensation intact. Left lower facial weakness.  Motor:  LUE: 2/5 deltoid and bicep. 0/5 tricep; no finger movement with increased spasticity.  Limited shoulder ROM  with possible subluxation LLE: 4/5 AFO brace Full strength right upper and lower extremity Sensory.: intact to touch , pinprick , position and vibratory sensation.  Coordination: Rapid alternating movements normal in all extremities except limited on left side. Finger-to-nose and heel-to-shin performed accurately on right side Gait and Station: Arises from chair without difficulty. Stance is normal. Gait demonstrates  hemiplegic gait with use of cane Reflexes: 1+ and symmetric. Toes downgoing.      DIAGNOSTIC DATA (LABS, IMAGING, TESTING) - I reviewed patient records, labs, notes, testing and imaging myself where available.  Lab Results  Component Value Date   WBC 8.0 07/09/2019   HGB 14.8 07/09/2019   HCT 44.3 07/09/2019   MCV 92.9 07/09/2019   PLT 208 07/09/2019      Component Value Date/Time   NA 139 07/09/2019 1103   K 3.6 07/09/2019 1103   CL 108 07/09/2019 1103   CO2 21 (L) 07/09/2019 1103   GLUCOSE 135 (H) 07/09/2019 1103   BUN 16 07/09/2019 1103   CREATININE 0.78 07/09/2019 1103   CALCIUM 9.3 07/09/2019 1103   PROT 6.1 (L) 06/11/2019 0738   ALBUMIN 3.2 (L) 06/11/2019 0738   AST 21 06/11/2019 0738   ALT 14 06/11/2019 0738   ALKPHOS 30 (L) 06/11/2019 0738   BILITOT 1.0 06/11/2019 0738   GFRNONAA >60 07/09/2019 1103   GFRAA >60 07/09/2019 1103      ASSESSMENT AND PLAN 64 y.o. year old male has a past medical history of Hyperlipidemia; Hypertension; Stroke; Diabetes mellitus; Coronary artery disease; Anxiety; and Prostate cancer (05-2015). And seizure disorder  in excellent control on Keppra.  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 vs evaluation to orthopedics for possible pain improvement of likely left  shoulder subluxation post stroke (orthopedic evaluation due to patient request of being seen closer to [place], [place]).  He would like to hold off at this time but will call office in the future if interested in pursuing -Repeat carotid ultrasound for yearly surveillance monitoring of carotid stenosis -Maintain strict control of hypertension with blood pressure goal below 130/90, lipids with LDL cholesterol goal below 70 mg percent and diabetes with hemoglobin A1c goal below 6.5%.  He was encouraged to eat a healthy diet and to be active and to use his cane at all times and avoid falls and injuries.   Check screening carotid ultrasound and transcranial Doppler studies.   Continue Keppra and the current dose of 250 mg twice daily for seizure prevention and he was given a refill for a year.    Follow-up in 1 year or call earlier if needed  I spent 30 minutes of face-to-face and non-face-to-face time with patient.  This included previsit chart review, lab review, study review, order entry, electronic health record documentation, patient education   Frann Rider, [facility]  [facility] [address] [place], [place] [address]  Phone [phone] Fax [phone] Note: This document was prepared with digital dictation and possible smart phrase technology. Any transcriptional errors that result from this process are unintentional.

## 2020-01-22 NOTE — Patient Instructions (Addendum)
DUE TO COVID-19 ONLY ONE VISITOR IS ALLOWED TO COME WITH YOU AND STAY IN THE WAITING ROOM ONLY DURING PRE OP AND PROCEDURE DAY OF SURGERY. THE 1 VISITOR MAY VISIT WITH YOU AFTER SURGERY IN YOUR PRIVATE ROOM DURING VISITING HOURS ONLY!  YOU NEED TO HAVE A COVID 19 TEST ON__3/15/21_____ @_2 :05 pm______, at [facility].  THIS TEST MUST BE DONE BEFORE SURGERY, COME  ONCE YOUR COVID TEST IS COMPLETED, PLEASE BEGIN THE QUARANTINE INSTRUCTIONS AS OUTLINED IN YOUR HANDOUT.                Eldred Manges.    Your procedure is scheduled on: 01/30/20   Report to [facility] Main  Entrance   Report to admitting at 12:30 PM     Call this number if you have problems the morning of surgery [phone]    Remember: Do not eat food after Midnight.              You may have clear liquids until 8:30 am    CLEAR LIQUID DIET   Foods Allowed                                                                     Foods Excluded  Coffee and tea, regular and decaf                             liquids that you cannot  Plain Jell-O any favor except red or purple                                           see through such as: Fruit ices (not with fruit pulp)                                     milk, soups, orange juice  Iced Popsicles                                    All solid food Carbonated beverages, regular and diet                                    Cranberry, grape and apple juices Sports drinks like Gatorade Lightly seasoned clear broth or consume(fat free) Sugar, honey syrup    BRUSH YOUR TEETH MORNING OF SURGERY AND RINSE YOUR MOUTH OUT, NO CHEWING GUM CANDY OR MINTS.   How to Manage Your Diabetes Before and After Surgery  Why is it important to control my blood sugar before and after surgery? . Improving blood sugar levels before and after surgery helps healing and can limit problems. . A way of improving blood sugar control is eating a healthy diet by: o  Eating less sugar and  carbohydrates o  Increasing activity/exercise o  Talking with your doctor about reaching your blood sugar goals . High blood sugars (  greater than 180 mg/dL) can raise your risk of infections and slow your recovery, so you will need to focus on controlling your diabetes during the weeks before surgery. . Make sure that the doctor who takes care of your diabetes knows about your planned surgery including the date and location.  How do I manage my blood sugar before surgery? . Check your blood sugar at least 4 times a day, starting 2 days before surgery, to make sure that the level is not too high or low. o Check your blood sugar the morning of your surgery when you wake up and every 2 hours until you get to the Short Stay unit. . If your blood sugar is less than 70 mg/dL, you will need to treat for low blood sugar: o Do not take insulin. o Treat a low blood sugar (less than 70 mg/dL) with  cup of clear juice (cranberry or apple), 4 glucose tablets, OR glucose gel. o Recheck blood sugar in 15 minutes after treatment (to make sure it is greater than 70 mg/dL). If your blood sugar is not greater than 70 mg/dL on recheck, call [phone] for further instructions. . Report your blood sugar to the short stay nurse when you get to Short Stay.  . If you are admitted to the hospital after surgery: o Your blood sugar will be checked by the staff and you will probably be given insulin after surgery (instead of oral diabetes medicines) to make sure you have good blood sugar levels. o The goal for blood sugar control after surgery is 80-180 mg/dL.   WHAT DO I DO ABOUT MY DIABETES MEDICATION?  Marland Kitchen Do not take oral diabetes medicines (pills) the morning of surgery    Take these medicines the morning of surgery with A SIP OF WATER: Levetiracetam                                 You may not have any metal on your body including              piercings  Do not wear jewelry,  lotions, powders or  deodorant                        Men may shave face and neck.   Do not bring valuables to the hospital. [place].  Contacts, dentures or bridgework may not be worn into surgery.       Patients discharged the day of surgery will not be allowed to drive home  . IF YOU ARE HAVING SURGERY AND GOING HOME THE SAME DAY, YOU MUST HAVE AN ADULT TO DRIVE YOU HOME AND BE WITH YOU FOR 24 HOURS.   YOU MAY GO HOME BY TAXI OR UBER OR ORTHERWISE, BUT AN ADULT MUST ACCOMPANY YOU HOME AND STAY WITH YOU FOR 24 HOURS.  Name and phone number of your driver:  Special Instructions: N/A              Please read over the following fact sheets you were given: _____________________________________________________________________             [facility] - Preparing for Surgery  Before surgery, you can play an important role.   Because skin is not sterile, your skin needs to be as free of germs  as possible.   You can reduce the number of germs on your skin by washing with CHG (chlorahexidine gluconate) soap before surgery.   CHG is an antiseptic cleaner which kills germs and bonds with the skin to continue killing germs even after washing. Please DO NOT use if you have an allergy to CHG or antibacterial soaps.   If your skin becomes reddened/irritated stop using the CHG and inform your nurse when you arrive at Short Stay. ou may shave your face/neck.  Please follow these instructions carefully:  1.  Shower with CHG Soap the night before surgery and the  morning of Surgery.  2.  If you choose to wash your hair, wash your hair first as usual with your  normal  shampoo.  3.  After you shampoo, rinse your hair and body thoroughly to remove the  shampoo.                                        4.  Use CHG as you would any other liquid soap.  You can apply chg directly  to the skin and wash                       Gently with a scrungie or clean washcloth.  5.  Apply the CHG  Soap to your body ONLY FROM THE NECK DOWN.   Do not use on face/ open                           Wound or open sores. Avoid contact with eyes, ears mouth and genitals (private parts).                       Wash face,  Genitals (private parts) with your normal soap.             6.  Wash thoroughly, paying special attention to the area where your surgery  will be performed.  7.  Thoroughly rinse your body with warm water from the neck down.  8.  DO NOT shower/wash with your normal soap after using and rinsing off  the CHG Soap.             9.  Pat yourself dry with a clean towel.            10.  Wear clean pajamas.            11.  Place clean sheets on your bed the night of your first shower and do not  sleep with pets. Day of Surgery : Do not apply any lotions/deodorants the morning of surgery.  Please wear clean clothes to the hospital/surgery center.  FAILURE TO FOLLOW THESE INSTRUCTIONS MAY RESULT IN THE CANCELLATION OF YOUR SURGERY PATIENT SIGNATURE_________________________________  NURSE SIGNATURE__________________________________  ________________________________________________________________________

## 2020-01-23 ENCOUNTER — Encounter (HOSPITAL_COMMUNITY)
Admission: RE | Admit: 2020-01-23 | Discharge: 2020-01-23 | Disposition: A | Discharge status: Home / Self Care | Payer: Medicare PPO | Source: Ambulatory Visit | Attending: Urology | Admitting: Urology

## 2020-01-23 ENCOUNTER — Other Ambulatory Visit: Payer: Self-pay

## 2020-01-23 ENCOUNTER — Encounter (HOSPITAL_COMMUNITY): Payer: Self-pay

## 2020-01-23 DIAGNOSIS — E119 Type 2 diabetes mellitus without complications: Secondary | ICD-10-CM | POA: Diagnosis not present

## 2020-01-23 DIAGNOSIS — Z01812 Encounter for preprocedural laboratory examination: Secondary | ICD-10-CM | POA: Insufficient documentation

## 2020-01-23 LAB — BASIC METABOLIC PANEL
Anion gap: 10 (ref 5–15)
BUN: 15 mg/dL (ref 8–23)
CO2: 24 mmol/L (ref 22–32)
Calcium: 8.8 mg/dL — ABNORMAL LOW (ref 8.9–10.3)
Chloride: 108 mmol/L (ref 98–111)
Creatinine, Ser: 1.14 mg/dL (ref 0.61–1.24)
GFR calc Af Amer: >60 mL/min (ref >60)
GFR calc non Af Amer: >60 mL/min (ref >60)
Glucose, Bld: 92 mg/dL (ref 70–99)
Potassium: 3.8 mmol/L (ref 3.5–5.1)
Sodium: 142 mmol/L (ref 135–145)

## 2020-01-23 LAB — CBC
HCT: 45.5 % (ref 39.0–52.0)
Hemoglobin: 14.8 g/dL (ref 13.0–17.0)
MCH: 31.1 pg (ref 26.0–34.0)
MCHC: 32.5 g/dL (ref 30.0–36.0)
MCV: 95.6 fL (ref 80.0–100.0)
Platelets: 237 10*3/uL (ref 150–400)
RBC: 4.76 MIL/uL (ref 4.22–5.81)
RDW: 12.7 % (ref 11.5–15.5)
WBC: 7.8 10*3/uL (ref 4.0–10.5)
nRBC: 0 % (ref 0.0–0.2)

## 2020-01-23 LAB — HEMOGLOBIN A1C
Hgb A1c MFr Bld: 5.5 % (ref 4.8–5.6)
Mean Plasma Glucose: 111.15 mg/dL

## 2020-01-23 NOTE — Progress Notes (Signed)
PCP - Dr. Jacqualine Mau Cardiologist - Dr. Bronson Ing  Chest x-ray - 06/11/19 EKG - 06/09/20 Stress Test - no ECHO - 2016 Cardiac Cath - CABGx4 2000  Sleep Study -no  CPAP -   Fasting Blood Sugar - 126-148 Checks Blood Sugar _____ times a day. Twice a week  Blood Thinner Instructions:Plavix . Dr. Alinda Money said to stop it 01/27/20 Aspirin Instructions: Last Dose:01/27/20  Anesthesia review:   Patient denies shortness of breath, fever, cough and chest pain at PAT appointment yes  Patient verbalized understanding of instructions that were given to them at the PAT appointment. Patient was also instructed that they will need to review over the PAT instructions again at home before surgery. yes

## 2020-01-27 ENCOUNTER — Other Ambulatory Visit: Payer: Self-pay

## 2020-01-27 ENCOUNTER — Other Ambulatory Visit (HOSPITAL_COMMUNITY): Payer: Medicare PPO

## 2020-01-27 ENCOUNTER — Other Ambulatory Visit (HOSPITAL_COMMUNITY)
Admission: RE | Admit: 2020-01-27 | Discharge: 2020-01-27 | Disposition: A | Discharge status: Home / Self Care | Payer: Medicare PPO | Source: Ambulatory Visit | Attending: Urology | Admitting: Urology

## 2020-01-27 DIAGNOSIS — Z01812 Encounter for preprocedural laboratory examination: Secondary | ICD-10-CM | POA: Insufficient documentation

## 2020-01-27 DIAGNOSIS — Z20822 Contact with and (suspected) exposure to covid-19: Secondary | ICD-10-CM | POA: Insufficient documentation

## 2020-01-27 LAB — SARS CORONAVIRUS 2 (TAT 6-24 HRS): SARS Coronavirus 2: NEGATIVE

## 2020-01-27 NOTE — Progress Notes (Signed)
Anesthesia Chart Review   Case: [id number] Date/Time: 01/30/20 1415   Procedure: CYSTOSCOPY/RETROGRADE/URETEROSCOPY/HOLMIUM LASER/STENT PLACEMENT (Bilateral )   Anesthesia type: General   Pre-op diagnosis: RIGHT URETERAL CALCULI, LEFT FLANK PAIN   Location: [facility]OM 03 / [facility]   Surgeons: Raynelle Bring, MD      DISCUSSION:63 y.o. former smoker (3.75 pack years, quit 10/19/85) with h/o HLD, DM II, CAD (CABG), carotid artery stensosis, HTN, Stroke 2011 (residual deficits of left spastic hemiparesis and possible left shoulder subluxation), seizures with good control on Keppra, prostate cancer, right ureteral calculi scheduled for above procedure 01/30/20 with Dr. Raynelle Bring.   Last seen by neurologist 01/20/2020. Stable at this visit with 1 year follow up recommended.   Last seen by cardiology 08/09/2019.  Stable at this visit, 6 month follow up recommended. Nuclear myocardial perfusion study 10/02/15 demonstrated prior myocardial infarction in the inferior and inferolateral walls with no evidence of ischemia. It was deemed a low risk study.  Last dose of Plavix 01/27/2020.   Anticipate pt can proceed with planned procedure barring acute status change.   VS: BP (!) 153/99   Pulse 72   Temp 37 C (Oral)   Resp 18   Ht 5\' 9"  (1.753 m)   Wt 90.7 kg   SpO2 96%   BMI 29.53 kg/m   PROVIDERS: System, Pcp Not In  Arthur Sable, MD is Cardiologist  LABS: Labs reviewed: Acceptable for surgery. (all labs ordered are listed, but only abnormal results are displayed)  Labs Reviewed - No data to display   IMAGES:   EKG: 06/03/2019 Rate 65 bpm Normal sinus rhythm   CV: Echo 09/30/2015 Study Conclusions   - Left ventricle: The cavity size was normal. Wall thickness was  increased in a pattern of mild LVH. Systolic function was normal.  The estimated ejection fraction was in the range of 55% to 60%.  Images were inadequate for LV wall motion assessment. Doppler  parameters  are consistent with abnormal left ventricular  relaxation (grade 1 diastolic dysfunction).  - Aortic valve: Mildly calcified annulus. Trileaflet.  - Aorta: Mild aortic root dilatation. Aortic root dimension: 41 mm  (ED).  - Mitral valve: Calcified annulus. Normal thickness leaflets .  - Right ventricle: Systolic function was mildly reduced.  Past Medical History:  Diagnosis Date  . Anxiety    pt denies  . Coronary artery disease   . Depression    at times  . Diabetes mellitus without complication ([place])    type 2  . History of kidney stones   . Hyperlipidemia   . Hypertension   . Kidney stones   . Prostate cancer ([place]) 05-2015   radiation CC in [place]  . Stroke [facility])    left side weakness with drop foot   2011    Past Surgical History:  Procedure Laterality Date  . CORONARY ARTERY BYPASS GRAFT     4 graft  . CYSTOSCOPY WITH STENT PLACEMENT Right 06/11/2019   Procedure: CYSTOSCOPY WITH STENT PLACEMENT;  Surgeon: Lucas Mallow, MD;  Location: [facility];  Service: Urology;  Laterality: Right;  . CYSTOSCOPY/URETEROSCOPY/HOLMIUM LASER/STENT PLACEMENT Right 06/06/2019   Procedure: CYSTOSCOPY/URETEROSCOPY/HOLMIUM LASER/STENT PLACEMENT;  Surgeon: Raynelle Bring, MD;  Location: [facility];  Service: Urology;  Laterality: Right;  . CYSTOSCOPY/URETEROSCOPY/HOLMIUM LASER/STENT PLACEMENT Right 07/15/2019   Procedure: CYSTOSCOPY/URETEROSCOPY/STENT PLACEMENT;  Surgeon: Raynelle Bring, MD;  Location: [facility];  Service: Urology;  Laterality: Right;  . PROSTATE BIOPSY    . right hand surgery  MEDICATIONS: . acetaminophen (TYLENOL) 500 MG tablet  . atorvastatin (LIPITOR) 40 MG tablet  . clopidogrel (PLAVIX) 75 MG tablet  . levETIRAcetam (KEPPRA) 250 MG tablet  . losartan (COZAAR) 25 MG tablet  . metFORMIN (GLUCOPHAGE-XR) 500 MG 24 hr tablet  . nitroGLYCERIN (NITROSTAT) 0.4 MG SL tablet  . tiZANidine (ZANAFLEX) 2 MG tablet   No current facility-administered medications for this encounter.     Maia Plan [facility] Pre-Surgical Testing [phone] 01/27/20  2:49 PM

## 2020-01-29 NOTE — H&P (Signed)
Office Visit Report     01/03/2020   --------------------------------------------------------------------------------   Arthur Cisneros  MRN: [id number]  DOB: 10/06/1956, 64 year old Male  SSN: [id number]   PRIMARY CARE:  Petra Kuba, MD  REFERRING:  Raynelle Bring, Eduardo Osier  PROVIDER:  Raynelle Bring, M.D.  TREATING:  Azucena Fallen, NP  LOCATION:  [facility] [phone]     --------------------------------------------------------------------------------   CC/HPI: 1. Locally advanced prostate cancer  2. Right renal and ureteral calculi   11/20/19: Mr. Savoca follows up today for further evaluation of his ongoing right hydronephrosis as well as for routine follow-up of his locally advanced prostate cancer. He has not had any recent flank pain or hematuria. He has not passed any stones recently. He most recently had undergone right ureteroscopy in August removal of remaining fragments and was visually cleared. However, he did have some residual small fragments in his kidney at that time. Upon follow-up, he has had persistent right-sided hydronephrosis. This was last noted on ultrasound evaluation in November.   01/03/20: Patient with above-noted history. He returns today for ongoing follow-up. He denies seeing any stones pass in the interim. He has noted some intermittent right flank pain on occasion. He does complain of some left back pain today, however. This is intermittent for the past 2-3 days. Pain seems to be worse with movement. Recent CT imaging did reveal small possible left renal calculus, but no significant stone burden was noted on the left. He denies any significant changes in lower urinary tract symptoms. He denies difficulties voiding or decrease in urine output. He denies gross hematuria, dysuria, fever, chills, nausea, or vomiting. He does have hx of prior CVA managed with Plavix and CAD s/p CABG.     ALLERGIES: lyrica    MEDICATIONS: Sildenafil Citrate 100  mg tablet 1 tablet PO prn as directed  Levetiracetam 250 mg tablet Oral  Lipitor 40 mg tablet Oral  Losartan-Hydrochlorothiazide 50 mg-12.5 mg tablet  Nitrostat 0.4 mg tablet, sublingual Sublingual  Plavix 75 mg tablet Oral  Voltaren 75 mg tablet, delayed release Oral     GU PSH: Cystoscopy Insert Stent, Right - 07/15/2019 Ureteroscopic laser litho, Right - 06/06/2019 Ureteroscopic stone removal, Right - 07/15/2019       PSH Notes: Coronary Artery Four Or More Arterial Bypass Grafts   NON-GU PSH: Cabg; Arterial; Four Or More - 2016     GU PMH: Hydronephrosis - 09/19/2019 Ureteral calculus - 06/28/2019 Flank Pain - 05/10/2019 Microscopic hematuria - 05/10/2019 Renal calculus - 05/10/2019 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 Clyde Lundborg and Dr. Tammi Klippel. He saw me initially in consultation in September 2016 and re-established care with me in March 2018 to complete his care after Dr. Exie Parody left his practice.   His past medical history is significant for a prior CVA managed with Plavix and CAD s/p CABG. He does take daily nitrate medication. He does not regularly follow with a cardiologist. He does have resultant severe deficits in his left upper and left lower extremity. He is ambulatory with a walker.   TNM stage: cT3a N0 M0  PSA: 25.7  Gleason score: 4+4=8  Biopsy (05/25/15 - read by Dr. Cyndie Chime, Acc #  [id number]): 10/12 cores positive  Left: L lateral apex (62%, 3+3=6, PNI), L apex (53%, 3+3=6, PNI), L lateral mid (60%, 3+3=6, PNI), L mid (100%, 3+3=6)  Right: R apex (93%, 4+4=8), R lateral apex (67%, 4+4=8, PNI), R mid (50%, 4+4=8), R lateral mid (59%, 4+4=8, PNI), R base (85%, 4+4=8, PNI), R lateral base (92%, 4+4=8, PNI)  Prostate  volume: 80 cc   2) Urolithiasis: He has history of recurrent urolithiasis.   Jul 2020: R ureteroscopic laser lithotripsy 1.5 cm right renal pelvic stone  Jul 2020: Stent fell out early and required replacement  Aug 2020: R ureteroscopic removal of remaining fragments       NON-GU PMH: Anxiety, Anxiety - 2016 Arthritis Depression Hypercholesterolemia Hypertension Seizure disorder Stroke/TIA    FAMILY HISTORY: Myocardial Infarction - Runs In Family Strokes - Runs In Family   SOCIAL HISTORY: Marital Status: Married Preferred Language: English; Ethnicity: Not Hispanic Or Latino; Race: White Current Smoking Status: Patient has never smoked.   Tobacco Use Assessment Completed: Used Tobacco in last 30 days? Does not drink anymore.  Drinks 1 caffeinated drink per day.     Notes: Caffeine use, Never a smoker, Alcohol use   REVIEW OF SYSTEMS:    GU Review Male:   Patient reports frequent urination, hard to postpone urination, and get up at night to urinate. Patient denies burning/ pain with urination, leakage of urine, stream starts and stops, trouble starting your stream, have to strain to urinate , erection problems, and penile pain.  Gastrointestinal (Upper):   Patient denies nausea, vomiting, and indigestion/ heartburn.  Gastrointestinal (Lower):   Patient denies diarrhea and constipation.  Constitutional:   Patient denies fever, night sweats, weight loss, and fatigue.  Skin:   Patient denies skin rash/ lesion and itching.  Eyes:   Patient denies blurred vision and double vision.  Ears/ Nose/ Throat:   Patient denies sore throat and sinus problems.  Hematologic/Lymphatic:   Patient denies swollen glands and easy bruising.  Cardiovascular:   Patient denies leg swelling and chest pains.  Respiratory:   Patient denies cough and shortness of breath.  Endocrine:   Patient denies excessive thirst.  Musculoskeletal:   Patient reports back pain. Patient denies joint pain.   Neurological:   Patient denies headaches and dizziness.  Psychologic:   Patient denies depression and anxiety.   Notes: left back pain     VITAL SIGNS:      01/03/2020 10:49 AM  Weight 190 lb / 86.18 kg  Height 69 in / 175.26 cm  BP 181/118 mmHg  Pulse 73 /min  Temperature 97.8 F / 36.5 C  BMI 28.1 kg/m   MULTI-SYSTEM PHYSICAL EXAMINATION:    Constitutional: Well-nourished. No physical deformities. Normally developed. Good grooming.  Respiratory: No labored breathing, no use of accessory muscles.   Cardiovascular: Normal temperature, normal extremity pulses, no swelling, no varicosities.  Gastrointestinal: No CVA tenderness and no abdominal tenderness.  Musculoskeletal: Spine, ribs, pelvis left lumbar tenderness. Normal gait and station of head and neck.     PAST DATA REVIEWED:  Source Of History:  Patient  Records Review:   Previous Patient Records  Urine Test Review:   Urinalysis  X-Ray Review: C.T. Abdomen/Pelvis: Reviewed Films. Reviewed Report.     05/14/19 10/19/18 02/02/18 08/02/17 01/25/17  PSA  Total PSA 0.138 ng/mL 0.10 ng/mL 0.050 ng/mL <0.015 ng/dL < 0.015 ng/dl    02/02/18 08/02/17  Hormones  Testosterone, Total 276.1 ng/dL <10 ng/dL    PROCEDURES:  Renal Ultrasound (Limited) [id number]  RT Kidney: Length:11.5 cm Depth:5.7 cm Cortical Width: 0.90 cm Width: 6.9 cm    Right Kidney/Ureter:  Hydro noted, Multiple echogenic foci, largest LP= 0.82cm.  Bladder:  PVR= 16.75Ml      Patient confirmed No Neulasta OnPro Device.           Urinalysis w/Scope - 81001 Dipstick Dipstick Cont'd Micro  Color: Yellow Bilirubin: Neg WBC/hpf: 0 - 5/hpf  Appearance: Clear Ketones: Neg RBC/hpf: 0 - 2/hpf  Specific Gravity: 1.020 Blood: Neg Bacteria: Rare (0-9/hpf)  pH: 5.5 Protein: 1+ Cystals: NS (Not Seen)  Glucose: Neg Urobilinogen: 0.2 Casts: NS (Not Seen)    Nitrites: Neg Trichomonas: Not Present    Leukocyte Esterase: Neg Mucous: Present      Epithelial  Cells: 0 - 5/hpf      Yeast: NS (Not Seen)      Sperm: Not Present    Notes:      ASSESSMENT:      ICD-10 Details  1 GU:   Hydronephrosis - N13.0   2   Renal calculus - N20.0   3   Ureteral calculus - N20.1 Acute, Systemic Symptoms   PLAN:           Orders Labs Urine Culture          Document Letter(s):  Created for Patient: Clinical Summary         Notes:   Urinalysis today is without infectious parameters or hematuria. Precautionary culture was sent today. Right renal ultrasound today continues to show persistent hydronephrosis. We reviewed imaging findings in detail today and reviewed treatment options in detail. Given persistent right hydronephrosis, he would likely benefit from proceeding with intervention at this time. We reviewed ureteroscopy procedure and potential risk in detail today. He voiced understanding. He does complain of some left lower back pain today, which I feel is likely musculoskeletal based on location. Based on review of previous imaging he does have some small stones on the left, but no significant stone burden. Ultimately, I will review today's visit with his urologist for recommendations moving forward. He will continue to hydrate adequately. Strict return precautions reviewed in the interim for fever or progressive pain, nausea, vomiting, or oliguria. He voiced understanding.    * Signed by Azucena Fallen, NP on 01/03/20 at 4:31 PM (EST)*       APPENDED NOTES:  Mr. Krummen has persistent hydronephrosis on the right. He also has pain on the left. Will proceed with bilateral retrograde pyelography with at least right ureteroscopy and stone removal. Will try not to leave a stent at his request.     * Signed by Raynelle Bring, M.D. on 01/03/20 at 5:31 PM (EST)*

## 2020-01-30 ENCOUNTER — Ambulatory Visit (HOSPITAL_COMMUNITY): Payer: Medicare PPO | Admitting: Certified Registered Nurse Anesthetist

## 2020-01-30 ENCOUNTER — Encounter (HOSPITAL_COMMUNITY)
Admission: RE | Disposition: A | Discharge status: Home / Self Care | Payer: Self-pay | Source: Home / Self Care | Attending: Urology

## 2020-01-30 ENCOUNTER — Ambulatory Visit (HOSPITAL_COMMUNITY): Payer: Medicare PPO | Admitting: Physician Assistant

## 2020-01-30 ENCOUNTER — Other Ambulatory Visit: Payer: Self-pay

## 2020-01-30 ENCOUNTER — Ambulatory Visit (HOSPITAL_COMMUNITY)
Admission: RE | Admit: 2020-01-30 | Discharge: 2020-01-30 | Disposition: A | Discharge status: Home / Self Care | Payer: Medicare PPO | Attending: Urology | Admitting: Urology

## 2020-01-30 ENCOUNTER — Encounter (HOSPITAL_COMMUNITY): Payer: Self-pay | Admitting: Urology

## 2020-01-30 ENCOUNTER — Ambulatory Visit (HOSPITAL_COMMUNITY): Payer: Medicare PPO

## 2020-01-30 DIAGNOSIS — R519 Headache, unspecified: Secondary | ICD-10-CM | POA: Diagnosis not present

## 2020-01-30 DIAGNOSIS — Z8249 Family history of ischemic heart disease and other diseases of the circulatory system: Secondary | ICD-10-CM | POA: Diagnosis not present

## 2020-01-30 DIAGNOSIS — I69354 Hemiplegia and hemiparesis following cerebral infarction affecting left non-dominant side: Secondary | ICD-10-CM | POA: Diagnosis not present

## 2020-01-30 DIAGNOSIS — F329 Major depressive disorder, single episode, unspecified: Secondary | ICD-10-CM | POA: Diagnosis not present

## 2020-01-30 DIAGNOSIS — Z951 Presence of aortocoronary bypass graft: Secondary | ICD-10-CM | POA: Diagnosis not present

## 2020-01-30 DIAGNOSIS — F418 Other specified anxiety disorders: Secondary | ICD-10-CM | POA: Insufficient documentation

## 2020-01-30 DIAGNOSIS — N131 Hydronephrosis with ureteral stricture, not elsewhere classified: Secondary | ICD-10-CM | POA: Insufficient documentation

## 2020-01-30 DIAGNOSIS — Z823 Family history of stroke: Secondary | ICD-10-CM | POA: Diagnosis not present

## 2020-01-30 DIAGNOSIS — I10 Essential (primary) hypertension: Secondary | ICD-10-CM | POA: Insufficient documentation

## 2020-01-30 DIAGNOSIS — E7800 Pure hypercholesterolemia, unspecified: Secondary | ICD-10-CM | POA: Diagnosis not present

## 2020-01-30 DIAGNOSIS — Z7902 Long term (current) use of antithrombotics/antiplatelets: Secondary | ICD-10-CM | POA: Diagnosis not present

## 2020-01-30 DIAGNOSIS — Z791 Long term (current) use of non-steroidal anti-inflammatories (NSAID): Secondary | ICD-10-CM | POA: Insufficient documentation

## 2020-01-30 DIAGNOSIS — M1990 Unspecified osteoarthritis, unspecified site: Secondary | ICD-10-CM | POA: Diagnosis not present

## 2020-01-30 DIAGNOSIS — Z87891 Personal history of nicotine dependence: Secondary | ICD-10-CM | POA: Insufficient documentation

## 2020-01-30 DIAGNOSIS — G40909 Epilepsy, unspecified, not intractable, without status epilepticus: Secondary | ICD-10-CM | POA: Diagnosis not present

## 2020-01-30 DIAGNOSIS — Z87442 Personal history of urinary calculi: Secondary | ICD-10-CM | POA: Insufficient documentation

## 2020-01-30 DIAGNOSIS — Z79899 Other long term (current) drug therapy: Secondary | ICD-10-CM | POA: Insufficient documentation

## 2020-01-30 DIAGNOSIS — I2510 Atherosclerotic heart disease of native coronary artery without angina pectoris: Secondary | ICD-10-CM | POA: Insufficient documentation

## 2020-01-30 DIAGNOSIS — K219 Gastro-esophageal reflux disease without esophagitis: Secondary | ICD-10-CM | POA: Insufficient documentation

## 2020-01-30 DIAGNOSIS — I252 Old myocardial infarction: Secondary | ICD-10-CM | POA: Insufficient documentation

## 2020-01-30 DIAGNOSIS — M549 Dorsalgia, unspecified: Secondary | ICD-10-CM | POA: Diagnosis not present

## 2020-01-30 DIAGNOSIS — N135 Crossing vessel and stricture of ureter without hydronephrosis: Secondary | ICD-10-CM | POA: Diagnosis present

## 2020-01-30 DIAGNOSIS — C61 Malignant neoplasm of prostate: Secondary | ICD-10-CM | POA: Diagnosis not present

## 2020-01-30 HISTORY — PX: CYSTOSCOPY/URETEROSCOPY/HOLMIUM LASER/STENT PLACEMENT: SHX6546

## 2020-01-30 LAB — GLUCOSE, CAPILLARY
Glucose-Capillary: 96 mg/dL (ref 70–99)
Glucose-Capillary: 107 mg/dL — ABNORMAL HIGH (ref 70–99)

## 2020-01-30 SURGERY — CYSTOSCOPY/URETEROSCOPY/HOLMIUM LASER/STENT PLACEMENT
Anesthesia: General | Laterality: Bilateral

## 2020-01-30 MED ORDER — PROPOFOL 10 MG/ML IV BOLUS
INTRAVENOUS | Status: DC | PRN
  Administered 2020-01-30: 150 mg via INTRAVENOUS

## 2020-01-30 MED ORDER — FENTANYL CITRATE (PF) 100 MCG/2ML IJ SOLN
INTRAMUSCULAR | Status: AC
  Filled 2020-01-30: qty 2

## 2020-01-30 MED ORDER — LIDOCAINE 2% (20 MG/ML) 5 ML SYRINGE
INTRAMUSCULAR | Status: DC | PRN
  Administered 2020-01-30: 60 mg via INTRAVENOUS

## 2020-01-30 MED ORDER — FENTANYL CITRATE (PF) 100 MCG/2ML IJ SOLN
INTRAMUSCULAR | Status: DC | PRN
  Administered 2020-01-30 (×2): 50 ug via INTRAVENOUS

## 2020-01-30 MED ORDER — CEFAZOLIN SODIUM-DEXTROSE 2-4 GM/100ML-% IV SOLN
INTRAVENOUS | Status: AC
  Filled 2020-01-30: qty 100

## 2020-01-30 MED ORDER — SODIUM CHLORIDE 0.9 % IV SOLN
INTRAVENOUS | Status: AC
  Filled 2020-01-30: qty 2

## 2020-01-30 MED ORDER — ONDANSETRON HCL 4 MG/2ML IJ SOLN
INTRAMUSCULAR | Status: AC
  Filled 2020-01-30: qty 2

## 2020-01-30 MED ORDER — LACTATED RINGERS IV SOLN: INTRAVENOUS | Status: DC

## 2020-01-30 MED ORDER — DEXAMETHASONE SODIUM PHOSPHATE 10 MG/ML IJ SOLN
INTRAMUSCULAR | Status: AC
  Filled 2020-01-30: qty 1

## 2020-01-30 MED ORDER — HYDROCODONE-ACETAMINOPHEN 5-325 MG PO TABS: 1.0000 | ORAL_TABLET | Freq: Four times a day (QID) | ORAL | 0 refills | Status: DC | PRN

## 2020-01-30 MED ORDER — PROMETHAZINE HCL 25 MG/ML IJ SOLN: 6.2500 mg | INTRAMUSCULAR | Status: DC | PRN

## 2020-01-30 MED ORDER — FENTANYL CITRATE (PF) 100 MCG/2ML IJ SOLN: 25.0000 ug | INTRAMUSCULAR | Status: DC | PRN

## 2020-01-30 MED ORDER — MEPERIDINE HCL 50 MG/ML IJ SOLN: 6.2500 mg | INTRAMUSCULAR | Status: DC | PRN

## 2020-01-30 MED ORDER — ONDANSETRON HCL 4 MG/2ML IJ SOLN
INTRAMUSCULAR | Status: DC | PRN
  Administered 2020-01-30: 4 mg via INTRAVENOUS

## 2020-01-30 MED ORDER — CEFAZOLIN SODIUM-DEXTROSE 2-4 GM/100ML-% IV SOLN
2.0000 g | Freq: Once | INTRAVENOUS | Status: AC
  Administered 2020-01-30: 2 g via INTRAVENOUS

## 2020-01-30 MED ORDER — DEXAMETHASONE SODIUM PHOSPHATE 10 MG/ML IJ SOLN
INTRAMUSCULAR | Status: DC | PRN
  Administered 2020-01-30: 10 mg via INTRAVENOUS

## 2020-01-30 MED ORDER — PROPOFOL 10 MG/ML IV BOLUS
INTRAVENOUS | Status: AC
  Filled 2020-01-30: qty 20

## 2020-01-30 MED ORDER — SODIUM CHLORIDE 0.9 % IR SOLN
Status: DC | PRN
  Administered 2020-01-30: 3000 mL via INTRAVESICAL

## 2020-01-30 MED ORDER — IOHEXOL 300 MG/ML  SOLN
INTRAMUSCULAR | Status: DC | PRN
  Administered 2020-01-30: 25 mL via URETHRAL

## 2020-01-30 MED ORDER — MIDAZOLAM HCL 2 MG/2ML IJ SOLN
INTRAMUSCULAR | Status: AC
  Filled 2020-01-30: qty 2

## 2020-01-30 MED ORDER — MIDAZOLAM HCL 5 MG/5ML IJ SOLN
INTRAMUSCULAR | Status: DC | PRN
  Administered 2020-01-30: 2 mg via INTRAVENOUS

## 2020-01-30 SURGICAL SUPPLY — 22 items
BAG URO CATCHER STRL LF (MISCELLANEOUS) ×3 IMPLANT
BASKET ZERO TIP NITINOL 2.4FR (BASKET) IMPLANT
CATH INTERMIT  6FR 70CM (CATHETERS) ×3 IMPLANT
CLOTH BEACON ORANGE TIMEOUT ST (SAFETY) ×3 IMPLANT
FIBER LASER FLEXIVA 365 (UROLOGICAL SUPPLIES) IMPLANT
FIBER LASER TRAC TIP (UROLOGICAL SUPPLIES) IMPLANT
GLOVE BIOGEL M STRL SZ7.5 (GLOVE) ×9 IMPLANT
GOWN STRL REUS W/TWL LRG LVL3 (GOWN DISPOSABLE) ×6 IMPLANT
GUIDEWIRE ANG ZIPWIRE 038X150 (WIRE) IMPLANT
GUIDEWIRE STR DUAL SENSOR (WIRE) ×6 IMPLANT
IV NS 1000ML (IV SOLUTION) ×2
IV NS 1000ML BAXH (IV SOLUTION) ×1 IMPLANT
KIT BALLN UROMAX 15FX4 (MISCELLANEOUS) ×1 IMPLANT
KIT BALLN UROMAX 26 75X4 (MISCELLANEOUS) ×2
KIT TURNOVER KIT A (KITS) ×3 IMPLANT
MANIFOLD NEPTUNE II (INSTRUMENTS) ×3 IMPLANT
PACK CYSTO (CUSTOM PROCEDURE TRAY) ×3 IMPLANT
SHEATH URETERAL 12FRX35CM (MISCELLANEOUS) IMPLANT
STENT CONTOUR 8FR X 24 (STENTS) ×3 IMPLANT
TUBING CONNECTING 10 (TUBING) ×2 IMPLANT
TUBING CONNECTING 10' (TUBING) ×1
TUBING UROLOGY SET (TUBING) ×3 IMPLANT

## 2020-01-30 NOTE — Interval H&P Note (Signed)
History and Physical Interval Note:  01/30/2020 1:37 PM  Arthur Cisneros.  has presented today for surgery, with the diagnosis of RIGHT URETERAL CALCULI, LEFT FLANK PAIN.  The various methods of treatment have been discussed with the patient and family. After consideration of risks, benefits and other options for treatment, the patient has consented to  Procedure(s): CYSTOSCOPY/RETROGRADE/URETEROSCOPY/HOLMIUM LASER/STENT PLACEMENT (Bilateral) as a surgical intervention.  The patient's history has been reviewed, patient examined, no change in status, stable for surgery.  I have reviewed the patient's chart and labs.  Questions were answered to the patient's satisfaction.     [employer]

## 2020-01-30 NOTE — Discharge Instructions (Signed)

## 2020-01-30 NOTE — Transfer of Care (Signed)
Immediate Anesthesia Transfer of Care Note  Patient: Arthur Cisneros.  Procedure(s) Performed: CYSTOSCOPY/RETROGRADE/URETEROSCOPY /RIGHT STENT PLACEMENT (Bilateral )  Patient Location: PACU  Anesthesia Type:General  Level of Consciousness: awake, alert , oriented and patient cooperative  Airway & Oxygen Therapy: Patient Spontanous Breathing and Patient connected to face mask oxygen  Post-op Assessment: Report given to RN, Post -op Vital signs reviewed and stable and Patient moving all extremities  Post vital signs: Reviewed and stable  Last Vitals:  Vitals Value Taken Time  BP 158/88 01/30/20 1454  Temp    Pulse 77 01/30/20 1500  Resp 19 01/30/20 1500  SpO2 100 % 01/30/20 1500  Vitals shown include unvalidated device data.  Last Pain:  Vitals:   01/30/20 1216  TempSrc: Oral      Patients Stated Pain Goal: 4 ([id number])  Complications: No apparent anesthesia complications

## 2020-01-30 NOTE — Op Note (Signed)
Preoperative diagnosis: Right ureteral calculi, left lower quadrant pain  Postoperative diagnosis: Right ureteral stricture, left lower quadrant pain  Procedure: 1.  Cystoscopy 2.  Bilateral retrograde pyelography with interpretation 3.  Right ureteroscopy 4.  Balloon dilation of right ureteral stricture 5.  Right ureteral stent placement (8 x 24-no string)  Surgeon: Pryor Curia MD  Anesthesia: General  Complications: None  EBL: None  Intraoperative findings: Bilateral retrograde pyelography was performed with a 6 French ureteral catheter and Omnipaque contrast.  Findings indicated a normal caliber left ureter and left renal collecting system without filling defects.  On the right side, there was noted to be an area of distal narrowing within the distal right ureter possibly consistent with his previously noted stones versus other abnormality.  No other filling defects were noted in the more proximal ureter or renal collecting system.  Indication: Arthur Cisneros is a 64 year old gentleman with a history of recurrent urolithiasis.  He recently was undergoing follow-up and was noted to have persistent right-sided hydronephrosis.  He eventually underwent further definitive imaging with a CT scan revealing a few small remaining distal ureteral fragments.  After an extended period of time to try to pass these fragments, he presents today for definitive treatment.  He is unsure whether he is passed any fragments.  We reviewed the potential risks, complications, and the alternative options as well as the expected recovery process.  He gives informed consent to proceed.  He also mentioned that he has been having left-sided lower quadrant pain that feels similar to his prior stone episodes.  We therefore agreed to do a retrograde pyelogram on the left side to ensure that he has not developed any evidence of obstruction on the left.  Description of procedure: The patient was taken to the operating  room and a general anesthetic was administered.  He was given preoperative antibiotics, placed in the dorsolithotomy position, and prepped and draped in usual sterile fashion.  A preoperative timeout was performed.  Cystourethroscopy was then performed.  The urethra and bladder were unremarkable.  Attention turned to the left ureteral orifice and this was intubated with a 6 French ureteral catheter.  Omnipaque contrast was injected.  No abnormalities were noted as stated above.  Attention then turned to the right ureteral orifice.  This was intubated with a 6 French ureteral catheter.  Omnipaque contrast was injected and there was noted to be some narrowing within the distal ureter approximately 3 cm above the ureteral orifice.  It was felt that this could potentially represent the previously noted stone fragments in this vicinity.  A 0.38 sensor guidewire was advanced up into the right ureter into the renal pelvis under fluoroscopic guidance without resistance.  The 6 French semirigid ureteroscope was then passed next to the wire into the distal ureter.  However, this could only be advanced approximately 3 cm before there was noted to be significant narrowing of the ureter that would not allow passage of the ureteroscope.  I inserted a second sensor guidewire that helped distend the ureter in this area.  I was able to eventually manipulate the ureteroscope past this site of stricture.  No stones were noted and the ureteroscope was able to be advanced up into the proximal ureter with no stones noted anywhere along the course of the ureter.  The ureteroscope was then withdrawn to the area of stricture.  This did appear to be a significant stricture although only measured approximately 1 cm in length.  It was  decided to proceed with balloon dilation.  Therefore, the ureteroscope was removed and the 15 French ureteral dilating balloon with a length of 4 cm was passed over the wire under fluoroscopic guidance and  across the area of stricture.  This was then inflated with contrast to a pressure of 16 mmHg and left inflated for 5 minutes.  All waste was seen to have been removed from the dilating balloon.  The balloon was then deflated and removed.  Reinspection with the semirigid ureteroscope revealed adequate dilation of the stricture.  The ureteroscope was then removed and replaced with the cystoscope was backloaded on the remaining wire.  An 8 x 24 double-J ureteral stent was then advanced over the wire using Seldinger technique.  It was appropriately positioned under fluoroscopic and cystoscopic guidance.  The wire was then removed with a good curl noted in the renal pelvis as well as within the bladder.  The patient tolerated the procedure well without complications.  He was able to be awakened and transferred to the recovery unit in satisfactory condition

## 2020-01-30 NOTE — Anesthesia Postprocedure Evaluation (Signed)
Anesthesia Post Note  Patient: Arthur Cisneros.  Procedure(s) Performed: CYSTOSCOPY/RETROGRADE/URETEROSCOPY /RIGHT STENT PLACEMENT (Bilateral )     Patient location during evaluation: PACU Anesthesia Type: General Level of consciousness: sedated and patient cooperative Pain management: pain level controlled Vital Signs Assessment: post-procedure vital signs reviewed and stable Respiratory status: spontaneous breathing Cardiovascular status: stable Anesthetic complications: no    Last Vitals:  Vitals:   01/30/20 1534 01/30/20 1600  BP: (!) 171/100 (!) 157/94  Pulse: 70 67  Resp: 16 16  Temp: 36.7 C 36.9 C  SpO2: 96%     Last Pain:  Vitals:   01/30/20 1534  TempSrc: Oral  PainSc: 0-No pain                 Nolon Nations

## 2020-01-30 NOTE — Anesthesia Procedure Notes (Signed)
Procedure Name: Intubation Date/Time: 01/30/2020 2:06 PM Performed by: Mitzie Na, CRNA Pre-anesthesia Checklist: Patient identified, Emergency Drugs available, Suction available and Patient being monitored Patient Re-evaluated:Patient Re-evaluated prior to induction Oxygen Delivery Method: Circle system utilized Preoxygenation: Pre-oxygenation with 100% oxygen Induction Type: IV induction LMA: LMA inserted LMA Size: 4.0 Number of attempts: 1 Placement Confirmation: positive ETCO2 and breath sounds checked- equal and bilateral Tube secured with: Tape Dental Injury: Teeth and Oropharynx as per pre-operative assessment

## 2020-01-30 NOTE — Anesthesia Preprocedure Evaluation (Signed)
Anesthesia Evaluation  Patient identified by MRN, date of birth, ID band Patient awake    Reviewed: Allergy & Precautions, NPO status , Patient's Chart, lab work & pertinent test results  History of Anesthesia Complications Negative for: history of anesthetic complications  Airway Mallampati: II  TM Distance: >3 FB Neck ROM: Full    Dental  (+) Edentulous Upper, Edentulous Lower   Pulmonary former smoker,  07/11/2019 SARS coronavirus NEG   breath sounds clear to auscultation       Cardiovascular hypertension, Pt. on medications (-) angina+ CAD and + CABG   Rhythm:Regular Rate:Normal  '16 ECHO: EF 55-60%, mild aortic root dilation, valves OK '16 Stress:  prior myocardial infarction in the inferior and inferolateral walls with no evidence of ischemia   Neuro/Psych  Headaches, Seizures -, Well Controlled,  Anxiety Depression CVA (L sided weakness), Residual Symptoms    GI/Hepatic Neg liver ROS, GERD  Controlled,  Endo/Other  diabetes, Oral Hypoglycemic Agents  Renal/GU Renal diseasestones   Prostate cancer    Musculoskeletal  (+) Arthritis ,   Abdominal   Peds  Hematology negative hematology ROS (+)   Anesthesia Other Findings   Reproductive/Obstetrics                             Anesthesia Physical  Anesthesia Plan  ASA: III  Anesthesia Plan: General   Post-op Pain Management:    Induction: Intravenous  PONV Risk Score and Plan: 4 or greater and Ondansetron, Treatment may vary due to age or medical condition and Dexamethasone  Airway Management Planned: LMA  Additional Equipment:   Intra-op Plan:   Post-operative Plan: Extubation in OR  Informed Consent: I have reviewed the patients History and Physical, chart, labs and discussed the procedure including the risks, benefits and alternatives for the proposed anesthesia with the patient or authorized representative who has  indicated his/her understanding and acceptance.     Dental advisory given  Plan Discussed with: CRNA  Anesthesia Plan Comments: (See PAT note Konrad Felix, PA-C)        Anesthesia Quick Evaluation

## 2020-02-05 ENCOUNTER — Ambulatory Visit: Payer: Medicare PPO | Admitting: Cardiovascular Disease

## 2020-02-05 ENCOUNTER — Encounter: Payer: Self-pay | Admitting: Cardiovascular Disease

## 2020-02-05 ENCOUNTER — Other Ambulatory Visit: Payer: Self-pay

## 2020-02-05 VITALS — BP 145/90 | HR 69 | Temp 90.9°F | Ht 69.0 in | Wt 199.0 lb

## 2020-02-05 DIAGNOSIS — I10 Essential (primary) hypertension: Secondary | ICD-10-CM

## 2020-02-05 DIAGNOSIS — I6523 Occlusion and stenosis of bilateral carotid arteries: Secondary | ICD-10-CM | POA: Diagnosis not present

## 2020-02-05 DIAGNOSIS — I25708 Atherosclerosis of coronary artery bypass graft(s), unspecified, with other forms of angina pectoris: Secondary | ICD-10-CM | POA: Diagnosis not present

## 2020-02-05 DIAGNOSIS — Z8673 Personal history of transient ischemic attack (TIA), and cerebral infarction without residual deficits: Secondary | ICD-10-CM

## 2020-02-05 DIAGNOSIS — E785 Hyperlipidemia, unspecified: Secondary | ICD-10-CM | POA: Diagnosis not present

## 2020-02-05 NOTE — Patient Instructions (Signed)
Medication Instructions:  Your physician recommends that you continue on your current medications as directed. Please refer to the Current Medication list given to you today.  *If you need a refill on your cardiac medications before your next appointment, please call your pharmacy*   Lab Work: None today If you have labs (blood work) drawn today and your tests are completely normal, you will receive your results only by: . MyChart Message (if you have MyChart) OR . A paper copy in the mail If you have any lab test that is abnormal or we need to change your treatment, we will call you to review the results.   Testing/Procedures: None today   Follow-Up: At [facility], you and your health needs are our priority.  As part of our continuing mission to provide you with exceptional heart care, we have created designated Provider Care Teams.  These Care Teams include your primary Cardiologist (physician) and Advanced Practice Providers (APPs -  Physician Assistants and Nurse Practitioners) who all work together to provide you with the care you need, when you need it.  We recommend signing up for the patient portal called "MyChart".  Sign up information is provided on this After Visit Summary.  MyChart is used to connect with patients for Virtual Visits (Telemedicine).  Patients are able to view lab/test results, encounter notes, upcoming appointments, etc.  Non-urgent messages can be sent to your provider as well.   To learn more about what you can do with MyChart, go to [email or web address].    Your next appointment:   12 month(s)  The format for your next appointment:   In Person  Provider:   Suresh Koneswaran, MD   Other Instructions None      Thank you for choosing [facility] !         

## 2020-02-05 NOTE — Progress Notes (Signed)
SUBJECTIVE: The patient presents for routine follow-up.  He recently underwent balloon dilatation of a right ureteral stricture with stent placement on 01/30/2020.  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  Echocardiogram performed on 09/30/15 demonstrated normal left ventricular systolic function, EF 0000000, mild LVH, grade 1 diastolic dysfunction, and mild aortic root dilatation.  Carotid Dopplers on 01/21/2019 showed occluded right ICA and 1 to 39% left ICA stenosis.  He underwent CABG in 2000 at [facility]. 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 He was diagnosed with prostate cancer on 06/09/15.  Nuclear myocardial perfusion study 10/02/15 demonstrated prior myocardial infarction in the inferior and inferolateral walls with no evidence of ischemia. It was deemed a low risk study.  He is here with his wife of over 5 years.  He denies chest pain, palpitations, and leg swelling.  He seldom gets short of breath.  He has right groin pain where the stent was placed.      Review of Systems: As per "subjective", otherwise negative.  Allergies  Allergen Reactions  . 5-Alpha Reductase Inhibitors   . Lyrica [Pregabalin]     hallucination  . Motrin [Ibuprofen]     "dr said I can not take it."  . Tramadol     Hallucinations, "legs jumping", increased appetite    Current Outpatient Medications  Medication Sig Dispense Refill  . atorvastatin (LIPITOR) 40 MG tablet Take 40 mg by mouth at bedtime.     . clopidogrel (PLAVIX) 75 MG tablet Take 75 mg by mouth daily.    Marland Kitchen HYDROcodone-acetaminophen (NORCO/VICODIN) 5-325 MG tablet Take 1-2 tablets by mouth every 6 (six) hours as needed. 20 tablet 0  . levETIRAcetam (KEPPRA) 250 MG tablet Take 1 tablet (250 mg total) by mouth 2 (two) times daily. 180 tablet 3  . losartan (COZAAR) 25 MG  tablet Take 25 mg by mouth daily.    . metFORMIN (GLUCOPHAGE-XR) 500 MG 24 hr tablet Take 1,000 mg by mouth 2 (two) times a day.     . miconazole (MICOTIN) 2 % powder Apply topically as needed for itching.    . nitroGLYCERIN (NITROSTAT) 0.4 MG SL tablet Place 0.4 mg under the tongue every 5 (five) minutes as needed for chest pain.     Marland Kitchen tiZANidine (ZANAFLEX) 2 MG tablet TAKE 1 TABLET BY MOUTH AT BEDTIME AS NEEDED. (Patient taking differently: Take 2 mg by mouth at bedtime. ) 30 tablet 11  . acetaminophen (TYLENOL) 500 MG tablet Take 1,000 mg by mouth every 6 (six) hours as needed (pain).     Marland Kitchen HYDROcodone-acetaminophen (NORCO/VICODIN) 5-325 MG tablet Take 1-2 tablets by mouth every 6 (six) hours as needed. 20 tablet 0   No current facility-administered medications for this visit.    Past Medical History:  Diagnosis Date  . Anxiety    pt denies  . Coronary artery disease   . Depression    at times  . Diabetes mellitus without complication ([place])    type 2  . History of kidney stones   . Hyperlipidemia   . Hypertension   . Kidney stones   . Prostate cancer ([place]) 05-2015   radiation CC in [place]  . Stroke [facility])    left side weakness with drop foot   2011    Past Surgical History:  Procedure Laterality Date  . CORONARY ARTERY BYPASS GRAFT  4 graft  . CYSTOSCOPY WITH STENT PLACEMENT Right 06/11/2019   Procedure: CYSTOSCOPY WITH STENT PLACEMENT;  Surgeon: Lucas Mallow, MD;  Location: [facility];  Service: Urology;  Laterality: Right;  . CYSTOSCOPY/URETEROSCOPY/HOLMIUM LASER/STENT PLACEMENT Right 06/06/2019   Procedure: CYSTOSCOPY/URETEROSCOPY/HOLMIUM LASER/STENT PLACEMENT;  Surgeon: Raynelle Bring, MD;  Location: [facility];  Service: Urology;  Laterality: Right;  . CYSTOSCOPY/URETEROSCOPY/HOLMIUM LASER/STENT PLACEMENT Right 07/15/2019   Procedure: CYSTOSCOPY/URETEROSCOPY/STENT PLACEMENT;  Surgeon: Raynelle Bring, MD;  Location: [facility];  Service: Urology;  Laterality: Right;  .  CYSTOSCOPY/URETEROSCOPY/HOLMIUM LASER/STENT PLACEMENT Bilateral 01/30/2020   Procedure: CYSTOSCOPY/RETROGRADE/URETEROSCOPY /RIGHT STENT PLACEMENT;  Surgeon: Raynelle Bring, MD;  Location: [facility];  Service: Urology;  Laterality: Bilateral;  . PROSTATE BIOPSY    . right hand surgery      Social History   Socioeconomic History  . Marital status: Married    Spouse name: [name]  . Number of children: 5  . Years of education: 8th   . Highest education level: Not on file  Occupational History    Employer: RETIRED  Tobacco Use  . Smoking status: Former Smoker    Packs/day: 0.25    Years: 15.00    Pack years: 3.75    Types: Cigarettes    Start date: 10/19/1970    Quit date: 10/19/1985    Years since quitting: 34.3  . Smokeless tobacco: Current User    Types: Chew  Substance and Sexual Activity  . Alcohol use: Not Currently    Alcohol/week: 0.0 standard drinks  . Drug use: No  . Sexual activity: Not Currently  Other Topics Concern  . Not on file  Social History Narrative   Patient lives at home with wife [name].    Patient has 5 children.    Patient has a 8th grade.    Patient is retired.    Patient is on disability.    Patient is right handed.    Patient drinks caffeine every once in a while.    Social Determinants of Health   Financial Resource Strain:   . Difficulty of Paying Living Expenses:   Food Insecurity:   . Worried About Charity fundraiser in the Last Year:   . Arboriculturist in the Last Year:   Transportation Needs:   . Film/video editor (Medical):   Marland Kitchen Lack of Transportation (Non-Medical):   Physical Activity:   . Days of Exercise per Week:   . Minutes of Exercise per Session:   Stress:   . Feeling of Stress :   Social Connections:   . Frequency of Communication with Friends and Family:   . Frequency of Social Gatherings with Friends and Family:   . Attends Religious Services:   . Active Member of Clubs or Organizations:   . Attends Theatre manager Meetings:   Marland Kitchen Marital Status:   Intimate Partner Violence:   . Fear of Current or Ex-Partner:   . Emotionally Abused:   Marland Kitchen Physically Abused:   . Sexually Abused:      Vitals:   02/05/20 0902  BP: (!) 145/90  Pulse: 69  Temp: (!) 90.9 F (32.7 C)  Height: 5\' 9"  (1.753 m)    Wt Readings from Last 3 Encounters:  01/30/20 199 lb 15.9 oz (90.7 kg)  01/23/20 200 lb (90.7 kg)  01/20/20 199 lb (90.3 kg)     PHYSICAL EXAM General: NAD HEENT: Normal. Neck: No JVD, no thyromegaly. Lungs: Clear to auscultation bilaterally with normal respiratory effort. CV:  Regular rate and rhythm, normal S1/S2, no S3/S4, no murmur. No pretibial or periankle edema.  No carotid bruit.   Abdomen: Soft, nontender, no distention.  Neurologic: Alert and oriented.  Psych: Normal affect. Skin: Normal. Musculoskeletal: No gross deformities.      Labs: Lab Results  Component Value Date/Time   K 3.8 01/23/2020 01:27 PM   BUN 15 01/23/2020 01:27 PM   CREATININE 1.14 01/23/2020 01:27 PM   ALT 14 06/11/2019 07:38 AM   HGB 14.8 01/23/2020 01:27 PM     Lipids: Lab Results  Component Value Date/Time   LDLCALC (H) 07/09/2010 03:40 AM    127        Total Cholesterol/HDL:CHD Risk Coronary Heart Disease Risk Table                     Men   Women  1/2 Average Risk   3.4   3.3  Average Risk       5.0   4.4  2 X Average Risk   9.6   7.1  3 X Average Risk  23.4   11.0        Use the calculated Patient Ratio above and the CHD Risk Table to determine the patient's CHD Risk.        ATP III CLASSIFICATION (LDL):  <100     mg/dL   Optimal  100-129  mg/dL   Near or Above                    Optimal  130-159  mg/dL   Borderline  160-189  mg/dL   High  >190     mg/dL   Very High   CHOL  07/09/2010 03:40 AM    193        ATP III CLASSIFICATION:  <200     mg/dL   Desirable  200-239  mg/dL   Borderline High  >=240    mg/dL   High          TRIG 174 (H) 07/09/2010 03:40 AM   HDL 31 (L)  07/09/2010 03:40 AM       ASSESSMENT AND PLAN:  1.  Coronary artery disease:He underwent four-vessel CABG in 2000 at [facility]. Symptomatically stable. Stress test and echocardiogram results reviewed above. Continue atorvastatin, clopidogrel, andlosartan.  He takes clopidogrel for history of CVA.  2. Essential HTN: Elevated in our office but they routinely check it at home and it was most recently 107/82. No changes to therapy.  3. Hyperlipidemia:Continue atorvastatin 40 mg. I will obtain a copy of most recent lipids.  4. CVA: Continue clopidogrel and atorvastatin.  5. Bilateral carotid artery stenosis: Dopplers fromMarch 2020 reviewed above which demonstrated previously known total occlusion of the right ICA and 1 to 39% stenosis of the left ICA. Continue clopidogrel and atorvastatin. Follows with vascular surgery   Disposition: Follow up 1 year   Kate Sable, M.D., F.A.C.C.

## 2020-02-07 ENCOUNTER — Ambulatory Visit: Payer: Medicare Other | Admitting: Cardiovascular Disease

## 2020-02-19 ENCOUNTER — Telehealth: Payer: Self-pay | Admitting: Adult Health

## 2020-02-19 MED ORDER — TIZANIDINE HCL 2 MG PO TABS: ORAL_TABLET | ORAL | 11 refills | Status: DC

## 2020-02-19 NOTE — Telephone Encounter (Signed)
1) Medication(s) Requested (by name): tiZANidine (ZANAFLEX) 2 MG tablet   2) Pharmacy of Choice: [place], [place]  [address] [place] [address]    Pt is out

## 2020-10-19 ENCOUNTER — Other Ambulatory Visit: Payer: Self-pay | Admitting: Adult Health

## 2020-10-19 NOTE — Telephone Encounter (Signed)
Attempted to reach patient and advise him our providers don't manage his diabetes. He needs to call primary provider for metformin refills. No answer, no VM.

## 2020-10-19 NOTE — Telephone Encounter (Signed)
Pt is needing a refill on his metFORMIN (GLUCOPHAGE-XR) 500 MG 24 hr tablet sent in to the [facility] in [place]

## 2020-10-19 NOTE — Telephone Encounter (Signed)
I called pt and let him know that we do not prescribe his metformin.  He will see pcp tomorrow and get it then.  Appreciated call.

## 2020-11-17 ENCOUNTER — Other Ambulatory Visit: Payer: Self-pay | Admitting: Adult Health

## 2020-11-26 ENCOUNTER — Other Ambulatory Visit: Payer: Self-pay | Admitting: Family Medicine

## 2020-11-26 ENCOUNTER — Other Ambulatory Visit (HOSPITAL_COMMUNITY): Payer: Self-pay | Admitting: Family Medicine

## 2020-11-26 DIAGNOSIS — I739 Peripheral vascular disease, unspecified: Secondary | ICD-10-CM

## 2020-11-26 DIAGNOSIS — I6523 Occlusion and stenosis of bilateral carotid arteries: Secondary | ICD-10-CM

## 2020-11-27 ENCOUNTER — Other Ambulatory Visit: Payer: Self-pay | Admitting: Nurse Practitioner

## 2020-11-27 DIAGNOSIS — N130 Hydronephrosis with ureteropelvic junction obstruction: Secondary | ICD-10-CM

## 2020-11-27 DIAGNOSIS — N135 Crossing vessel and stricture of ureter without hydronephrosis: Secondary | ICD-10-CM

## 2020-12-03 ENCOUNTER — Other Ambulatory Visit: Payer: Self-pay

## 2020-12-03 ENCOUNTER — Encounter (HOSPITAL_COMMUNITY)
Admission: RE | Admit: 2020-12-03 | Discharge: 2020-12-03 | Disposition: A | Discharge status: Home / Self Care | Payer: Medicare PPO | Source: Ambulatory Visit | Attending: Nurse Practitioner | Admitting: Nurse Practitioner

## 2020-12-03 DIAGNOSIS — N130 Hydronephrosis with ureteropelvic junction obstruction: Secondary | ICD-10-CM | POA: Diagnosis not present

## 2020-12-03 DIAGNOSIS — N135 Crossing vessel and stricture of ureter without hydronephrosis: Secondary | ICD-10-CM | POA: Diagnosis present

## 2020-12-03 MED ORDER — TECHNETIUM TC 99M MERTIATIDE
5.2000 | Freq: Once | INTRAVENOUS | Status: AC | PRN
  Administered 2020-12-03: 5.2 via INTRAVENOUS

## 2020-12-03 MED ORDER — FUROSEMIDE 10 MG/ML IJ SOLN: 45.0000 mg | Freq: Once | INTRAMUSCULAR | Status: AC

## 2020-12-03 MED ORDER — FUROSEMIDE 10 MG/ML IJ SOLN
INTRAMUSCULAR | Status: AC
  Administered 2020-12-03: 45 mg via INTRAVENOUS
  Filled 2020-12-03: qty 8

## 2020-12-04 ENCOUNTER — Ambulatory Visit (HOSPITAL_COMMUNITY): Admission: RE | Admit: 2020-12-04 | Payer: Medicare PPO | Source: Ambulatory Visit

## 2020-12-04 ENCOUNTER — Encounter (HOSPITAL_COMMUNITY): Payer: Self-pay

## 2020-12-07 ENCOUNTER — Ambulatory Visit (HOSPITAL_COMMUNITY)
Admission: RE | Admit: 2020-12-07 | Discharge: 2020-12-07 | Disposition: A | Discharge status: Home / Self Care | Payer: Medicare PPO | Source: Ambulatory Visit | Attending: Family Medicine | Admitting: Family Medicine

## 2020-12-07 ENCOUNTER — Other Ambulatory Visit: Payer: Self-pay

## 2020-12-07 DIAGNOSIS — I6523 Occlusion and stenosis of bilateral carotid arteries: Secondary | ICD-10-CM | POA: Insufficient documentation

## 2020-12-07 DIAGNOSIS — I739 Peripheral vascular disease, unspecified: Secondary | ICD-10-CM | POA: Diagnosis present

## 2021-01-15 ENCOUNTER — Telehealth: Payer: Self-pay | Admitting: Adult Health

## 2021-01-15 NOTE — Telephone Encounter (Signed)
Pt's wife, Larin Weissberg (on [place]) called, insurance information for:  [place] Prescriber ID: [id number] Group no: [id number] BIN RX: [id number] PCN: [id number]

## 2021-01-18 NOTE — Telephone Encounter (Signed)
Noted  

## 2021-01-21 ENCOUNTER — Ambulatory Visit (INDEPENDENT_AMBULATORY_CARE_PROVIDER_SITE_OTHER): Payer: Medicare Other | Admitting: Adult Health

## 2021-01-21 ENCOUNTER — Other Ambulatory Visit: Payer: Self-pay

## 2021-01-21 ENCOUNTER — Encounter: Payer: Self-pay | Admitting: Adult Health

## 2021-01-21 VITALS — BP 128/83 | HR 68 | Ht 69.0 in | Wt 206.0 lb

## 2021-01-21 DIAGNOSIS — G40909 Epilepsy, unspecified, not intractable, without status epilepticus: Secondary | ICD-10-CM

## 2021-01-21 DIAGNOSIS — I6522 Occlusion and stenosis of left carotid artery: Secondary | ICD-10-CM | POA: Diagnosis not present

## 2021-01-21 DIAGNOSIS — I6521 Occlusion and stenosis of right carotid artery: Secondary | ICD-10-CM | POA: Diagnosis not present

## 2021-01-21 DIAGNOSIS — Z8673 Personal history of transient ischemic attack (TIA), and cerebral infarction without residual deficits: Secondary | ICD-10-CM

## 2021-01-21 MED ORDER — TIZANIDINE HCL 2 MG PO TABS: ORAL_TABLET | ORAL | 11 refills | Status: DC

## 2021-01-21 NOTE — Progress Notes (Signed)
[facility]  PATIENT: Arthur Cisneros. DOB: Nov 17, 1955   REASON FOR VISIT: Follow-up for history of stroke and post stroke seizure HISTORY FROM: Patient  Chief complaint: Chief Complaint  Patient presents with  . Follow-up    RM  10  with wife (peggy) Pt is well, still having some L sided weakness and immobility      HISTORY OF PRESENT ILLNESS:  Arthur Cisneros. is a 65 y.o. 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, seizure event 2012 likely as late effect of stroke, bilateral carotid stenosis, HTN, HLD, DM and CAD  Today, 01/21/2021, Arthur Cisneros returns for 1 year routine follow-up for seizures and history of stroke accompanied by his wife  Denies new or worsening stroke/TIA symptoms Residual left spastic hemiparesis stable -he is on tizanidine and voltaran gel as needed for spasticity Compliant on Plavix and atorvastatin -denies side effects Blood pressure today 128/83 - monitors at home which has been stable Glucose levels stable with recent A1c 5.6 per wife (unable to view via epic) Lab work recently completed by PCP Prior carotid duplex 01/2019 stable right ICA occlusion and left ICA 1 to 39% stenosis  Denies any additional seizure events Compliant on Keppra 250 mg twice daily -denies side effects  No new concerns at this time      REVIEW OF SYSTEMS: Full 14 system review of systems performed and notable only for those listed, all others are neg: Weakness, speech difficulty, pain and all other systems negative  ALLERGIES: Allergies  Allergen Reactions  . 5-Alpha Reductase Inhibitors   . Lyrica [Pregabalin]     hallucination  . Motrin [Ibuprofen]     "dr said I can not take it."  . Tramadol     Hallucinations, "legs jumping", increased appetite    HOME MEDICATIONS: Outpatient Medications Prior to Visit  Medication Sig Dispense Refill  . acetaminophen (TYLENOL) 500 MG tablet Take 1,000  mg by mouth every 6 (six) hours as needed (pain).     Marland Kitchen amLODipine (NORVASC) 5 MG tablet Take 5 mg by mouth daily.    Marland Kitchen atorvastatin (LIPITOR) 40 MG tablet Take 40 mg by mouth at bedtime.     . cholecalciferol (VITAMIN D) 25 MCG (1000 UNIT) tablet Take 1,000 Units by mouth daily.    . clopidogrel (PLAVIX) 75 MG tablet Take 75 mg by mouth daily.    Marland Kitchen levETIRAcetam (KEPPRA) 250 MG tablet TAKE 1 TABLET BY MOUTH TWICE DAILY 180 tablet 3  . losartan (COZAAR) 25 MG tablet Take 25 mg by mouth daily.    . metFORMIN (GLUCOPHAGE-XR) 500 MG 24 hr tablet Take 1,000 mg by mouth 2 (two) times a day.     . nitroGLYCERIN (NITROSTAT) 0.4 MG SL tablet Place 0.4 mg under the tongue every 5 (five) minutes as needed for chest pain.     Marland Kitchen tiZANidine (ZANAFLEX) 2 MG tablet TAKE 1 TABLET BY MOUTH AT BEDTIME AS NEEDED. 30 tablet 11  . HYDROcodone-acetaminophen (NORCO/VICODIN) 5-325 MG tablet Take 1-2 tablets by mouth every 6 (six) hours as needed. 20 tablet 0  . HYDROcodone-acetaminophen (NORCO/VICODIN) 5-325 MG tablet Take 1-2 tablets by mouth every 6 (six) hours as needed. 20 tablet 0  . miconazole (MICOTIN) 2 % powder Apply topically as needed for itching.     No facility-administered medications prior to visit.    PAST MEDICAL HISTORY: Past Medical History:  Diagnosis Date  . Anxiety  pt denies  . Coronary artery disease   . Depression    at times  . Diabetes mellitus without complication ([place])    type 2  . History of kidney stones   . Hyperlipidemia   . Hypertension   . Kidney stones   . Prostate cancer ([place]) 05-2015   radiation CC in [place]  . Stroke [facility])    left side weakness with drop foot   2011    PAST SURGICAL HISTORY: Past Surgical History:  Procedure Laterality Date  . CORONARY ARTERY BYPASS GRAFT     4 graft  . CYSTOSCOPY WITH STENT PLACEMENT Right 06/11/2019   Procedure: CYSTOSCOPY WITH STENT PLACEMENT;  Surgeon: Lucas Mallow, MD;  Location: [facility];  Service: Urology;   Laterality: Right;  . CYSTOSCOPY/URETEROSCOPY/HOLMIUM LASER/STENT PLACEMENT Right 06/06/2019   Procedure: CYSTOSCOPY/URETEROSCOPY/HOLMIUM LASER/STENT PLACEMENT;  Surgeon: Raynelle Bring, MD;  Location[facility];  Service: Urology;  Laterality: Right;  . CYSTOSCOPY/URETEROSCOPY/HOLMIUM LASER/STENT PLACEMENT Right 07/15/2019   Procedure: CYSTOSCOPY/URETEROSCOPY/STENT PLACEMENT;  Surgeon: Raynelle Bring, MD;  Location: [facility];  Service: Urology;  Laterality: Right;  . CYSTOSCOPY/URETEROSCOPY/HOLMIUM LASER/STENT PLACEMENT Bilateral 01/30/2020   Procedure: CYSTOSCOPY/RETROGRADE/URETEROSCOPY /RIGHT STENT PLACEMENT;  Surgeon: Raynelle Bring, MD;  Location: [facility];  Service: Urology;  Laterality: Bilateral;  . PROSTATE BIOPSY    . right hand surgery      FAMILY HISTORY: Family History  Problem Relation Age of Onset  . Heart disease Mother   . Diabetes Mother   . Arthritis Mother   . Heart disease Father   . Diabetes Father   . Stroke Father   . Stroke Other   . Gout Other   . Coronary artery disease Other     SOCIAL HISTORY: Social History   Socioeconomic History  . Marital status: Married    Spouse name: [name]  . Number of children: 5  . Years of education: 8th   . Highest education level: Not on file  Occupational History    Employer: RETIRED  Tobacco Use  . Smoking status: Former Smoker    Packs/day: 0.25    Years: 15.00    Pack years: 3.75    Types: Cigarettes    Start date: 10/19/1970    Quit date: 10/19/1985    Years since quitting: 35.2  . Smokeless tobacco: Current User    Types: Chew  Vaping Use  . Vaping Use: Never used  Substance and Sexual Activity  . Alcohol use: Not Currently    Alcohol/week: 0.0 standard drinks  . Drug use: No  . Sexual activity: Not Currently  Other Topics Concern  . Not on file  Social History Narrative   Patient lives at home with wife [name].    Patient has 5 children.    Patient has a 8th grade.    Patient is retired.    Patient is on  disability.    Patient is right handed.    Patient drinks caffeine every once in a while.    Social Determinants of Health   Financial Resource Strain: Not on file  Food Insecurity: Not on file  Transportation Needs: Not on file  Physical Activity: Not on file  Stress: Not on file  Social Connections: Not on file  Intimate Partner Violence: Not on file     PHYSICAL EXAM  Vitals:   01/21/21 1027  BP: 128/83  Pulse: 68  Weight: 206 lb (93.4 kg)  Height: 5\' 9"  (1.753 m)   Body mass index is 30.42 kg/m.  General: well developed, well nourished, pleasant middle aged 23 male, seated, in no evident distress Head: head normocephalic and atraumatic.   Neck: supple with no carotid or supraclavicular bruits Cardiovascular: regular rate and rhythm, no murmurs Musculoskeletal: no deformity Skin:  no rash/petichiae Vascular:  Normal pulses all extremities   Neurologic Exam Mental Status: Awake and fully alert.   Mild dysarthria.  Oriented to place and time. Recent and remote memory intact. Attention span, concentration and fund of knowledge appropriate. Mood and affect appropriate.  Cranial Nerves: Fundoscopic exam reveals sharp disc margins. Pupils equal, briskly reactive to light. Extraocular movements full without nystagmus. Visual fields full to confrontation. Hearing intact. Facial sensation intact. Left lower facial weakness.  Motor:  LUE: 3-4/5 deltoid and bicep. 1/5 tricep; no grip strength with increased tone throughout.  Limited shoulder ROM with possible subluxation LLE: 4+/5 proximal with foot drop and use of AFO brace Full strength right upper and lower extremity Sensory.: intact to touch , pinprick , position and vibratory sensation.  Coordination: Rapid alternating movements normal in all extremities except limited on left side. Finger-to-nose and heel-to-shin performed accurately on right side Gait and Station: Arises from chair without difficulty. Stance is  normal. Gait demonstrates  hemiplegic gait with use of cane and AFO brace Reflexes: 1+ and symmetric. Toes downgoing.      DIAGNOSTIC DATA (LABS, IMAGING, TESTING) - I reviewed patient records, labs, notes, testing and imaging myself where available.  Lab Results  Component Value Date   WBC 7.8 01/23/2020   HGB 14.8 01/23/2020   HCT 45.5 01/23/2020   MCV 95.6 01/23/2020   PLT 237 01/23/2020      Component Value Date/Time   NA 142 01/23/2020 1327   K 3.8 01/23/2020 1327   CL 108 01/23/2020 1327   CO2 24 01/23/2020 1327   GLUCOSE 92 01/23/2020 1327   BUN 15 01/23/2020 1327   CREATININE 1.14 01/23/2020 1327   CALCIUM 8.8 (L) 01/23/2020 1327   PROT 6.1 (L) 06/11/2019 0738   ALBUMIN 3.2 (L) 06/11/2019 0738   AST 21 06/11/2019 0738   ALT 14 06/11/2019 0738   ALKPHOS 30 (L) 06/11/2019 0738   BILITOT 1.0 06/11/2019 0738   GFRNONAA >60 01/23/2020 1327   GFRAA >60 01/23/2020 1327      ASSESSMENT AND PLAN 65 y.o. year old male has a past medical history of Hyperlipidemia; Hypertension; Stroke; Diabetes mellitus; Coronary artery disease; Anxiety; and Prostate cancer (05-2015). And seizure disorder in excellent control on Keppra.  Residual stroke deficits of left spastic hemiparesis and possible left shoulder subluxation   Seizure disorder -No additional seizure events -Continue Keppra 250 mg twice daily for seizure prophylaxis -refill provided -Discussed avoidance of seizure provoking triggers  Hx of stroke -Residual spastic hemiparesis -use of tizanidine 2mg  1-2 tabs at night as needed for spasticity -Continue Plavix and atorvastatin for secondary stroke prevention -Discussed secondary stroke prevention measures and importance of close PCP follow-up for aggressive stroke risk factor management -Maintain strict control of hypertension with blood pressure goal below 130/90, lipids with LDL cholesterol goal below 70 mg percent and diabetes with hemoglobin A1c goal below 7%.   -Reports recent lab work by PCP which was satisfactory  B/l carotid artery stenosis -2020 CUS:  left ICA 1 to 39% stenosis and right ICA occlusion -repeat carotid ultrasound    Follow-up in 1 year or call earlier if needed  CC:  [place] provider: Dr. Leary Roca, [facility]    I spent 35  minutes of face-to-face and non-face-to-face time with patient.  This included previsit chart review, lab review, study review, order entry, electronic health record documentation, patient education and discussion regarding seizure disorder, AED compliance and seizure triggers, history of prior stroke with residual deficits and importance of managing stroke risk factors and answered all other questions to patient satisfaction   Frann Rider, [facility]  [facility] [address] [place],  [address]  Phone [phone] Fax [phone] Note: This document was prepared with digital dictation and possible smart phrase technology. Any transcriptional errors that result from this process are unintentional.

## 2021-01-21 NOTE — Patient Instructions (Signed)
Zanalfex 1-2 tabs at night as needed for spasticity   Continue Keppra 250 mg twice daily for seizure prophylaxis  Continue clopidogrel 75 mg daily  and atorvastatin for secondary stroke prevention  We will repeat carotid ultrasound for monitoring of carotid stenosis   Continue to follow up with PCP regarding cholesterol, blood pressure and diabetes management  Maintain strict control of hypertension with blood pressure goal below 130/90, diabetes with hemoglobin A1c goal below 7 % and cholesterol with LDL cholesterol (bad cholesterol) goal below 70 mg/dL.       Followup in the future with me in 1 year or call earlier if needed       Thank you for coming to see [place] at [facility]. I hope we have been able to provide you high quality care today.  You may receive a patient satisfaction survey over the next few weeks. We would appreciate your feedback and comments so that we may continue to improve ourselves and the health of our patients.

## 2021-01-31 NOTE — Progress Notes (Signed)
I agree with the above plan 

## 2021-02-04 ENCOUNTER — Ambulatory Visit: Payer: Medicare PPO | Admitting: Cardiology

## 2021-02-04 ENCOUNTER — Other Ambulatory Visit: Payer: Self-pay

## 2021-02-04 ENCOUNTER — Ambulatory Visit (HOSPITAL_COMMUNITY)
Admission: RE | Admit: 2021-02-04 | Discharge: 2021-02-04 | Disposition: A | Discharge status: Home / Self Care | Payer: Medicare Other | Source: Ambulatory Visit | Attending: Adult Health | Admitting: Adult Health

## 2021-02-04 DIAGNOSIS — I6521 Occlusion and stenosis of right carotid artery: Secondary | ICD-10-CM | POA: Diagnosis present

## 2021-02-04 DIAGNOSIS — I6522 Occlusion and stenosis of left carotid artery: Secondary | ICD-10-CM

## 2021-02-04 NOTE — Progress Notes (Signed)
Carotid Completed    Please see CV Proc for preliminary results.   Vonzell Schlatter, RVT

## 2021-02-12 IMAGING — RF RETROGRADE PYELOGRAM
1 series · 6 of 6 positions shown · non-contrast
Comparison: CT from the same day

CLINICAL DATA: Renal calculus

EXAM:
RETROGRADE PYELOGRAM

[Series 1: run · 6 of 6 slices shown]
[im 1/6]
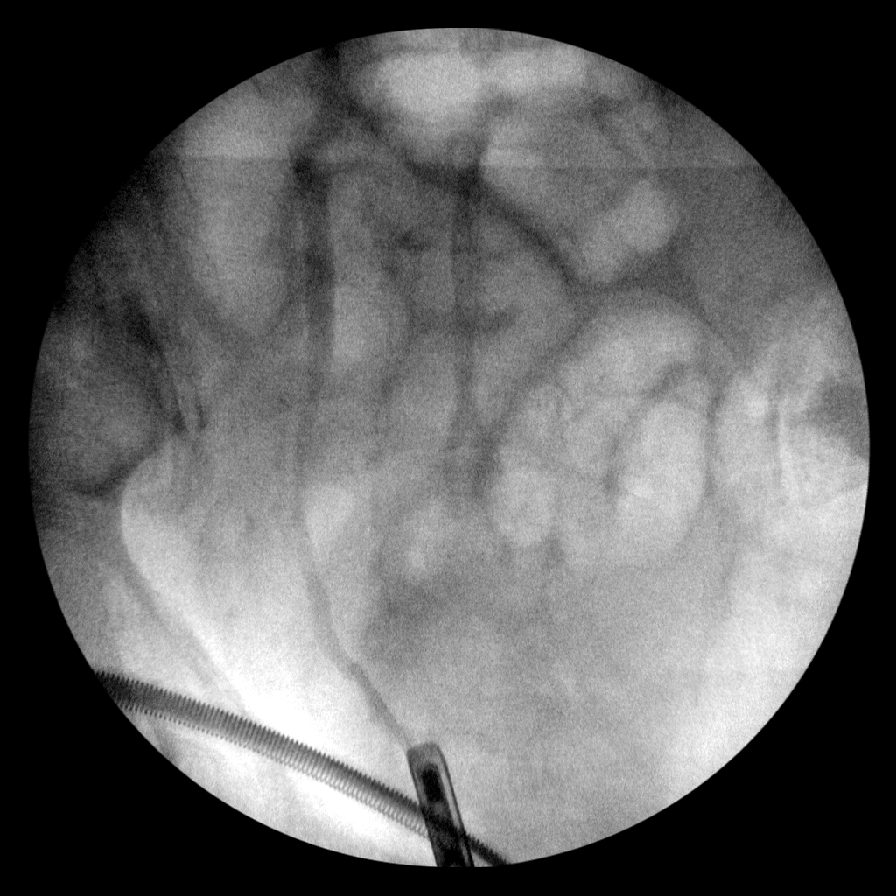
[im 2/6]
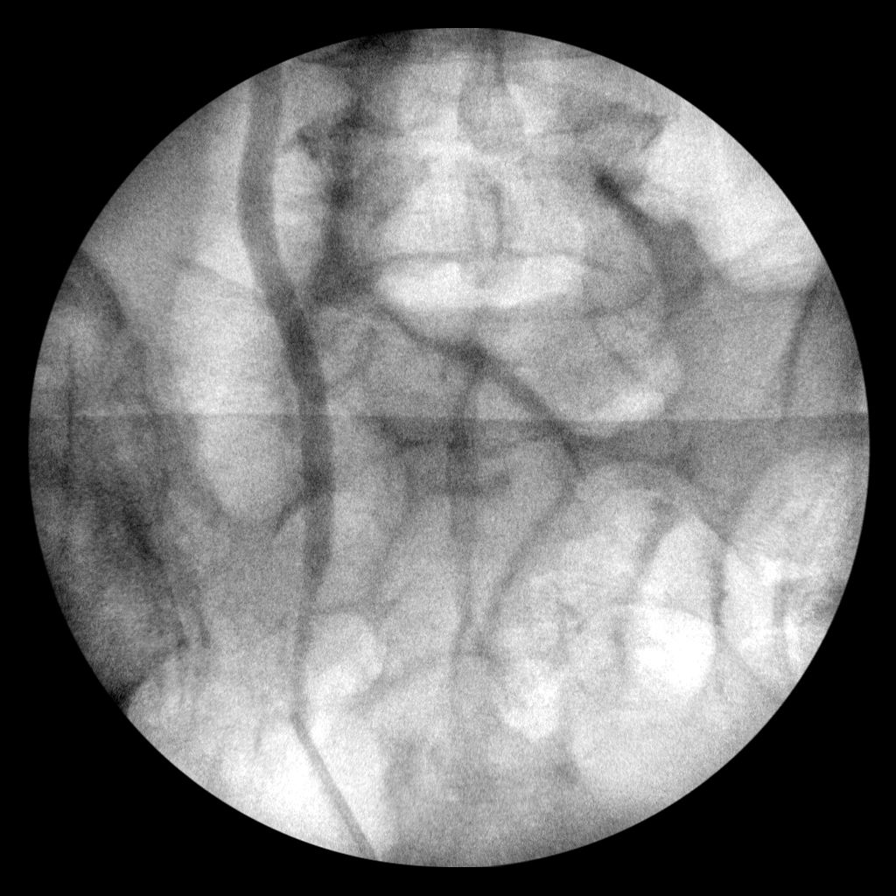
[im 3/6]
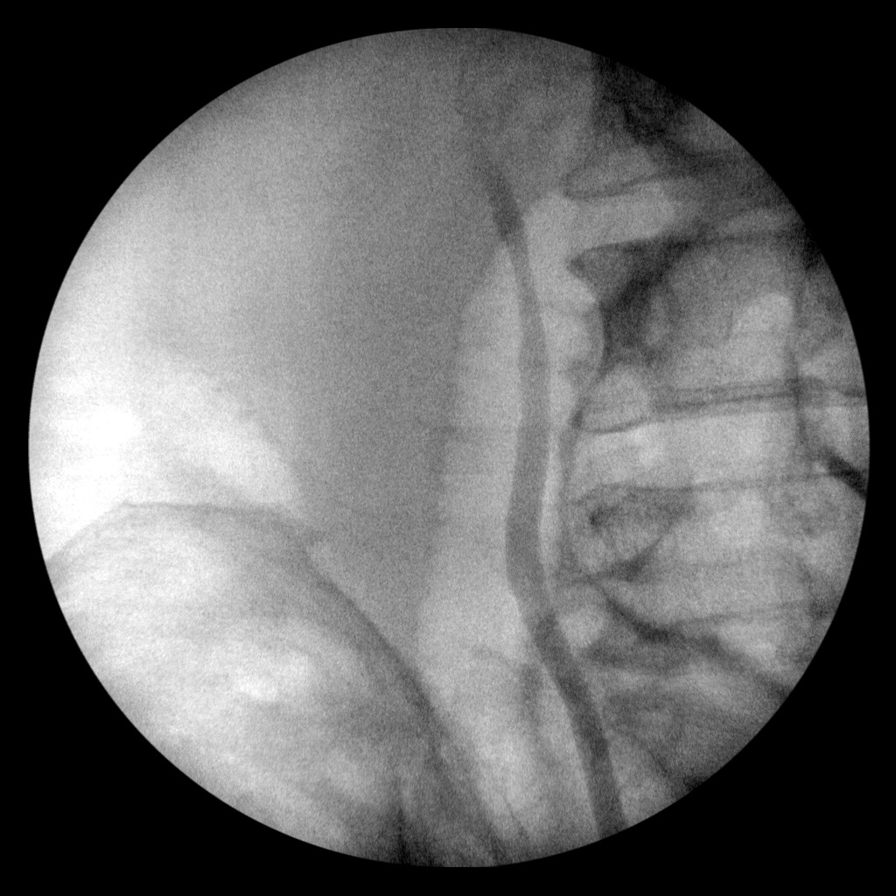
[im 4/6]
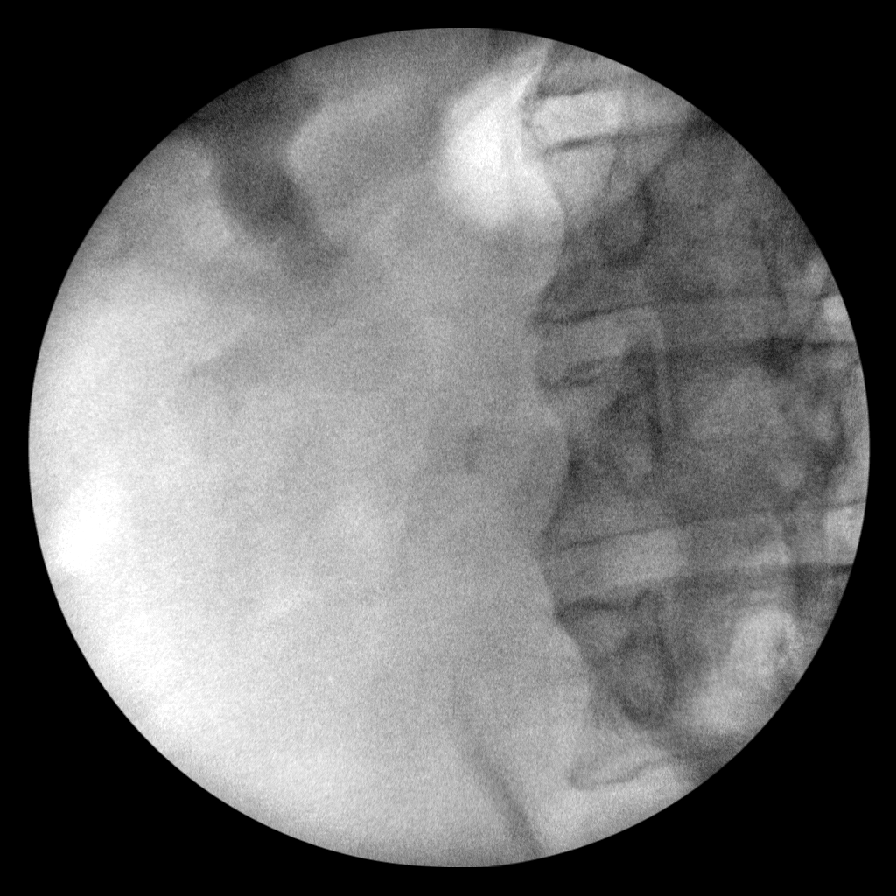
[im 5/6]
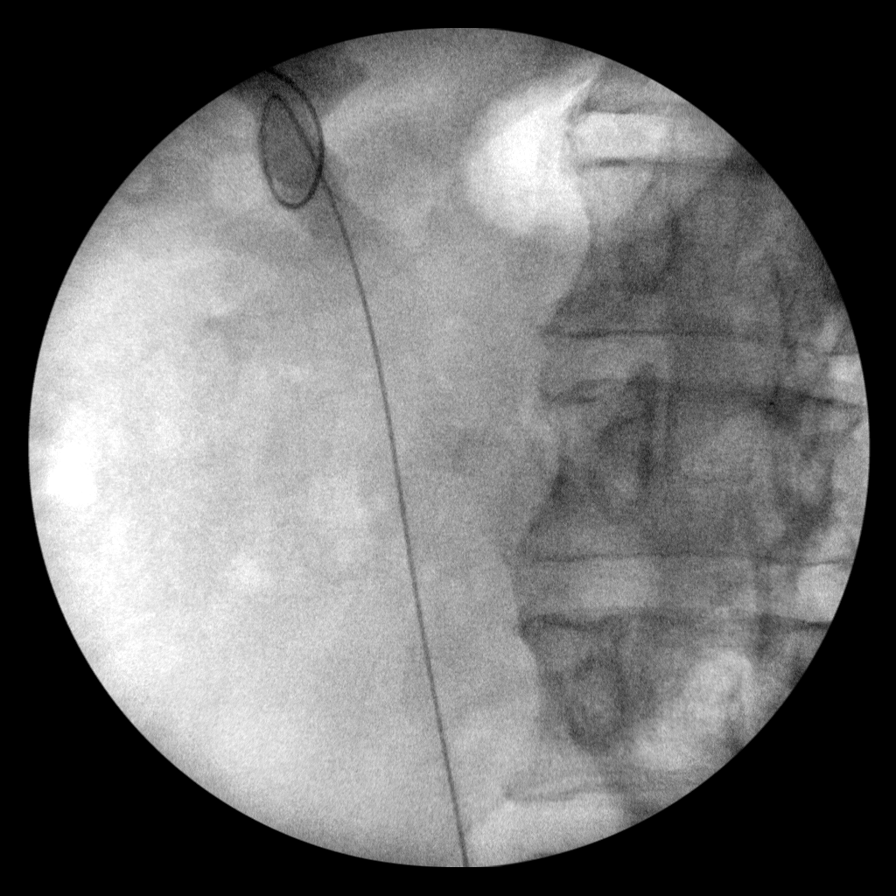
[im 6/6]
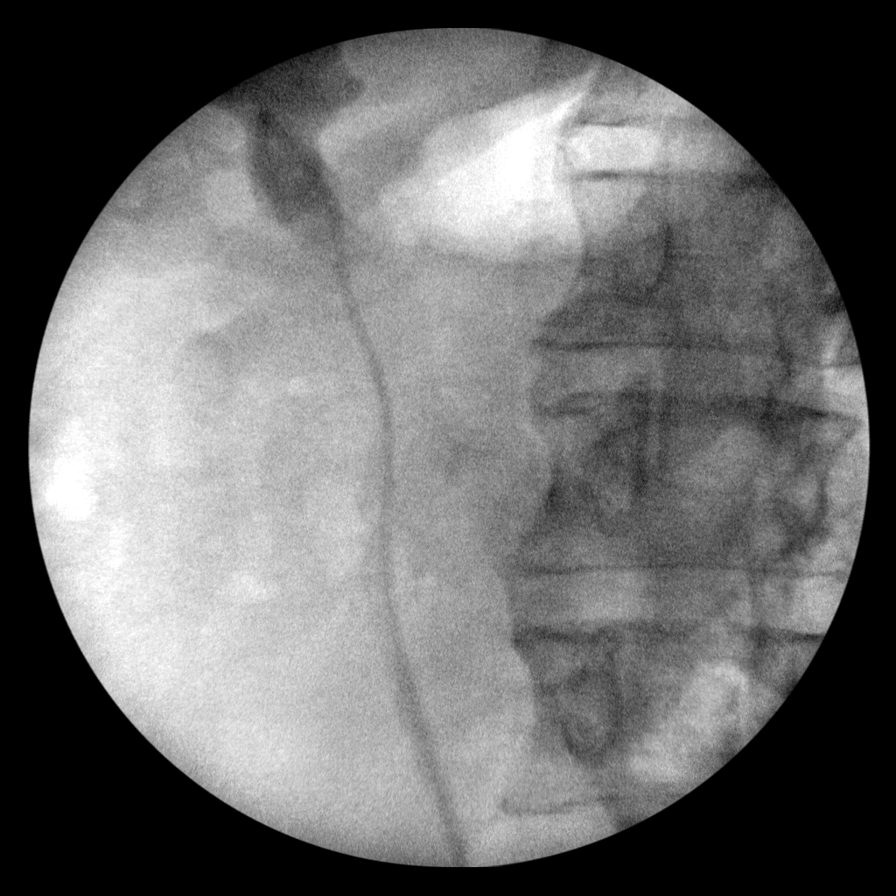

[6 of 6 positions shown; findings below may reference images not displayed]

FINDINGS: A series of fluoroscopic spot images documents unilateral retrograde
pyelography with placement of ureteral stent. Incomplete
opacification of the proximal ureter. No definite urothelial lesion
identified. Laterality not indicated on images.
IMPRESSION: Retrograde pyelogram and ureteral stent placement.

## 2021-02-12 IMAGING — CR PORTABLE CHEST - 1 VIEW
1 series · 2 of 2 positions shown · non-contrast
Comparison: None.

CLINICAL DATA: Fever, body aches, altered mental status

EXAM:
PORTABLE CHEST 1 VIEW

[Series 4: ap · 0.17mm/px · 2 of 2 slices shown]
[im 1/2]
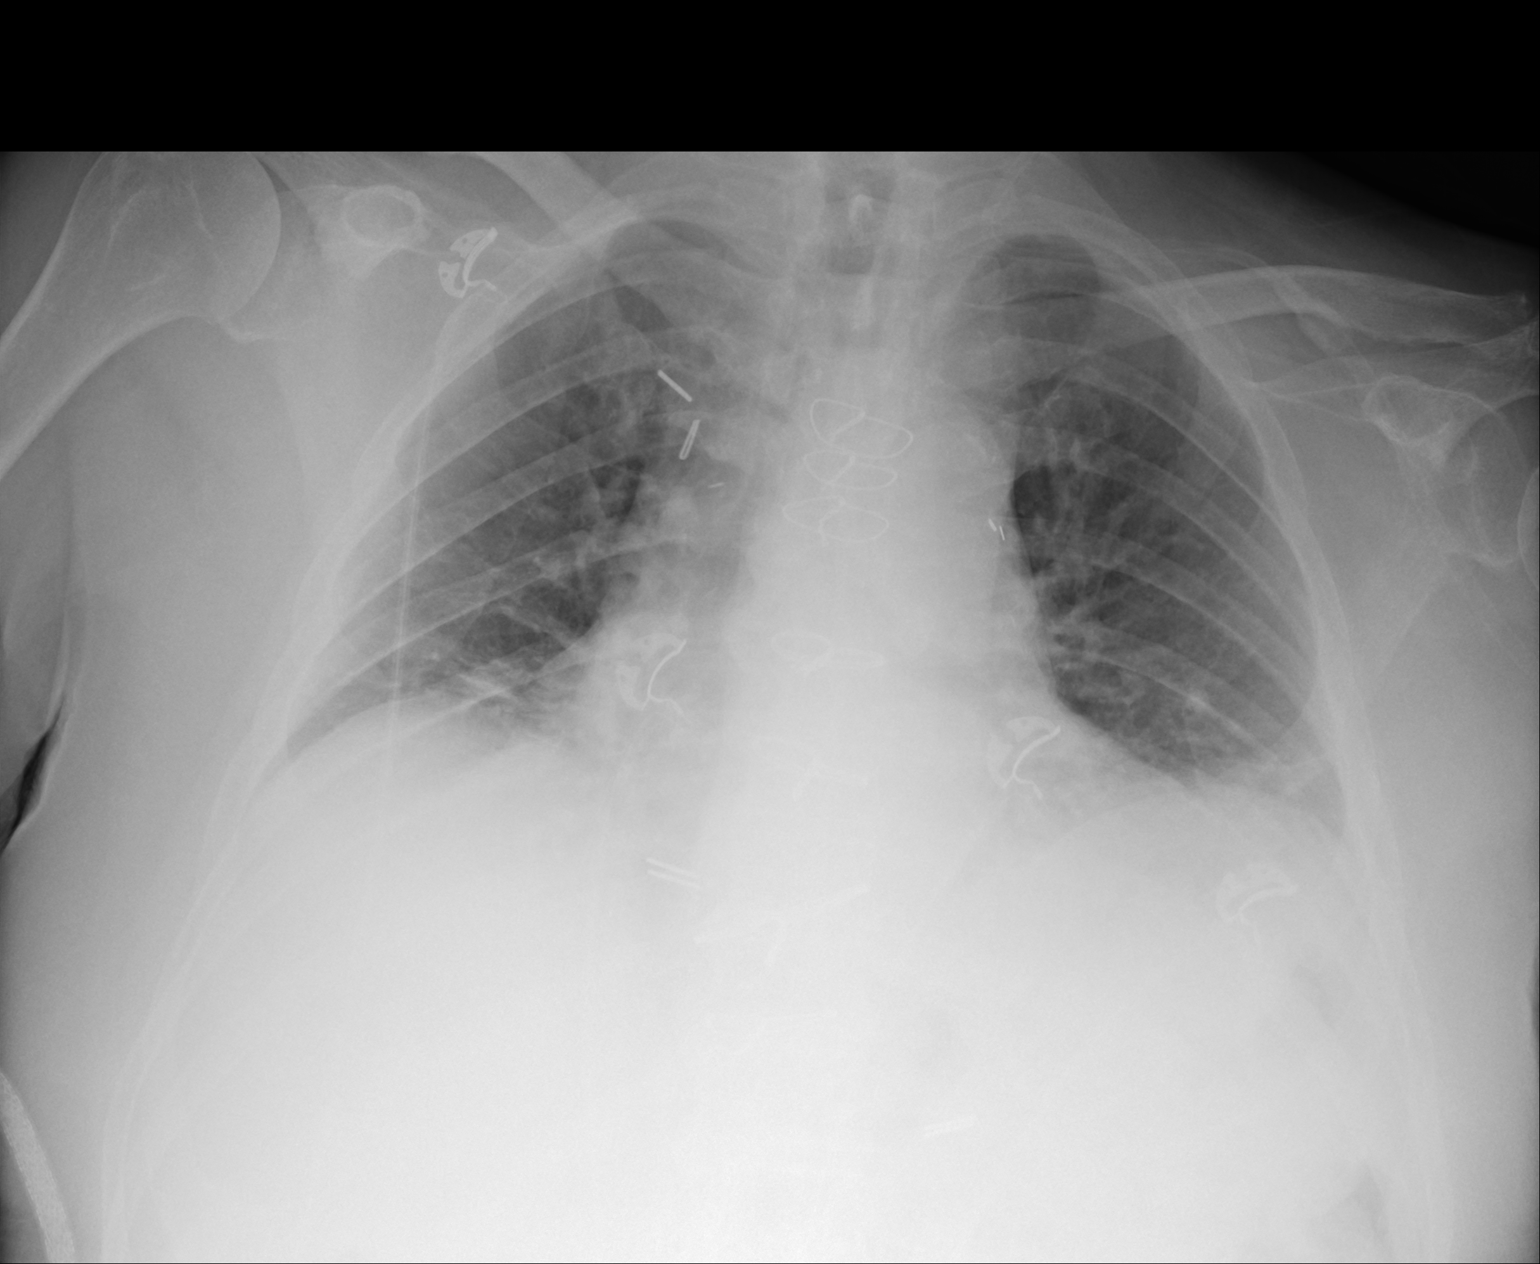
[im 2/2]
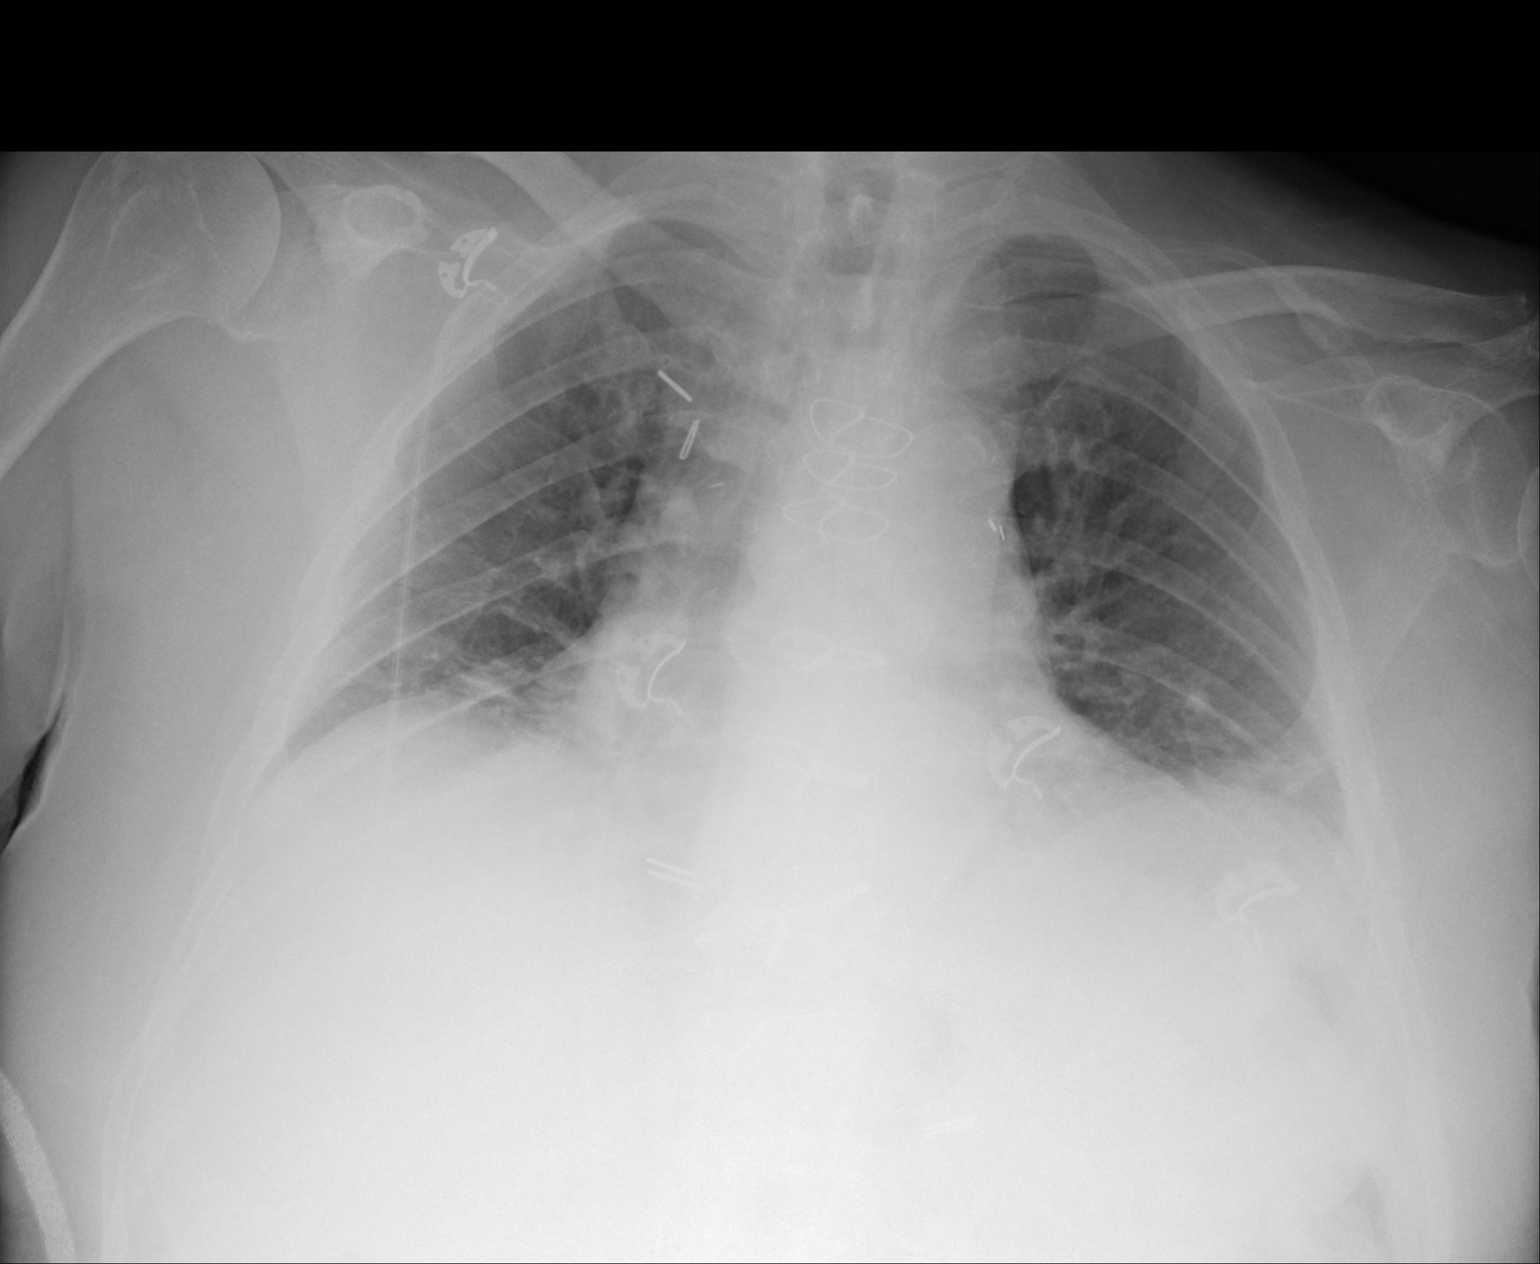

[2 of 2 positions shown; findings below may reference images not displayed]

FINDINGS: Low lung volumes with bibasilar atelectasis. No focal consolidation.
No pleural effusion or pneumothorax.

The heart is normal in size. Postsurgical changes related to prior
CABG.

Median sternotomy.
IMPRESSION: Low lung volumes with bibasilar atelectasis.

## 2021-02-12 IMAGING — CT CT RENAL STONE PROTOCOL
2 of 4 series · 16 of 46 positions shown, 18 images · non-contrast
Comparison: Radiograph 06/10/2019, CT 05/10/2019

CLINICAL DATA: Stent placed recently, altered mental status

EXAM:
CT ABDOMEN AND PELVIS WITHOUT CONTRAST
TECHNIQUE: Multidetector CT imaging of the abdomen and pelvis was performed
following the standard protocol without IV contrast.

[Series 2: axial st · axial · 0.87mm/px · z∈[+733,+1233]mm · 13 of 112 slices shown, 15 images]
[im 6/112  soft-tissue]
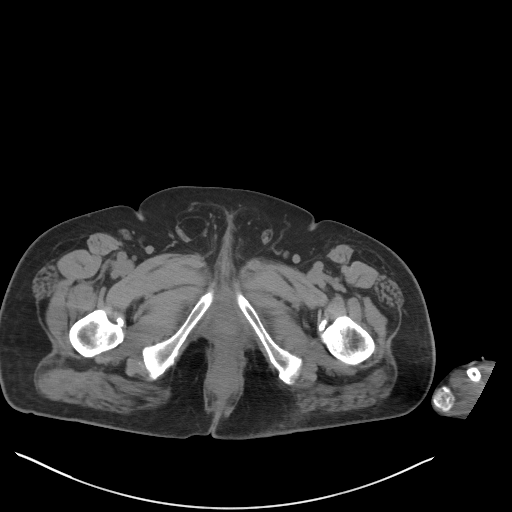
[im 6/112  bone]
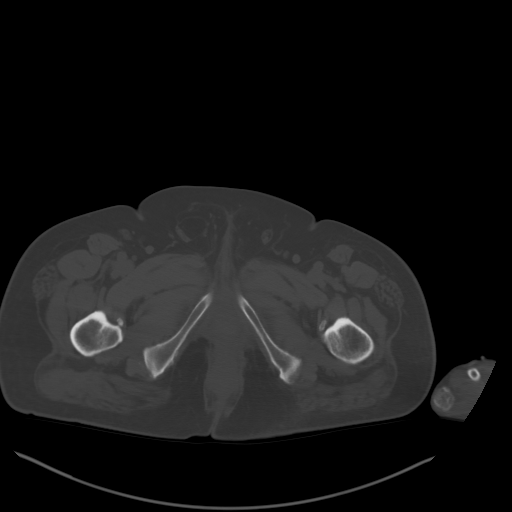
[im 18/112  soft-tissue]
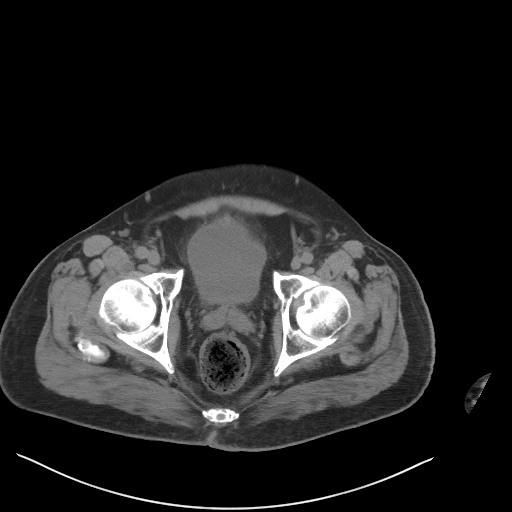
[im 24/112  soft-tissue]
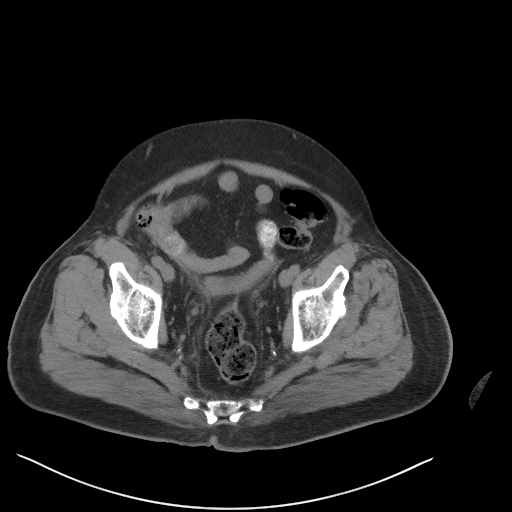
[im 30/112  soft-tissue]
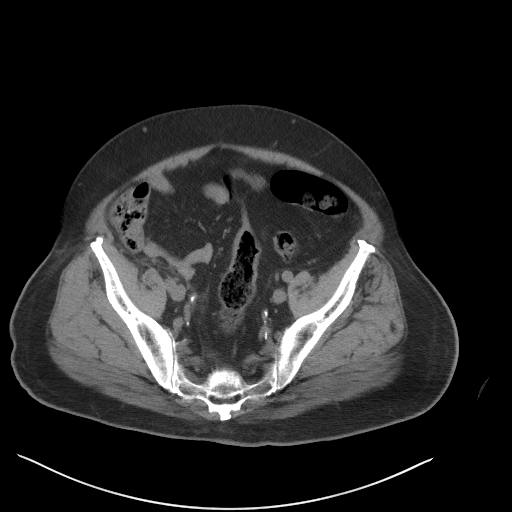
[im 41/112  soft-tissue]
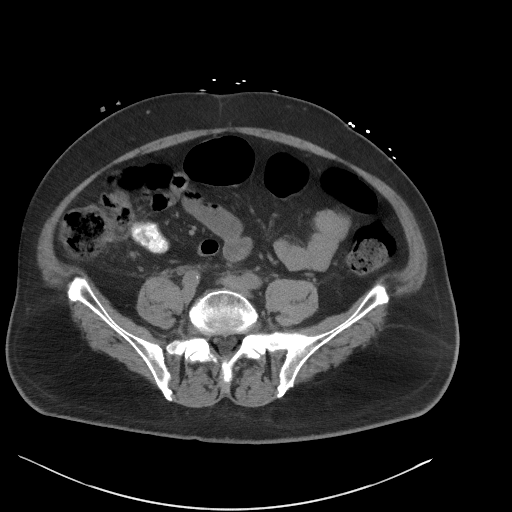
[im 47/112  soft-tissue]
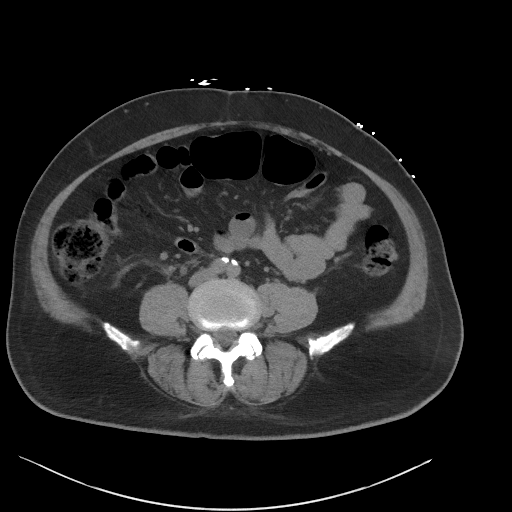
[im 59/112  soft-tissue]
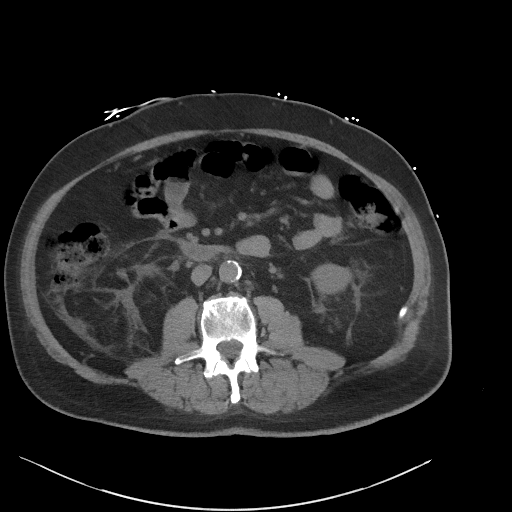
[im 65/112  soft-tissue]
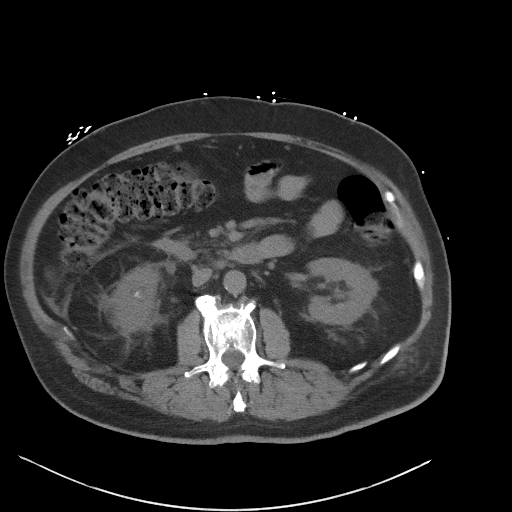
[im 71/112  soft-tissue]
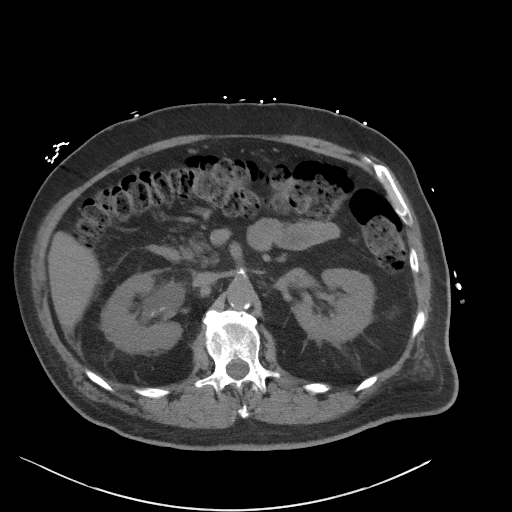
[im 71/112  bone]
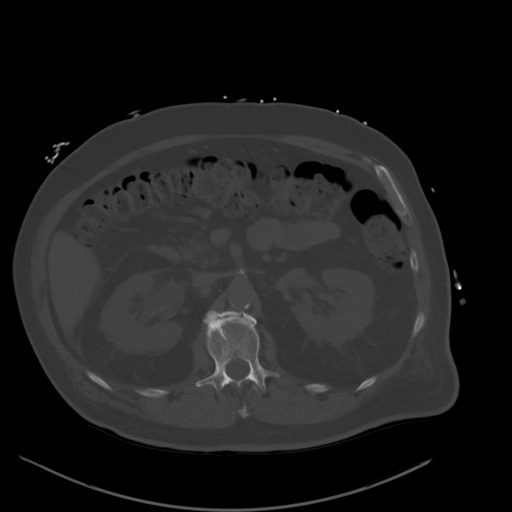
[im 82/112  soft-tissue]
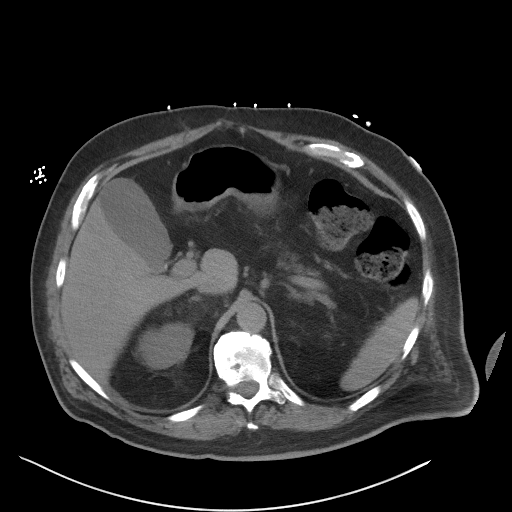
[im 88/112  soft-tissue]
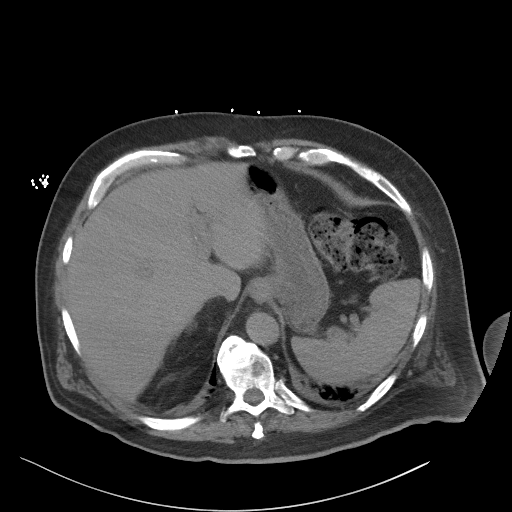
[im 94/112  soft-tissue]
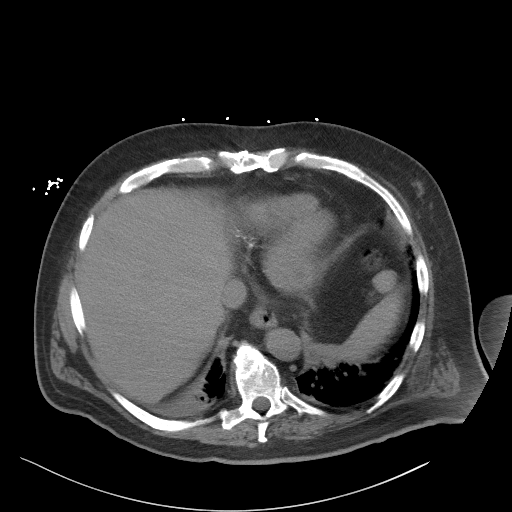
[im 106/112  soft-tissue]
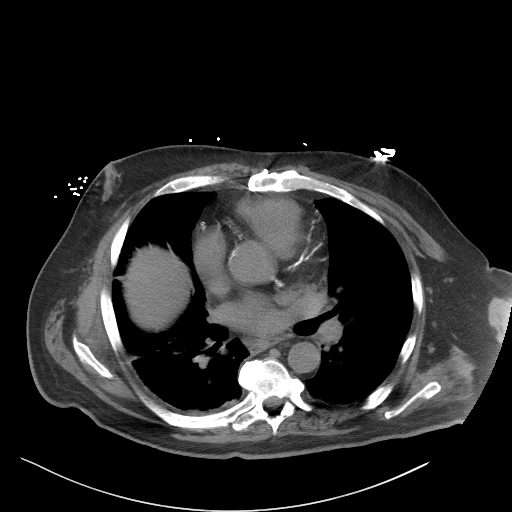

[Series 5: coronal st · coronal · 0.94mm/px · 3 of 101 slices shown]
[im 34/101  soft-tissue]
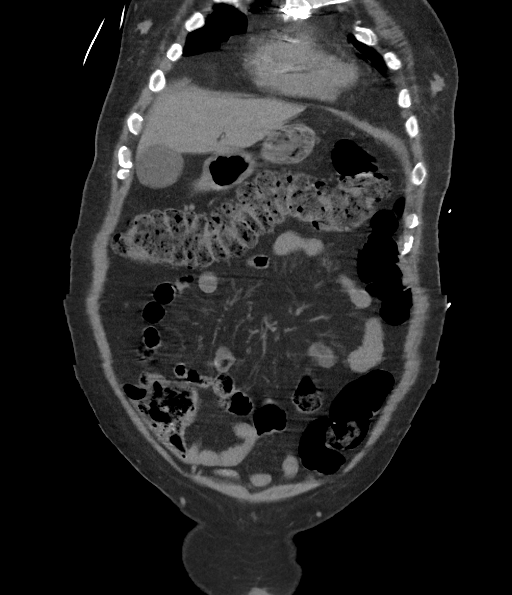
[im 45/101  soft-tissue]
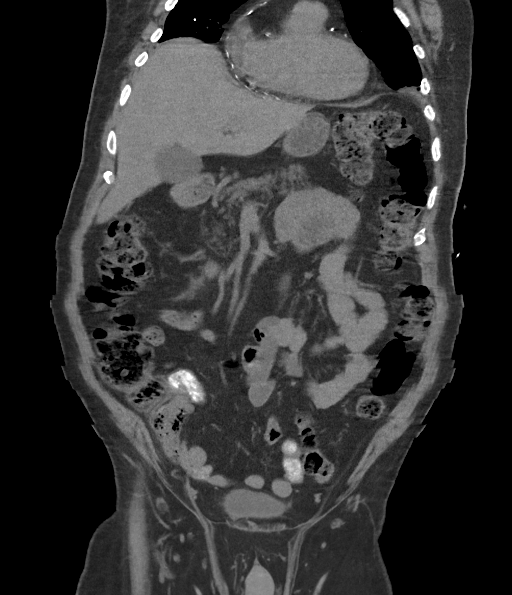
[im 56/101  soft-tissue]
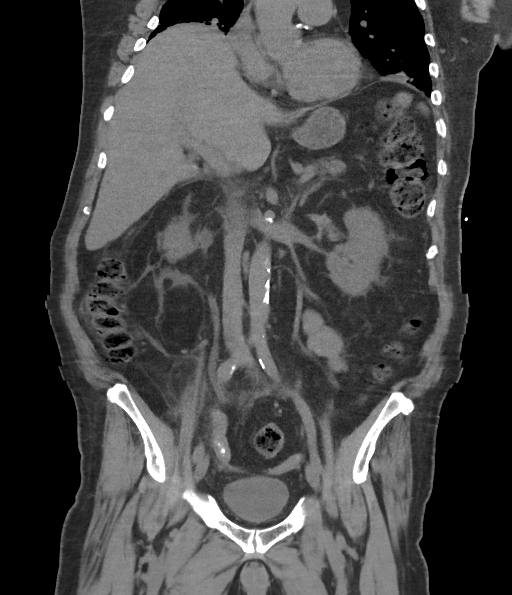

[16 of 46 positions shown; findings below may reference images not displayed]

FINDINGS: Lower chest: Lung bases demonstrate no pleural effusion. Partial
atelectasis at the bilateral lung bases. Coronary vascular
calcification. Ectatic aortic root measuring up to 3.8 cm. Small
hiatal hernia.

Hepatobiliary: Small calcified gallstones. No focal hepatic
abnormality. No biliary dilatation

Pancreas: Unremarkable. No pancreatic ductal dilatation or
surrounding inflammatory changes.

Spleen: Normal in size without focal abnormality.

Adrenals/Urinary Tract: Adrenal glands are normal. Moderate
bilateral perinephric fat stranding, increased compared to prior.
Numerous stone fragments within the right kidney measuring up to 6
mm, previously noted dominant right renal pelvis stone no longer
identified. Mild to moderate right hydronephrosis and hydroureter,
secondary to multiple small stones in the distal right ureter ovary
2 cm segment of ureter. Stones measure up to 3 mm in diameter. These
are visualized at the mid sacral level. Urinary bladder unremarkable

Stomach/Bowel: Stomach is within normal limits. Appendix appears
normal. No evidence of bowel wall thickening, distention, or
inflammatory changes.

Vascular/Lymphatic: Nonaneurysmal aorta. Moderate aortic
atherosclerosis. No significantly enlarged lymph nodes

Reproductive: Clips at the prostate.  Prostate calcifications.

Other: Negative for free air or free fluid

Musculoskeletal: No acute or suspicious osseous abnormality.
Degenerative changes.
IMPRESSION: 1. Mild to moderate right hydronephrosis and hydroureter, secondary
to what appears to be multiple adjacent small stones in the distal
right ureter, spanning a craniocaudad length of ureter measuring 2
cm with stones measuring up to 3 mm in diameter. These are present
at the level of mid sacrum.
2. Multiple stone fragments now visible in the right kidney as
opposed to the dominant stone in the pelvis on the prior exam.
3. Increased bilateral perinephric fat stranding
4. Gallstones

## 2021-03-18 ENCOUNTER — Encounter: Payer: Self-pay | Admitting: Cardiology

## 2021-05-28 ENCOUNTER — Encounter: Payer: Self-pay | Admitting: *Deleted

## 2021-05-28 ENCOUNTER — Encounter: Payer: Self-pay | Admitting: Cardiology

## 2021-05-28 ENCOUNTER — Ambulatory Visit (INDEPENDENT_AMBULATORY_CARE_PROVIDER_SITE_OTHER): Payer: Medicare Other | Admitting: Cardiology

## 2021-05-28 VITALS — BP 131/88 | HR 66 | Ht 69.0 in | Wt 210.4 lb

## 2021-05-28 DIAGNOSIS — I10 Essential (primary) hypertension: Secondary | ICD-10-CM | POA: Diagnosis not present

## 2021-05-28 DIAGNOSIS — I6521 Occlusion and stenosis of right carotid artery: Secondary | ICD-10-CM | POA: Diagnosis not present

## 2021-05-28 DIAGNOSIS — I25119 Atherosclerotic heart disease of native coronary artery with unspecified angina pectoris: Secondary | ICD-10-CM | POA: Diagnosis not present

## 2021-05-28 DIAGNOSIS — E782 Mixed hyperlipidemia: Secondary | ICD-10-CM

## 2021-05-28 MED ORDER — NITROGLYCERIN 0.4 MG SL SUBL: 0.4000 mg | SUBLINGUAL_TABLET | SUBLINGUAL | 3 refills | Status: DC | PRN

## 2021-05-28 NOTE — Progress Notes (Signed)
Cardiology Office Note  Date: 05/28/2021   ID: Arthur Manges., DOB Mar 26, 1956, MRN [id number]  PCP:  [facility], [place]  Cardiologist:  Rozann Lesches, MD Electrophysiologist:  None   Chief Complaint  Patient presents with   Cardiac follow-up     History of Present Illness: Arthur Cripps. is a 65 y.o. male former patient of Dr. Bronson Ing now presenting to establish follow-up with me.  I reviewed his records and updated the chart.  He was last seen in March 2021.  He is here today with family member for a follow-up visit.  He does not report any angina symptoms or nitroglycerin use at this time on medical therapy.  He has left-sided deficits related to previous stroke but remains functional with ADLs.  Reports NYHA class II dyspnea, no palpitations or syncope.  He follows with PCP in [place], we are requesting his most recent lipid panel.  He reports compliance with his medications, no obvious intolerances.  He does need refill for a fresh bottle of nitroglycerin.  I reviewed his ECG which shows sinus rhythm with possible old inferior infarct pattern.   Past Medical History:  Diagnosis Date   Anxiety    pt denies   Carotid artery disease ([place])    Coronary artery disease    Multivessel disease status post CABG in 2000 at [facility]   Depression    Essential hypertension    Hyperlipidemia    Nephrolithiasis    Prostate cancer ([place]) 2016   Status post XRT   Stroke [facility]) 2011   Right MCA infarct   Type 2 diabetes mellitus ([place])     Past Surgical History:  Procedure Laterality Date   CORONARY ARTERY BYPASS GRAFT     4 graft   CYSTOSCOPY WITH STENT PLACEMENT Right 06/11/2019   Procedure: CYSTOSCOPY WITH STENT PLACEMENT;  Surgeon: Lucas Mallow, MD;  Location: [facility];  Service: Urology;  Laterality: Right;   CYSTOSCOPY/URETEROSCOPY/HOLMIUM LASER/STENT PLACEMENT Right 06/06/2019   Procedure: CYSTOSCOPY/URETEROSCOPY/HOLMIUM LASER/STENT  PLACEMENT;  Surgeon: Raynelle Bring, MD;  Location: [facility];  Service: Urology;  Laterality: Right;   CYSTOSCOPY/URETEROSCOPY/HOLMIUM LASER/STENT PLACEMENT Right 07/15/2019   Procedure: CYSTOSCOPY/URETEROSCOPY/STENT PLACEMENT;  Surgeon: Raynelle Bring, MD;  Location: [facility];  Service: Urology;  Laterality: Right;   CYSTOSCOPY/URETEROSCOPY/HOLMIUM LASER/STENT PLACEMENT Bilateral 01/30/2020   Procedure: CYSTOSCOPY/RETROGRADE/URETEROSCOPY /RIGHT STENT PLACEMENT;  Surgeon: Raynelle Bring, MD;  Location: [facility];  Service: Urology;  Laterality: Bilateral;   PROSTATE BIOPSY     right hand surgery      Current Outpatient Medications  Medication Sig Dispense Refill   acetaminophen (TYLENOL) 500 MG tablet Take 1,000 mg by mouth every 6 (six) hours as needed (pain).      amLODipine (NORVASC) 5 MG tablet Take 5 mg by mouth daily.     atorvastatin (LIPITOR) 40 MG tablet Take 40 mg by mouth at bedtime.      cholecalciferol (VITAMIN D) 25 MCG (1000 UNIT) tablet Take 1,000 Units by mouth daily.     clopidogrel (PLAVIX) 75 MG tablet Take 75 mg by mouth daily.     fluticasone (FLONASE) 50 MCG/ACT nasal spray Place 1 spray into both nostrils daily.     levETIRAcetam (KEPPRA) 250 MG tablet TAKE 1 TABLET BY MOUTH TWICE DAILY 180 tablet 3   losartan (COZAAR) 25 MG tablet Take 25 mg by mouth daily.     metFORMIN (GLUCOPHAGE-XR) 500 MG 24 hr tablet Take 1,000 mg by mouth 2 (two)  times a day.      tiZANidine (ZANAFLEX) 2 MG tablet 2mg  - 4mg  ( 1 or 2 tabs) at night as needed for spasticity 60 tablet 11   vitamin B-12 (CYANOCOBALAMIN) 1000 MCG tablet Take 1,000 mcg by mouth daily.     ZETIA 10 MG tablet Take 10 mg by mouth daily.     nitroGLYCERIN (NITROSTAT) 0.4 MG SL tablet Place 1 tablet (0.4 mg total) under the tongue every 5 (five) minutes x 3 doses as needed for chest pain (if no relief after 2nd dose, proceed to the ED for an evaluation or call 911). 25 tablet 3   No current facility-administered medications for  this visit.   Allergies:  5-alpha reductase inhibitors, Lyrica [pregabalin], Motrin [ibuprofen], and Tramadol   ROS: No palpitations or syncope.  Physical Exam: VS:  BP 131/88   Pulse 66   Ht 5\' 9"  (1.753 m)   Wt 210 lb 6.4 oz (95.4 kg)   SpO2 95%   BMI 31.07 kg/m , BMI Body mass index is 31.07 kg/m.  Wt Readings from Last 3 Encounters:  05/28/21 210 lb 6.4 oz (95.4 kg)  01/21/21 206 lb (93.4 kg)  02/05/20 199 lb (90.3 kg)    General: Patient appears comfortable at rest. HEENT: Conjunctiva and lids normal, wearing a mask. Neck: Supple, no elevated JVP or carotid bruits, no thyromegaly. Lungs: Clear to auscultation, nonlabored breathing at rest. Cardiac: Regular rate and rhythm, no S3 or significant systolic murmur, no pericardial rub. Abdomen: Soft, nontender, bowel sounds present. Extremities: No pitting edema.  ECG:  An ECG dated 06/03/2019 was personally reviewed today and demonstrated:  Sinus rhythm.  Recent Labwork:  March 2021: Potassium 3.8, BUN 15, creatinine 1.14, hemoglobin A1c 5.5%, hemoglobin 14.8, platelets 237  Other Studies Reviewed Today:  Echocardiogram 09/30/2015: - Left ventricle: The cavity size was normal. Wall thickness was    increased in a pattern of mild LVH. Systolic function was normal.    The estimated ejection fraction was in the range of 55% to 60%.    Images were inadequate for LV wall motion assessment. Doppler    parameters are consistent with abnormal left ventricular    relaxation (grade 1 diastolic dysfunction).  - Aortic valve: Mildly calcified annulus. Trileaflet.  - Aorta: Mild aortic root dilatation. Aortic root dimension: 41 mm    (ED).  - Mitral valve: Calcified annulus. Normal thickness leaflets .  - Right ventricle: Systolic function was mildly reduced.   Lexiscan Myoview 10/02/2015: There was no ST segment deviation noted during stress. Defect 1: There is a medium defect of mild severity present in the basal inferior,  basal inferolateral, mid inferior and mid inferolateral location. Findings consistent with prior myocardial infarction. Nuclear stress EF: 65%. This is a low risk study.  Carotid Dopplers 02/04/2021: Summary:  Right Carotid Known occlusion of the right ICA.   Left Carotid: Velocities in the left ICA are consistent with a 1-39%  stenosis.   Vertebrals:  Bilateral vertebral arteries demonstrate antegrade flow.  Subclavians: Normal flow hemodynamics were seen in bilateral subclavian               arteries.   Lower extremity arterial Dopplers 12/07/2020: IMPRESSION: No definitive Doppler evidence of significant lower extremity arterial occlusive disease.  Assessment and Plan:  1.  Multivessel CAD status post CABG in 2000.  He does not report any active angina symptoms, ECG reviewed.  Plan to continue observation in the absence of progressive angina symptoms.  Continue Plavix, Cozaar, Lipitor, Norvasc, and Zetia.  Refill provided for fresh bottle of nitroglycerin.  2.  Mixed hyperlipidemia, on Lipitor and Zetia.  Requesting lipid panel from PCP.  3.  Carotid artery disease with known occlusion of the RICA and otherwise mild LICA stenosis.  He has a previous history of right MCA stroke.  4.  Essential hypertension, blood pressure control is adequate today.  Medication Adjustments/Labs and Tests Ordered: Current medicines are reviewed at length with the patient today.  Concerns regarding medicines are outlined above.   Tests Ordered: Orders Placed This Encounter  Procedures   EKG 12-Lead     Medication Changes: Meds ordered this encounter  Medications   nitroGLYCERIN (NITROSTAT) 0.4 MG SL tablet    Sig: Place 1 tablet (0.4 mg total) under the tongue every 5 (five) minutes x 3 doses as needed for chest pain (if no relief after 2nd dose, proceed to the ED for an evaluation or call 911).    Dispense:  25 tablet    Refill:  3     Disposition:  Follow up  1 year, sooner if  needed.  Signed, Arthur Sark, MD, [facility] 05/28/2021 11:31 AM    [facility] [place], [place], [place] [address] Phone: [phone]; Fax: [phone]

## 2021-05-28 NOTE — Patient Instructions (Addendum)

## 2021-06-03 ENCOUNTER — Emergency Department (HOSPITAL_COMMUNITY)
Admission: EM | Admit: 2021-06-03 | Discharge: 2021-06-03 | Disposition: A | Discharge status: Home / Self Care | Payer: Medicare Other | Attending: Emergency Medicine | Admitting: Emergency Medicine

## 2021-06-03 ENCOUNTER — Other Ambulatory Visit: Payer: Self-pay

## 2021-06-03 ENCOUNTER — Encounter (HOSPITAL_COMMUNITY): Payer: Self-pay | Admitting: Emergency Medicine

## 2021-06-03 ENCOUNTER — Emergency Department (HOSPITAL_COMMUNITY): Payer: Medicare Other

## 2021-06-03 DIAGNOSIS — Z87891 Personal history of nicotine dependence: Secondary | ICD-10-CM | POA: Diagnosis not present

## 2021-06-03 DIAGNOSIS — W19XXXA Unspecified fall, initial encounter: Secondary | ICD-10-CM

## 2021-06-03 DIAGNOSIS — S46911A Strain of unspecified muscle, fascia and tendon at shoulder and upper arm level, right arm, initial encounter: Secondary | ICD-10-CM

## 2021-06-03 DIAGNOSIS — I10 Essential (primary) hypertension: Secondary | ICD-10-CM | POA: Diagnosis not present

## 2021-06-03 DIAGNOSIS — Z79899 Other long term (current) drug therapy: Secondary | ICD-10-CM | POA: Diagnosis not present

## 2021-06-03 DIAGNOSIS — S0990XA Unspecified injury of head, initial encounter: Secondary | ICD-10-CM | POA: Diagnosis not present

## 2021-06-03 DIAGNOSIS — I2510 Atherosclerotic heart disease of native coronary artery without angina pectoris: Secondary | ICD-10-CM | POA: Insufficient documentation

## 2021-06-03 DIAGNOSIS — Z8546 Personal history of malignant neoplasm of prostate: Secondary | ICD-10-CM | POA: Insufficient documentation

## 2021-06-03 DIAGNOSIS — S4991XA Unspecified injury of right shoulder and upper arm, initial encounter: Secondary | ICD-10-CM | POA: Diagnosis present

## 2021-06-03 DIAGNOSIS — W1830XA Fall on same level, unspecified, initial encounter: Secondary | ICD-10-CM | POA: Insufficient documentation

## 2021-06-03 DIAGNOSIS — S39012A Strain of muscle, fascia and tendon of lower back, initial encounter: Secondary | ICD-10-CM

## 2021-06-03 DIAGNOSIS — Y92002 Bathroom of unspecified non-institutional (private) residence single-family (private) house as the place of occurrence of the external cause: Secondary | ICD-10-CM | POA: Insufficient documentation

## 2021-06-03 DIAGNOSIS — Z7984 Long term (current) use of oral hypoglycemic drugs: Secondary | ICD-10-CM | POA: Insufficient documentation

## 2021-06-03 DIAGNOSIS — Z7902 Long term (current) use of antithrombotics/antiplatelets: Secondary | ICD-10-CM | POA: Insufficient documentation

## 2021-06-03 DIAGNOSIS — E119 Type 2 diabetes mellitus without complications: Secondary | ICD-10-CM | POA: Insufficient documentation

## 2021-06-03 MED ORDER — ACETAMINOPHEN 500 MG PO TABS
1000.0000 mg | ORAL_TABLET | Freq: Once | ORAL | Status: AC
  Administered 2021-06-03: 1000 mg via ORAL
  Filled 2021-06-03: qty 2

## 2021-06-03 NOTE — ED Triage Notes (Signed)
Pt fell in his bathroom this morning. Pt did not LOC or hit his head. He states he must have fell over his own feet.

## 2021-06-03 NOTE — ED Notes (Signed)
Pt ambulated to bathroom independently with cane.

## 2021-06-03 NOTE — Discharge Instructions (Addendum)
Your CT scan and x-rays today did not show evidence of any broken bones.  Your injuries are likely musculoskeletal.  I recommend that you continue over-the-counter Tylenol as directed if needed for pain, alternate ice and heat to the injured areas.  Follow-up with your primary care provider for recheck.  I have also listed an orthopedic provider to arrange follow-up in 1 week if your symptoms are not improving.

## 2021-06-03 NOTE — ED Provider Notes (Signed)
[facility] EMERGENCY DEPARTMENT Provider Note   CSN: [id number] Arrival date & time: 06/03/21  0840     History Chief Complaint  Patient presents with   Arthur Cisneros. is a 65 y.o. male.   Fall Pertinent negatives include no chest pain, no abdominal pain and no shortness of breath.      Arthur Cisneros. is a 65 y.o. 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  He states that he was attempting to go to the restroom this morning around 430 when he apparently slipped and fell in the bathroom.  States that he fell backwards landing on his shoulder and back.  He does not think that he struck his head.  He denies loss of consciousness, but states he was unable to get up without assistance.  He describes pain to his lower back as "soreness across my back."  He denies any pain to his hips or lower extremities.  Denies headache, visual changes, neck pain, abdominal pain or pain of his chest.  He does take Plavix daily, no other blood thinners.  Past Medical History:  Diagnosis Date   Anxiety    pt denies   Carotid artery disease ([place])    Coronary artery disease    Multivessel disease status post CABG in 2000 at [facility]   Depression    Essential hypertension    Hyperlipidemia    Nephrolithiasis    Prostate cancer ([place]) 2016   Status post XRT   Stroke [facility]) 2011   Right MCA infarct   Type 2 diabetes mellitus [facility])     Patient Active Problem List   Diagnosis Date Noted   Sepsis ([place]) 06/11/2019   History of stroke 12/18/2017   Malignant neoplasm of prostate ([place]) 08/17/2015   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/11/2015   Spastic hemiparesis affecting nondominant side ([place]) 06/11/2015   Seizure disorder, 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[place]) 06/11/2015    Arteriosclerosis of bypass graft of coronary artery 05/19/2015   Abnormal prostate specific antigen 05/19/2015   Essential (primary) hypertension 05/19/2015   Acid reflux 05/19/2015   Borderline diabetes 05/19/2015   HLD (hyperlipidemia) 05/19/2015   Elevated prostate specific antigen (PSA) 05/19/2015   Acute ischemic vertebrobasilar artery brainstem stroke involving right-sided vessel ([place]) 12/02/2014   Headache 09/09/2014   Spondylosis of lumbar region without myelopathy or radiculopathy 08/29/2014   Arthropathy of sacroiliac joint 08/29/2014   Spastic hemiplegia affecting nondominant side ([place]) 05/09/2014   Nontraumatic shoulder pain 05/09/2014   Occlusion and stenosis of carotid artery with cerebral infarction 08/05/2013   Convulsions ([place]) 08/05/2013   Bicipital tenosynovitis 03/23/2012   Left spastic hemiparesis ([place]) 03/23/2012    Past Surgical History:  Procedure Laterality Date   CORONARY ARTERY BYPASS GRAFT     4 graft   CYSTOSCOPY WITH STENT PLACEMENT Right 06/11/2019   Procedure: CYSTOSCOPY WITH STENT PLACEMENT;  Surgeon: Lucas Mallow, MD;  Location: [facility];  Service: Urology;  Laterality: Right;   CYSTOSCOPY/URETEROSCOPY/HOLMIUM LASER/STENT PLACEMENT Right 06/06/2019   Procedure: CYSTOSCOPY/URETEROSCOPY/HOLMIUM LASER/STENT PLACEMENT;  Surgeon: Raynelle Bring, MD;  Location: [facility];  Service: Urology;  Laterality: Right;   CYSTOSCOPY/URETEROSCOPY/HOLMIUM LASER/STENT PLACEMENT Right 07/15/2019   Procedure: CYSTOSCOPY/URETEROSCOPY/STENT PLACEMENT;  Surgeon: Raynelle Bring, MD;  Location: [facility];  Service: Urology;  Laterality: Right;  CYSTOSCOPY/URETEROSCOPY/HOLMIUM LASER/STENT PLACEMENT Bilateral 01/30/2020   Procedure: CYSTOSCOPY/RETROGRADE/URETEROSCOPY /RIGHT STENT PLACEMENT;  Surgeon: Raynelle Bring, MD;  Location: [facility];  Service: Urology;  Laterality: Bilateral;   PROSTATE BIOPSY     right hand surgery         Family History  Problem Relation Age of Onset    Heart disease Mother    Diabetes Mother    Arthritis Mother    Heart disease Father    Diabetes Father    Stroke Father    Stroke Other    Gout Other    Coronary artery disease Other     Social History   Tobacco Use   Smoking status: Former    Packs/day: 0.25    Years: 15.00    Pack years: 3.75    Types: Cigarettes    Start date: 10/19/1970    Quit date: 10/19/1985    Years since quitting: 35.6   Smokeless tobacco: Current    Types: Chew  Vaping Use   Vaping Use: Never used  Substance Use Topics   Alcohol use: Not Currently    Alcohol/week: 0.0 standard drinks   Drug use: No    Home Medications Prior to Admission medications   Medication Sig Start Date End Date Taking? Authorizing Provider  acetaminophen (TYLENOL) 500 MG tablet Take 1,000 mg by mouth every 6 (six) hours as needed (pain).     [provider]  amLODipine (NORVASC) 5 MG tablet Take 5 mg by mouth daily. 11/04/20   [provider]  atorvastatin (LIPITOR) 40 MG tablet Take 40 mg by mouth at bedtime.     [provider]  cholecalciferol (VITAMIN D) 25 MCG (1000 UNIT) tablet Take 1,000 Units by mouth daily. 01/13/21   [provider]  clopidogrel (PLAVIX) 75 MG tablet Take 75 mg by mouth daily.    [provider]  fluticasone (FLONASE) 50 MCG/ACT nasal spray Place 1 spray into both nostrils daily. 03/18/21   [provider]  levETIRAcetam (KEPPRA) 250 MG tablet TAKE 1 TABLET BY MOUTH TWICE DAILY 11/17/20   Frann Rider, NP  losartan (COZAAR) 25 MG tablet Take 25 mg by mouth daily.    [provider]  metFORMIN (GLUCOPHAGE-XR) 500 MG 24 hr tablet Take 1,000 mg by mouth 2 (two) times a day.  03/01/19   [provider]  nitroGLYCERIN (NITROSTAT) 0.4 MG SL tablet Place 1 tablet (0.4 mg total) under the tongue every 5 (five) minutes x 3 doses as needed for chest pain (if no relief after 2nd dose, proceed to the ED for an evaluation or call 911). 05/28/21    Satira Sark, MD  tiZANidine (ZANAFLEX) 2 MG tablet 2mg  - 4mg  ( 1 or 2 tabs) at night as needed for spasticity 01/21/21   Frann Rider, NP  vitamin B-12 (CYANOCOBALAMIN) 1000 MCG tablet Take 1,000 mcg by mouth daily. 04/23/21   [provider]  ZETIA 10 MG tablet Take 10 mg by mouth daily. 03/22/21   [provider]    Allergies    5-alpha reductase inhibitors, Lyrica [pregabalin], Motrin [ibuprofen], and Tramadol  Review of Systems   Review of Systems  Constitutional:  Negative for chills, fatigue and fever.  Eyes:  Negative for visual disturbance.  Respiratory:  Negative for shortness of breath.   Cardiovascular:  Negative for chest pain and palpitations.  Gastrointestinal:  Negative for abdominal pain, blood in stool, nausea and vomiting.  Genitourinary:  Negative for difficulty urinating and dysuria.  Musculoskeletal:  Positive for arthralgias (right shoulder pain) and back pain. Negative for myalgias, neck pain and neck stiffness.  Skin:  Negative for color change, rash and wound.  Neurological:  Negative for dizziness, syncope, weakness and numbness.  Hematological:  Does not bruise/bleed easily.   Physical Exam Updated Vital Signs BP (!) 145/85   Pulse 63   Temp 97.7 F (36.5 C) (Oral)   Resp 16   Ht 5\' 9"  (1.753 m)   Wt 95.3 kg   SpO2 98%   BMI 31.01 kg/m   Physical Exam Vitals and nursing note reviewed.  Constitutional:      General: He is not in acute distress.    Appearance: Normal appearance.  HENT:     Head: Atraumatic.     Comments: No hematomas or abrasions of the scalp    Mouth/Throat:     Mouth: Mucous membranes are moist.  Eyes:     Extraocular Movements: Extraocular movements intact.     Conjunctiva/sclera: Conjunctivae normal.     Pupils: Pupils are equal, round, and reactive to light.  Cardiovascular:     Rate and Rhythm: Normal rate and regular rhythm.     Pulses: Normal pulses.  Pulmonary:     Effort: Pulmonary effort  is normal.     Breath sounds: Normal breath sounds.  Chest:     Chest wall: No tenderness.  Abdominal:     Palpations: Abdomen is soft.     Tenderness: There is no abdominal tenderness.  Musculoskeletal:        General: Tenderness and signs of injury present.     Cervical back: Normal range of motion. No bony tenderness.     Thoracic back: Normal.     Lumbar back: Tenderness present.     Comments: Diffuse tenderness to the bilateral lower lumbar paraspinal muscles.  No significant midline tenderness.  No edema or abrasions of the back.  Tenderness to palpation along the right scapular border and anterior right shoulder joint.  No bony step-offs or deformities.  Skin:    General: Skin is warm.     Capillary Refill: Capillary refill takes less than 2 seconds.  Neurological:     Mental Status: He is alert.    ED Results / Procedures / Treatments   Labs (all labs ordered are listed, but only abnormal results are displayed) Labs Reviewed - No data to display  EKG None  Radiology DG Lumbar Spine Complete  Result Date: 06/03/2021 CLINICAL DATA:  Fall in bathroom.  Pain radiating to the lower back EXAM: LUMBAR SPINE - COMPLETE 4+ VIEW COMPARISON:  08/12/2014 FINDINGS: No acute fracture or subluxation. Generalized spondylitic spurring. Slight lumbar dextrocurvature. Atheromatous calcification of the aorta. IMPRESSION: No acute finding. Electronically Signed   By: Monte Fantasia M.D.   On: 06/03/2021 10:56   DG Pelvis 1-2 Views  Result Date: 06/03/2021 CLINICAL DATA:  Recent fall with pelvic pain, initial encounter EXAM: PELVIS - 1 VIEW COMPARISON:  None. FINDINGS: Pelvic ring is intact. Sacral ala are unremarkable. Mild degenerative changes of the hip joints and lumbar spine are seen. Fiducial markers are noted within the prostate. No soft tissue abnormality is noted. IMPRESSION: No acute abnormality noted. Electronically Signed   By: Inez Catalina M.D.   On: 06/03/2021 10:56   DG  Shoulder Right  Result Date: 06/03/2021 CLINICAL DATA:  Right shoulder pain after fall. EXAM: RIGHT SHOULDER - 2+ VIEW COMPARISON:  None. FINDINGS: No acute fracture or dislocation. Moderate acromioclavicular osteoarthritis. Bone mineralization  is normal. Soft tissues are unremarkable. Prior CABG. IMPRESSION: 1. No acute osseous abnormality. Electronically Signed   By: Titus Dubin M.D.   On: 06/03/2021 10:56   CT Head Wo Contrast  Result Date: 06/03/2021 CLINICAL DATA:  Head trauma, minor (Age >= 65y) fall at home; Neck trauma (Age >= 65y) EXAM: CT HEAD WITHOUT CONTRAST CT CERVICAL SPINE WITHOUT CONTRAST TECHNIQUE: Multidetector CT imaging of the head and cervical spine was performed following the standard protocol without intravenous contrast. Multiplanar CT image reconstructions of the cervical spine were also generated. COMPARISON:  01/18/2011 FINDINGS: CT HEAD FINDINGS Brain: Large area of encephalomalacia in the right middle cerebral artery distribution compatible with remote infarct. No evidence of acute infarction. No intraparenchymal hemorrhage or extra-axial collection. Ex vacuo dilatation of the right lateral ventricle. No evidence of hydrocephalus. No mass lesion or mass effect. Vascular: Atherosclerotic calcifications involving the large vessels of the skull base. No unexpected hyperdense vessel. Skull: Normal. Negative for fracture or focal lesion. Sinuses/Orbits: No acute finding. Other: Negative for scalp hematoma. CT CERVICAL SPINE FINDINGS Alignment: Facet joints are aligned without dislocation or traumatic listhesis. Dens and lateral masses are aligned. Skull base and vertebrae: No acute fracture. No primary bone lesion or focal pathologic process. Soft tissues and spinal canal: No prevertebral fluid or swelling. No visible canal hematoma. Disc levels: Intervertebral disc heights are relatively well preserved. Facet arthropathy is most pronounced on the left at C3-4. Otherwise, relatively  mild cervical spondylosis. Upper chest: Included lung apices are clear. Other: Bilateral carotid atherosclerosis. IMPRESSION: 1. No acute intracranial findings. 2. Chronic right MCA distribution infarct. 3. No acute fracture or traumatic listhesis of the cervical spine. Electronically Signed   By: Davina Poke D.O.   On: 06/03/2021 10:34   CT Cervical Spine Wo Contrast  Result Date: 06/03/2021 CLINICAL DATA:  Head trauma, minor (Age >= 65y) fall at home; Neck trauma (Age >= 65y) EXAM: CT HEAD WITHOUT CONTRAST CT CERVICAL SPINE WITHOUT CONTRAST TECHNIQUE: Multidetector CT imaging of the head and cervical spine was performed following the standard protocol without intravenous contrast. Multiplanar CT image reconstructions of the cervical spine were also generated. COMPARISON:  01/18/2011 FINDINGS: CT HEAD FINDINGS Brain: Large area of encephalomalacia in the right middle cerebral artery distribution compatible with remote infarct. No evidence of acute infarction. No intraparenchymal hemorrhage or extra-axial collection. Ex vacuo dilatation of the right lateral ventricle. No evidence of hydrocephalus. No mass lesion or mass effect. Vascular: Atherosclerotic calcifications involving the large vessels of the skull base. No unexpected hyperdense vessel. Skull: Normal. Negative for fracture or focal lesion. Sinuses/Orbits: No acute finding. Other: Negative for scalp hematoma. CT CERVICAL SPINE FINDINGS Alignment: Facet joints are aligned without dislocation or traumatic listhesis. Dens and lateral masses are aligned. Skull base and vertebrae: No acute fracture. No primary bone lesion or focal pathologic process. Soft tissues and spinal canal: No prevertebral fluid or swelling. No visible canal hematoma. Disc levels: Intervertebral disc heights are relatively well preserved. Facet arthropathy is most pronounced on the left at C3-4. Otherwise, relatively mild cervical spondylosis. Upper chest: Included lung apices  are clear. Other: Bilateral carotid atherosclerosis. IMPRESSION: 1. No acute intracranial findings. 2. Chronic right MCA distribution infarct. 3. No acute fracture or traumatic listhesis of the cervical spine. Electronically Signed   By: Davina Poke D.O.   On: 06/03/2021 10:34    Procedures Procedures   Medications Ordered in ED Medications  acetaminophen (TYLENOL) tablet 1,000 mg (has no administration in time range)  ED Course  I have reviewed the triage vital signs and the nursing notes.  Pertinent labs & imaging results that were available during my care of the patient were reviewed by me and considered in my medical decision making (see chart for details).    MDM Rules/Calculators/A&P                           Patient here from home for evaluation of mechanical fall this morning.  Complains of right shoulder and low back pain secondary to his fall.  He does have left-sided weakness secondary to a CVA from 2011.  He is wearing a foot brace to the left foot.  On exam, patient well-appearing.  Alert and oriented.  Vital signs reviewed and reassuring.  Although he complains of shoulder and back pain given patient's age and injury will obtain CT imaging of head and neck as well.  On recheck, patient resting comfortably.  CT of head, C-spine, and plain film imaging of the spine and shoulder are all without evidence of acute bony injury.  Patient is ambulatory in the department with slow but steady gait.  Injuries felt to be musculoskeletal.  He is agreeable to conservative treatment with over-the-counter Tylenol and heat and ice.  He is agreeable to outpatient follow-up with PCP.  Return precautions were discussed.   Final Clinical Impression(s) / ED Diagnoses Final diagnoses:  Fall in home, initial encounter  Lumbar strain, initial encounter  Shoulder strain, right, initial encounter    Rx / DC Orders ED Discharge Orders     None        Bufford Lope 06/03/21  1132    Elnora Morrison, MD 06/07/21 1501

## 2022-01-27 ENCOUNTER — Encounter: Payer: Self-pay | Admitting: Adult Health

## 2022-01-27 ENCOUNTER — Ambulatory Visit: Payer: Medicare PPO | Admitting: Adult Health

## 2022-01-27 VITALS — BP 145/82 | HR 64 | Ht 69.0 in | Wt 214.0 lb

## 2022-01-27 DIAGNOSIS — I6522 Occlusion and stenosis of left carotid artery: Secondary | ICD-10-CM | POA: Diagnosis not present

## 2022-01-27 DIAGNOSIS — I6521 Occlusion and stenosis of right carotid artery: Secondary | ICD-10-CM

## 2022-01-27 DIAGNOSIS — Z8673 Personal history of transient ischemic attack (TIA), and cerebral infarction without residual deficits: Secondary | ICD-10-CM | POA: Diagnosis not present

## 2022-01-27 DIAGNOSIS — G40909 Epilepsy, unspecified, not intractable, without status epilepticus: Secondary | ICD-10-CM | POA: Diagnosis not present

## 2022-01-27 MED ORDER — LEVETIRACETAM 250 MG PO TABS: 250.0000 mg | ORAL_TABLET | Freq: Two times a day (BID) | ORAL | 3 refills | Status: DC

## 2022-01-27 NOTE — Patient Instructions (Signed)
No changes today - we will see you back in 1 year ? ? ? ? ?Thank you for coming to see [place] at [facility]. I hope we have been able to provide you high quality care today. ? ?You may receive a patient satisfaction survey over the next few weeks. We would appreciate your feedback and comments so that we may continue to improve ourselves and the health of our patients. ? ?

## 2022-01-27 NOTE — Progress Notes (Signed)
? ?[facility] ? ?PATIENT: Arthur Cisneros. ?DOB: Dec 19, 1955 ? ? ?REASON FOR VISIT: Follow-up for history of stroke and post stroke seizure ?HISTORY FROM: Patient ? ?Chief complaint: ?Chief Complaint  ?Patient presents with  ? Follow-up  ?  RM 2 with spouse peggy ?Pt is well and stable, no new concerns or sz since last visit   ?  ? ? ?HISTORY OF PRESENT ILLNESS: ? ?Arthur Cisneros. is a 66 y.o. 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, seizure event 2012 likely as late effect of stroke, bilateral carotid stenosis, HTN, HLD, DM and CAD ? ? ?Update 01/27/2022 JM: 66 year old male who returns for 1 year follow-up for seizure and stroke follow-up accompanied by his wife.  Overall stable without new or worsening stroke/TIA symptoms or seizure activity.  Left spastic hemiparesis and dysarthria stable.  Use of tizanidine '2mg'$  tab nightly with benefit.  Use of cane and AFO brace, denies any recent falls.  Compliant on Plavix and atorvastatin, denies side effects.  Blood pressure today 145/82.  Occasionally monitors at home which has been stable.  Compliant on Keppra 250 mg twice daily, denies side effects.  Reports routine lab work by PCP which has been satisfactory, unable to view via epic.  No new concerns at this time. ? ? ? ? ? ? ?REVIEW OF SYSTEMS: Full 14 system review of systems performed and notable only for those listed, all others are neg: Weakness, speech difficulty, pain and all other systems negative ? ?ALLERGIES: ?Allergies  ?Allergen Reactions  ? 5-Alpha Reductase Inhibitors   ? Lyrica [Pregabalin]   ?  hallucination  ? Motrin [Ibuprofen]   ?  "dr said I can not take it."  ? Tramadol   ?  Hallucinations, "legs jumping", increased appetite  ? ? ?HOME MEDICATIONS: ?Outpatient Medications Prior to Visit  ?Medication Sig Dispense Refill  ? acetaminophen (TYLENOL) 500 MG tablet Take 1,000 mg by mouth every 6 (six) hours as needed (pain).     ?  amLODipine (NORVASC) 5 MG tablet Take 5 mg by mouth daily.    ? atorvastatin (LIPITOR) 40 MG tablet Take 40 mg by mouth at bedtime.     ? cholecalciferol (VITAMIN D) 25 MCG (1000 UNIT) tablet Take 1,000 Units by mouth daily.    ? clopidogrel (PLAVIX) 75 MG tablet Take 75 mg by mouth daily.    ? fluticasone (FLONASE) 50 MCG/ACT nasal spray Place 1 spray into both nostrils daily.    ? levETIRAcetam (KEPPRA) 250 MG tablet TAKE 1 TABLET BY MOUTH TWICE DAILY 180 tablet 3  ? losartan (COZAAR) 25 MG tablet Take 25 mg by mouth daily.    ? metFORMIN (GLUCOPHAGE-XR) 500 MG 24 hr tablet Take 1,000 mg by mouth 2 (two) times a day.     ? nitroGLYCERIN (NITROSTAT) 0.4 MG SL tablet Place 1 tablet (0.4 mg total) under the tongue every 5 (five) minutes x 3 doses as needed for chest pain (if no relief after 2nd dose, proceed to the ED for an evaluation or call 911). 25 tablet 3  ? tiZANidine (ZANAFLEX) 2 MG tablet '2mg'$  - '4mg'$  ( 1 or 2 tabs) at night as needed for spasticity 60 tablet 11  ? vitamin B-12 (CYANOCOBALAMIN) 1000 MCG tablet Take 1,000 mcg by mouth daily.    ? ZETIA 10 MG tablet Take 10 mg by mouth daily.    ? ?No facility-administered medications prior to visit.  ? ? ?  PAST MEDICAL HISTORY: ?Past Medical History:  ?Diagnosis Date  ? Anxiety   ? pt denies  ? Carotid artery disease ([place])   ? Coronary artery disease   ? Multivessel disease status post CABG in [facility] at [facility]  ? Depression   ? Essential hypertension   ? Hyperlipidemia   ? Nephrolithiasis   ? Prostate cancer ([place]) 2016  ? Status post XRT  ? [facility]  ? Right MCA infarct  ? Type 2 diabetes mellitus ([place])   ? ? ?PAST SURGICAL HISTORY: ?Past Surgical History:  ?Procedure Laterality Date  ? CORONARY ARTERY BYPASS GRAFT    ? 4 graft  ? CYSTOSCOPY WITH STENT PLACEMENT Right 06/11/2019  ? Procedure: CYSTOSCOPY WITH STENT PLACEMENT;  Surgeon: Lucas Mallow, MD;  Location: [facility];  Service: Urology;  Laterality: Right;  ?  CYSTOSCOPY/URETEROSCOPY/HOLMIUM LASER/STENT PLACEMENT Right 06/06/2019  ? Procedure: CYSTOSCOPY/URETEROSCOPY/HOLMIUM LASER/STENT PLACEMENT;  Surgeon: Raynelle Bring, MD;  Location: [facility];  Service: Urology;  Laterality: Right;  ? CYSTOSCOPY/URETEROSCOPY/HOLMIUM LASER/STENT PLACEMENT Right 07/15/2019  ? Procedure: CYSTOSCOPY/URETEROSCOPY/STENT PLACEMENT;  Surgeon: Raynelle Bring, MD;  [facility]: [facility];  Service: Urology;  Laterality: Right;  ? CYSTOSCOPY/URETEROSCOPY/HOLMIUM LASER/STENT PLACEMENT Bilateral 01/30/2020  ? Procedure: CYSTOSCOPY/RETROGRADE/URETEROSCOPY /RIGHT STENT PLACEMENT;  Surgeon: Raynelle Bring, MD;  [facility];  Service: Urology;  Laterality: Bilateral;  ? PROSTATE BIOPSY    ? right hand surgery    ? ? ?FAMILY HISTORY: ?Family History  ?Problem Relation Age of Onset  ? Heart disease Mother   ? Diabetes Mother   ? Arthritis Mother   ? Heart disease Father   ? Diabetes Father   ? Stroke Father   ? Stroke Other   ? Gout Other   ? Coronary artery disease Other   ? ? ?SOCIAL HISTORY: ?Social History  ? ?Socioeconomic History  ? Marital status: Married  ?  Spouse name: [name]  ? Number of children: 5  ? Years of education: 8th   ? Highest education level: Not on file  ?Occupational History  ?  Employer: RETIRED  ?Tobacco Use  ? Smoking status: Former  ?  Packs/day: 0.25  ?  Years: 15.00  ?  Pack years: 3.75  ?  Types: Cigarettes  ?  Start date: 10/19/1970  ?  Quit date: 10/19/1985  ?  Years since quitting: 36.2  ? Smokeless tobacco: Current  ?  Types: Chew  ?Vaping Use  ? Vaping Use: Never used  ?Substance and Sexual Activity  ? Alcohol use: Not Currently  ?  Alcohol/week: 0.0 standard drinks  ? Drug use: No  ? Sexual activity: Not Currently  ?Other Topics Concern  ? Not on file  ?Social History Narrative  ? Patient lives at home with wife [name].   ? Patient has 5 children.   ? Patient has a 8th grade.   ? Patient is retired.   ? Patient is on disability.   ? Patient is right handed.   ? Patient drinks  caffeine every once in a while.   ? ?Social Determinants of Health  ? ?Financial Resource Strain: Not on file  ?Food Insecurity: Not on file  ?Transportation Needs: Not on file  ?Physical Activity: Not on file  ?Stress: Not on file  ?Social Connections: Not on file  ?Intimate Partner Violence: Not on file  ? ? ? ?PHYSICAL EXAM ? ?Vitals:  ? 01/27/22 1032  ?BP: (!) 145/82  ?Pulse: 64  ?Weight: 214 lb (97.1 kg)  ?Height:  $'5\' 9"'f$  (1.753 m)  ? ? ?Body mass index is 31.6 kg/m?. ? ?General: well developed, well nourished, pleasant middle aged Caucasian male, seated, in no evident distress ?Head: head normocephalic and atraumatic.   ?Neck: supple with no carotid or supraclavicular bruits ?Cardiovascular: regular rate and rhythm, no murmurs ?Musculoskeletal: no deformity ?Skin:  no rash/petichiae ?Vascular:  Normal pulses all extremities ?  ?Neurologic Exam ?Mental Status: Awake and fully alert.   Mild dysarthria.  Oriented to place and time. Recent and remote memory intact. Attention span, concentration and fund of knowledge appropriate. Mood and affect appropriate.  ?Cranial Nerves: Fundoscopic exam reveals sharp disc margins. Pupils equal, briskly reactive to light. Extraocular movements full without nystagmus. Visual fields full to confrontation. Hearing intact. Facial sensation intact. Left lower facial weakness.  ?Motor:  ?LUE: 3-4/5 deltoid and bicep. 2/5 tricep; no grip strength with increased tone throughout.  Limited shoulder ROM  ?LLE: 4+/5 proximal with foot drop and use of AFO brace ?Full strength right upper and lower extremity ?Sensory.: intact to touch , pinprick , position and vibratory sensation.  ?Coordination: Rapid alternating movements normal in all extremities except limited on left side. Finger-to-nose and heel-to-shin performed accurately on right side ?Gait and Station: Arises from chair without difficulty. Stance is normal. Gait demonstrates  hemiplegic gait with use of cane and AFO brace ?Reflexes:  1+ and symmetric. Toes downgoing.  ? ? ? ? ?DIAGNOSTIC DATA (LABS, IMAGING, TESTING) ?- I reviewed patient records, labs, notes, testing and imaging myself where available. ? ?Lab Results  ?Component Value Date  ? WBC 7.8 01/23/19

## 2022-02-03 ENCOUNTER — Telehealth: Payer: Self-pay | Admitting: Adult Health

## 2022-02-03 NOTE — Telephone Encounter (Signed)
[employer] no auth req-sent Arthur Cisneros  message she will reach out to the patient to schedule.  ?

## 2022-02-18 ENCOUNTER — Ambulatory Visit (HOSPITAL_COMMUNITY): Payer: Medicare PPO

## 2022-02-25 ENCOUNTER — Ambulatory Visit (HOSPITAL_COMMUNITY)
Admission: RE | Admit: 2022-02-25 | Discharge: 2022-02-25 | Disposition: A | Discharge status: Home / Self Care | Payer: Medicare PPO | Source: Ambulatory Visit | Attending: Adult Health | Admitting: Adult Health

## 2022-02-25 DIAGNOSIS — I6522 Occlusion and stenosis of left carotid artery: Secondary | ICD-10-CM | POA: Diagnosis not present

## 2022-02-25 DIAGNOSIS — I6521 Occlusion and stenosis of right carotid artery: Secondary | ICD-10-CM | POA: Insufficient documentation

## 2022-02-25 DIAGNOSIS — I6523 Occlusion and stenosis of bilateral carotid arteries: Secondary | ICD-10-CM | POA: Diagnosis not present

## 2022-02-25 NOTE — Progress Notes (Signed)
Carotid artery duplex completed. ?Refer to "CV Proc" under chart review to view preliminary results. ? ?02/25/2022 1:25 PM ?Kelby Aline., MHA, RVT, RDCS, RDMS   ?

## 2022-02-28 ENCOUNTER — Telehealth: Payer: Self-pay

## 2022-02-28 ENCOUNTER — Other Ambulatory Visit: Payer: Self-pay | Admitting: Adult Health

## 2022-02-28 DIAGNOSIS — I6522 Occlusion and stenosis of left carotid artery: Secondary | ICD-10-CM

## 2022-02-28 DIAGNOSIS — I6521 Occlusion and stenosis of right carotid artery: Secondary | ICD-10-CM

## 2022-02-28 NOTE — Telephone Encounter (Signed)
I called the pt and spoke with him and his wife and I relayed results. Pt is agreeable to vascular surgery referral.  ?He will call back if he has not heard from the office about scheduling in a week.  ?

## 2022-02-28 NOTE — Telephone Encounter (Signed)
-----   Message from Frann Rider, NP sent at 02/28/2022  8:11 AM EDT ----- ?Please advise patient that recent carotid ultrasound showed progression of left carotid narrowing currently at 60 to 79% - will refer to vascular surgery for further evaluation and possible treatment options.  ?

## 2022-03-02 NOTE — Telephone Encounter (Signed)
Pt daughter (on, [place]) requesting a call concerning pt Carotid Ultrasound results. Claims her mother and father did not understand fully and she would like to hear them herself.  ?

## 2022-03-02 NOTE — Telephone Encounter (Signed)
I called the pt's daughter and relayed results of fathers Carotid US. She verbalized understanding and appreciation for the call.  ?

## 2022-03-03 ENCOUNTER — Other Ambulatory Visit: Payer: Self-pay | Admitting: Adult Health

## 2022-03-15 ENCOUNTER — Encounter: Payer: Medicare PPO | Admitting: Vascular Surgery

## 2022-03-21 NOTE — Progress Notes (Signed)
[facility] [place] ? ?ASSESSMENT / PLAN: ?Arthur Guinta. is a 66 y.o. male with occluded right internal carotid artery asymptomatic left 60-79% carotid artery stenosis. ?The patient should continue best medical therapy for carotid artery stenosis including: ?Complete cessation from all tobacco products. ?Blood glucose control with goal A1c < 7%. ?Blood pressure control with goal blood pressure < 140/90 mmHg. ?Lipid reduction therapy with goal LDL-C <100 mg/dL (<70 if symptomatic from carotid artery stenosis).  ? Clopidogrel '75mg'$  PO QD. ?Atorvastatin 40-'80mg'$  PO QD (or other "high intensity" statin therapy). ? ?Patient is not yet a candidate for asymptomatic intervention on his left internal carotid artery stenosis.  I counseled him that should he develop symptoms attributable to this carotid lesion (i.e. temporary ischemic attack, or stroke) we would reconsider intervention.  I think a TCAR would be best for him should he reach the asymptomatic threshold (80% or greater stenosis), or if his stenosis becomes symptomatic.  He is understanding.  I will see him back in 6 months with a repeat carotid duplex. ? ?CHIEF COMPLAINT: Carotid artery stenosis and occlusion on recent duplex ? ?HISTORY OF PRESENT ILLNESS: ?Arthur Cisneros. is a 66 y.o. 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  The patient is in good spirits and can converse easily despite some aphasia.  He reports no new neurologic symptoms including visual changes, facial droop, weakness in the upper or lower extremities.  We had a long talk about the natural history of carotid artery stenosis, the rationale for therapy, the threshold for asymptomatic and symptomatic interventions, and the rationale for withholding intervention until stroke risk becomes higher. ? ?Past Medical History:  ?Diagnosis Date  ? Anxiety   ? pt  denies  ? Carotid artery disease ([place])   ? Coronary artery disease   ? Multivessel disease status post CABG in 2000 at [facility]  ? Depression   ? Essential hypertension   ? Hyperlipidemia   ? Nephrolithiasis   ? Prostate cancer ([place]) 2016  ? Status post XRT  ? Stroke [facility]) 2011  ? Right MCA infarct  ? Type 2 diabetes mellitus ([place])   ? ? ?Past Surgical History:  ?Procedure Laterality Date  ? CORONARY ARTERY BYPASS GRAFT    ? 4 graft  ? CYSTOSCOPY WITH STENT PLACEMENT Right 06/11/2019  ? Procedure: CYSTOSCOPY WITH STENT PLACEMENT;  Surgeon: Lucas Mallow, MD;  Location: [facility];  Service: Urology;  Laterality: Right;  ? CYSTOSCOPY/URETEROSCOPY/HOLMIUM LASER/STENT PLACEMENT Right 06/06/2019  ? Procedure: CYSTOSCOPY/URETEROSCOPY/HOLMIUM LASER/STENT PLACEMENT;  Surgeon: Raynelle Bring, MD;  [facility];  Service: Urology;  Laterality: Right;  ? CYSTOSCOPY/URETEROSCOPY/HOLMIUM LASER/STENT PLACEMENT Right 07/15/2019  ? Procedure: CYSTOSCOPY/URETEROSCOPY/STENT PLACEMENT;  Surgeon: Raynelle Bring, MD;  [facility];  Service: Urology;  Laterality: Right;  ? CYSTOSCOPY/URETEROSCOPY/HOLMIUM LASER/STENT PLACEMENT Bilateral 01/30/2020  ? Procedure: CYSTOSCOPY/RETROGRADE/URETEROSCOPY /RIGHT STENT PLACEMENT;  Surgeon: Raynelle Bring, MD;  [facility];  Service: Urology;  Laterality: Bilateral;  ? PROSTATE BIOPSY    ? right hand surgery    ? ? ?Family History  ?Problem Relation Age of Onset  ? Heart disease Mother   ? Diabetes Mother   ? Arthritis Mother   ? Heart disease Father   ? Diabetes Father   ? Stroke Father   ? Stroke Other   ? Gout Other   ? Coronary artery disease Other   ? ? ?  Social History  ? ?Socioeconomic History  ? Marital status: Married  ?  Spouse name: [name]  ? Number of children: 5  ? Years of education: 8th   ? Highest education level: Not on file  ?Occupational History  ?  Employer: RETIRED  ?Tobacco Use  ? Smoking status: Former  ?  Packs/day: 0.25  ?  Years: 15.00  ?  Pack years: 3.75   ?  Types: Cigarettes  ?  Start date: 10/19/1970  ?  Quit date: 10/19/1985  ?  Years since quitting: 36.4  ? Smokeless tobacco: Current  ?  Types: Chew  ?Vaping Use  ? Vaping Use: Never used  ?Substance and Sexual Activity  ? Alcohol use: Not Currently  ?  Alcohol/week: 0.0 standard drinks  ? Drug use: No  ? Sexual activity: Not Currently  ?Other Topics Concern  ? Not on file  ?Social History Narrative  ? Patient lives at home with wife [name].   ? Patient has 5 children.   ? Patient has a 8th grade.   ? Patient is retired.   ? Patient is on disability.   ? Patient is right handed.   ? Patient drinks caffeine every once in a while.   ? ?Social Determinants of Health  ? ?Financial Resource Strain: Not on file  ?Food Insecurity: Not on file  ?Transportation Needs: Not on file  ?Physical Activity: Not on file  ?Stress: Not on file  ?Social Connections: Not on file  ?Intimate Partner Violence: Not on file  ? ? ?Allergies  ?Allergen Reactions  ? 5-Alpha Reductase Inhibitors   ? Lyrica [Pregabalin]   ?  hallucination  ? Motrin [Ibuprofen]   ?  "dr said I can not take it."  ? Tramadol   ?  Hallucinations, "legs jumping", increased appetite  ? ? ?Current Outpatient Medications  ?Medication Sig Dispense Refill  ? acetaminophen (TYLENOL) 500 MG tablet Take 1,000 mg by mouth every 6 (six) hours as needed (pain).     ? amLODipine (NORVASC) 5 MG tablet Take 5 mg by mouth daily.    ? atorvastatin (LIPITOR) 40 MG tablet Take 40 mg by mouth at bedtime.     ? cholecalciferol (VITAMIN D) 25 MCG (1000 UNIT) tablet Take 1,000 Units by mouth daily.    ? clopidogrel (PLAVIX) 75 MG tablet Take 75 mg by mouth daily.    ? fluticasone (FLONASE) 50 MCG/ACT nasal spray Place 1 spray into both nostrils daily.    ? levETIRAcetam (KEPPRA) 250 MG tablet Take 1 tablet (250 mg total) by mouth 2 (two) times daily. 180 tablet 3  ? losartan (COZAAR) 25 MG tablet Take 25 mg by mouth daily.    ? metFORMIN (GLUCOPHAGE-XR) 500 MG 24 hr tablet Take 1,000 mg by  mouth 2 (two) times a day.     ? nitroGLYCERIN (NITROSTAT) 0.4 MG SL tablet Place 1 tablet (0.4 mg total) under the tongue every 5 (five) minutes x 3 doses as needed for chest pain (if no relief after 2nd dose, proceed to the ED for an evaluation or call 911). 25 tablet 3  ? tiZANidine (ZANAFLEX) 2 MG tablet TAKE 1 OR 2 TABLET BY MOUTH AT BEDTIME AS NEEDED FOR MUSCLE SPASM 60 tablet 0  ? vitamin B-12 (CYANOCOBALAMIN) 1000 MCG tablet Take 1,000 mcg by mouth daily.    ? ZETIA 10 MG tablet Take 10 mg by mouth daily.    ? ?No current facility-administered medications for this visit.  ? ? ?  PHYSICAL EXAM ?Vitals:  ? 03/22/22 1002  ?BP: 123/78  ?Pulse: (!) 56  ?Resp: 20  ?Temp: 98.7 ?F (37.1 ?C)  ?SpO2: 96%  ?Weight: 216 lb (98 kg)  ?Height: '5\' 9"'$  (1.753 m)  ? ? ?Constitutional: Chronically ill-appearing gentleman in no acute distress. ?Neurologic: Significant left-sided weakness in the upper and lower extremity. ?Cardiac: Regular rate and rhythm ?Respiratory: Unlabored breathing ?Abdominal:  soft, non-tender, non-distended.  ?Peripheral vascular: 2+ radial pulses ? ?PERTINENT LABORATORY AND RADIOLOGIC DATA ? ?Most recent CBC ? ?  Latest Ref Rng & Units 01/23/2020  ?  1:27 PM 07/09/2019  ? 11:03 AM 06/14/2019  ?  6:28 AM  ?CBC  ?WBC 4.0 - 10.5 K/uL 7.8   8.0   6.5    ?Hemoglobin 13.0 - 17.0 g/dL 14.8   14.8   12.7    ?Hematocrit 39.0 - 52.0 % 45.5   44.3   39.3    ?Platelets 150 - 400 K/uL 237   208   170    ?  ? ?Most recent CMP ? ?  Latest Ref Rng & Units 01/23/2020  ?  1:27 PM 07/09/2019  ? 11:03 AM 06/14/2019  ?  6:28 AM  ?CMP  ?Glucose 70 - 99 mg/dL 92   135   126    ?BUN 8 - 23 mg/dL '15   16   11    '$ ?Creatinine 0.61 - 1.24 mg/dL 1.14   0.78   0.74    ?Sodium 135 - 145 mmol/L 142   139   141    ?Potassium 3.5 - 5.1 mmol/L 3.8   3.6   3.6    ?Chloride 98 - 111 mmol/L 108   108   110    ?CO2 22 - 32 mmol/L '24   21   25    '$ ?Calcium 8.9 - 10.3 mg/dL 8.8   9.3   8.0    ? ? ?Renal function ?CrCl cannot be calculated (Patient's  most recent lab result is older than the maximum 21 days allowed.). ? ?Hgb A1c MFr Bld (%)  ?Date Value  ?01/23/2020 5.5  ? ? ?LDL Cholesterol  ?Date Value Ref Range Status  ?07/09/2010 (H) 0 - 99 mg/dL Fi

## 2022-03-22 ENCOUNTER — Encounter: Payer: Self-pay | Admitting: Vascular Surgery

## 2022-03-22 ENCOUNTER — Ambulatory Visit: Payer: Medicare PPO | Admitting: Vascular Surgery

## 2022-03-22 VITALS — BP 123/78 | HR 56 | Temp 98.7°F | Resp 20 | Ht 69.0 in | Wt 216.0 lb

## 2022-03-22 DIAGNOSIS — I6522 Occlusion and stenosis of left carotid artery: Secondary | ICD-10-CM

## 2022-03-22 DIAGNOSIS — I6523 Occlusion and stenosis of bilateral carotid arteries: Secondary | ICD-10-CM | POA: Diagnosis not present

## 2022-03-22 DIAGNOSIS — I6521 Occlusion and stenosis of right carotid artery: Secondary | ICD-10-CM | POA: Diagnosis not present

## 2022-03-25 ENCOUNTER — Other Ambulatory Visit: Payer: Self-pay | Admitting: *Deleted

## 2022-03-25 DIAGNOSIS — I6521 Occlusion and stenosis of right carotid artery: Secondary | ICD-10-CM

## 2022-03-25 DIAGNOSIS — I6522 Occlusion and stenosis of left carotid artery: Secondary | ICD-10-CM

## 2022-05-11 ENCOUNTER — Other Ambulatory Visit: Payer: Self-pay | Admitting: Adult Health

## 2022-06-21 NOTE — Progress Notes (Unsigned)
Cardiology Office Note  Date: 06/22/2022   ID: Arthur Manges., DOB 07/26/1956, MRN [id number]  PCP:  [facility], [place]  Cardiologist:  Rozann Lesches, MD Electrophysiologist:  None   Chief Complaint  Patient presents with   Cardiac follow-up    History of Present Illness: Arthur Yim. is a 66 y.o. male last seen in July 2022.  He is here today with family member for a follow-up visit.  He does not report any active angina or nitroglycerin use.  States that he has been compliant with his medications and will have lab work with PCP later in the year.  Follow-up with [facility] noted in May at which point the patient saw Dr. Stanford Breed, I reviewed the note.  I personally reviewed his ECG today which shows sinus rhythm with old inferior infarct pattern.  No change in functional deficits from prior stroke.  Past Medical History:  Diagnosis Date   Carotid artery disease ([place])    Coronary artery disease    Multivessel disease status post CABG in 2000 at [facility]   Depression    Essential hypertension    Hyperlipidemia    Nephrolithiasis    Prostate cancer ([place]) 2016   Status post XRT   Stroke [facility]) 2011   Right MCA infarct   Type 2 diabetes mellitus ([place])     Past Surgical History:  Procedure Laterality Date   CORONARY ARTERY BYPASS GRAFT     4 graft   CYSTOSCOPY WITH STENT PLACEMENT Right 06/11/2019   Procedure: CYSTOSCOPY WITH STENT PLACEMENT;  Surgeon: Lucas Mallow, MD;  Location: [facility];  Service: Urology;  Laterality: Right;   CYSTOSCOPY/URETEROSCOPY/HOLMIUM LASER/STENT PLACEMENT Right 06/06/2019   Procedure: CYSTOSCOPY/URETEROSCOPY/HOLMIUM LASER/STENT PLACEMENT;  Surgeon: Raynelle Bring, MD;  Location: [facility];  Service: Urology;  Laterality: Right;   CYSTOSCOPY/URETEROSCOPY/HOLMIUM LASER/STENT PLACEMENT Right 07/15/2019   Procedure: CYSTOSCOPY/URETEROSCOPY/STENT PLACEMENT;  Surgeon: Raynelle Bring, MD;  Location: [facility];  Service: Urology;   Laterality: Right;   CYSTOSCOPY/URETEROSCOPY/HOLMIUM LASER/STENT PLACEMENT Bilateral 01/30/2020   Procedure: CYSTOSCOPY/RETROGRADE/URETEROSCOPY /RIGHT STENT PLACEMENT;  Surgeon: Raynelle Bring, MD;  Location: [facility];  Service: Urology;  Laterality: Bilateral;   PROSTATE BIOPSY     right hand surgery      Current Outpatient Medications  Medication Sig Dispense Refill   acetaminophen (TYLENOL) 500 MG tablet Take 1,000 mg by mouth every 6 (six) hours as needed (pain).      amLODipine (NORVASC) 5 MG tablet Take 5 mg by mouth daily.     atorvastatin (LIPITOR) 40 MG tablet Take 40 mg by mouth at bedtime.      cholecalciferol (VITAMIN D) 25 MCG (1000 UNIT) tablet Take 1,000 Units by mouth daily.     clopidogrel (PLAVIX) 75 MG tablet Take 75 mg by mouth daily.     fluticasone (FLONASE) 50 MCG/ACT nasal spray Place 1 spray into both nostrils daily.     levETIRAcetam (KEPPRA) 250 MG tablet Take 1 tablet (250 mg total) by mouth 2 (two) times daily. 180 tablet 3   losartan (COZAAR) 25 MG tablet Take 25 mg by mouth daily.     metFORMIN (GLUCOPHAGE-XR) 500 MG 24 hr tablet Take 1,000 mg by mouth 2 (two) times a day.      nitroGLYCERIN (NITROSTAT) 0.4 MG SL tablet Place 1 tablet (0.4 mg total) under the tongue every 5 (five) minutes x 3 doses as needed for chest pain (if no relief after 2nd dose, proceed to the ED  for an evaluation or call 911). 25 tablet 3   tiZANidine (ZANAFLEX) 2 MG tablet TAKE 1 OR 2 TABLET BY MOUTH AT BEDTIME AS NEEDED FOR MUSCLE SPASM 60 tablet 0   vitamin B-12 (CYANOCOBALAMIN) 1000 MCG tablet Take 1,000 mcg by mouth daily.     ZETIA 10 MG tablet Take 10 mg by mouth daily.     No current facility-administered medications for this visit.   Allergies:  5-alpha reductase inhibitors, Lyrica [pregabalin], Motrin [ibuprofen], and Tramadol   ROS: No palpitations or syncope.  Physical Exam: VS:  BP 134/84   Pulse 64   Ht '5\' 9"'$  (1.753 m)   Wt 217 lb (98.4 kg)   SpO2 96%   BMI 32.05  kg/m , BMI Body mass index is 32.05 kg/m.  Wt Readings from Last 3 Encounters:  06/22/22 217 lb (98.4 kg)  03/22/22 216 lb (98 kg)  01/27/22 214 lb (97.1 kg)    General: Patient appears comfortable at rest. HEENT: Conjunctiva and lids normal. Lungs: Clear to auscultation, nonlabored breathing at rest. Cardiac: Regular rate and rhythm, no S3 or significant systolic murmur, no pericardial rub. Extremities: No pitting edema.  ECG:  An ECG dated 05/28/2021 was personally reviewed today and demonstrated:  Sinus rhythm with possible old inferior infarct pattern.  Recent Labwork:  May 2022: BUN 18, creatinine 1.5, potassium 4.6, cholesterol 149, triglycerides 198, HDL 31, LDL 84, hemoglobin A1c 5.9%  Other Studies Reviewed Today:  Echocardiogram 09/30/2015: - Left ventricle: The cavity size was normal. Wall thickness was    increased in a pattern of mild LVH. Systolic function was normal.    The estimated ejection fraction was in the range of 55% to 60%.    Images were inadequate for LV wall motion assessment. Doppler    parameters are consistent with abnormal left ventricular    relaxation (grade 1 diastolic dysfunction).  - Aortic valve: Mildly calcified annulus. Trileaflet.  - Aorta: Mild aortic root dilatation. Aortic root dimension: 41 mm    (ED).  - Mitral valve: Calcified annulus. Normal thickness leaflets .  - Right ventricle: Systolic function was mildly reduced.    Lexiscan Myoview 10/02/2015: There was no ST segment deviation noted during stress. Defect 1: There is a medium defect of mild severity present in the basal inferior, basal inferolateral, mid inferior and mid inferolateral location. Findings consistent with prior myocardial infarction. Nuclear stress EF: 65%. This is a low risk study.  Carotid Dopplers 02/25/2022: Summary:  Right Carotid: Evidence consistent with a total occlusion of the right  ICA.   Left Carotid: Velocities in the left ICA are consistent  with a 60-79%  stenosis.                Progression of stenosis when compared to prior study.   Vertebrals:  Bilateral vertebral arteries demonstrate antegrade flow.  Subclavians: Normal flow hemodynamics were seen in bilateral subclavian               arteries.   Assessment and Plan:  1.  Multivessel CAD status post CABG in 2000.  Will continue with observation on medical therapy in the absence of worsening angina symptoms.  ECG reviewed and stable.  Continue Plavix, Norvasc, Lipitor, Cozaar, and as needed nitroglycerin.  2.  Mixed hyperlipidemia on Lipitor and Zetia.  Last LDL was 84.  Keep follow-up lab work with PCP later in the year.  3.  Carotid artery disease followed by Dr. Stanford Breed.  Doppler studies from April are  noted above.  Continue antiplatelet therapy and statin.  4.  Essential hypertension, systolic in the 569V today.  No changes made to current regimen.  Medication Adjustments/Labs and Tests Ordered: Current medicines are reviewed at length with the patient today.  Concerns regarding medicines are outlined above.   Tests Ordered: Orders Placed This Encounter  Procedures   EKG 12-Lead    Medication Changes: No orders of the defined types were placed in this encounter.   Disposition:  Follow up  1 year.  Signed, Satira Sark, MD, [facility] 06/22/2022 1:52 PM    [place] [facility] [place], [place], [place] [address] Phone: [phone]; Fax: [phone]

## 2022-06-22 ENCOUNTER — Encounter: Payer: Self-pay | Admitting: Cardiology

## 2022-06-22 ENCOUNTER — Ambulatory Visit: Payer: Medicare PPO | Admitting: Cardiology

## 2022-06-22 VITALS — BP 134/84 | HR 64 | Ht 69.0 in | Wt 217.0 lb

## 2022-06-22 DIAGNOSIS — I25119 Atherosclerotic heart disease of native coronary artery with unspecified angina pectoris: Secondary | ICD-10-CM | POA: Diagnosis not present

## 2022-06-22 DIAGNOSIS — E782 Mixed hyperlipidemia: Secondary | ICD-10-CM | POA: Diagnosis not present

## 2022-06-22 DIAGNOSIS — I10 Essential (primary) hypertension: Secondary | ICD-10-CM | POA: Diagnosis not present

## 2022-06-22 DIAGNOSIS — I6523 Occlusion and stenosis of bilateral carotid arteries: Secondary | ICD-10-CM | POA: Diagnosis not present

## 2022-06-22 NOTE — Patient Instructions (Signed)

## 2022-07-19 ENCOUNTER — Other Ambulatory Visit: Payer: Self-pay | Admitting: *Deleted

## 2022-07-19 MED ORDER — TIZANIDINE HCL 2 MG PO TABS: ORAL_TABLET | ORAL | 2 refills | Status: DC

## 2022-08-11 ENCOUNTER — Ambulatory Visit (INDEPENDENT_AMBULATORY_CARE_PROVIDER_SITE_OTHER): Payer: Medicare PPO

## 2022-08-11 ENCOUNTER — Ambulatory Visit
Admission: EM | Admit: 2022-08-11 | Discharge: 2022-08-11 | Disposition: A | Discharge status: Home / Self Care | Payer: Medicare PPO | Attending: Family Medicine | Admitting: Family Medicine

## 2022-08-11 ENCOUNTER — Encounter: Payer: Self-pay | Admitting: Emergency Medicine

## 2022-08-11 ENCOUNTER — Other Ambulatory Visit: Payer: Medicare PPO

## 2022-08-11 DIAGNOSIS — M25572 Pain in left ankle and joints of left foot: Secondary | ICD-10-CM

## 2022-08-11 NOTE — ED Triage Notes (Signed)
Patient c/o left lower leg, ankle and foot swelling x 1 day.  Patient denies any injury.  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

## 2022-08-13 NOTE — ED Provider Notes (Signed)
[facility]    CSN: [id number] Arrival date & time: 08/11/22  1558      History   Chief Complaint Chief Complaint  Patient presents with   Ankle Pain    Ankle swollen foot swollen - Entered by patient    HPI Arthur Cisneros. is a 66 y.o. male.   Patient presenting today with 1 day history of left ankle and dorsal foot pain and swelling. Denies any known injury prior to onset but does have drop foot in this foot requiring a daily brace to assist with ambulation. States his ROM is at his baseline, no discoloration, significant numbness or tingling. So far trying voltaren gel with minimal relief.     Past Medical History:  Diagnosis Date   Carotid artery disease ([place])    Coronary artery disease    Multivessel disease status post CABG in 2000 at [facility]   Depression    Essential hypertension    Hyperlipidemia    Nephrolithiasis    Prostate cancer ([place]) 2016   Status post XRT   Stroke [facility]) 2011   Right MCA infarct   Type 2 diabetes mellitus [facility])     Patient Active Problem List   Diagnosis Date Noted   Sepsis ([place]) 06/11/2019   History of stroke 12/18/2017   Malignant neoplasm of prostate ([place]) 08/17/2015   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/11/2015   Spastic hemiparesis affecting nondominant side ([place]) 06/11/2015   Seizure disorder, 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[place]) 06/11/2015   Arteriosclerosis of bypass graft of coronary artery 05/19/2015   Abnormal prostate specific antigen 05/19/2015   Essential (primary) hypertension 05/19/2015   Acid reflux 05/19/2015   Borderline diabetes 05/19/2015   HLD (hyperlipidemia) 05/19/2015   Elevated prostate specific antigen (PSA) 05/19/2015   Acute ischemic vertebrobasilar artery brainstem stroke involving right-sided vessel ([place]) 12/02/2014   Headache 09/09/2014   Spondylosis of lumbar region without myelopathy or radiculopathy 08/29/2014   Arthropathy of sacroiliac joint 08/29/2014    Spastic hemiplegia affecting nondominant side ([place]) 05/09/2014   Nontraumatic shoulder pain 05/09/2014   Occlusion and stenosis of carotid artery with cerebral infarction 08/05/2013   Convulsions ([place]) 08/05/2013   Bicipital tenosynovitis 03/23/2012   Left spastic hemiparesis ([place]) 03/23/2012    Past Surgical History:  Procedure Laterality Date   CORONARY ARTERY BYPASS GRAFT     4 graft   CYSTOSCOPY WITH STENT PLACEMENT Right 06/11/2019   Procedure: CYSTOSCOPY WITH STENT PLACEMENT;  Surgeon: Lucas Mallow, MD;  Location: [facility];  Service: Urology;  Laterality: Right;   CYSTOSCOPY/URETEROSCOPY/HOLMIUM LASER/STENT PLACEMENT Right 06/06/2019   Procedure: CYSTOSCOPY/URETEROSCOPY/HOLMIUM LASER/STENT PLACEMENT;  Surgeon: Raynelle Bring, MD;  Location: [facility];  Service: Urology;  Laterality: Right;   CYSTOSCOPY/URETEROSCOPY/HOLMIUM LASER/STENT PLACEMENT Right 07/15/2019   Procedure: CYSTOSCOPY/URETEROSCOPY/STENT PLACEMENT;  Surgeon: Raynelle Bring, MD;  Location: [facility];  Service: Urology;  Laterality: Right;   CYSTOSCOPY/URETEROSCOPY/HOLMIUM LASER/STENT PLACEMENT Bilateral 01/30/2020   Procedure: CYSTOSCOPY/RETROGRADE/URETEROSCOPY /RIGHT STENT PLACEMENT;  Surgeon: Raynelle Bring, MD;  Location[facility];  Service: Urology;  Laterality: Bilateral;   PROSTATE BIOPSY     right hand surgery         Home Medications    Prior to Admission medications   Medication Sig Start Date End Date Taking? Authorizing Provider  acetaminophen (TYLENOL) 500 MG tablet Take 1,000 mg by mouth every 6 (six) hours as needed (pain).    Yes [provider]  amLODipine (NORVASC) 5 MG tablet Take 5 mg by mouth daily.  11/04/20  Yes [provider]  atorvastatin (LIPITOR) 40 MG tablet Take 40 mg by mouth at bedtime.    Yes [provider]  cholecalciferol (VITAMIN D) 25 MCG (1000 UNIT) tablet Take 1,000 Units by mouth daily. 01/13/21  Yes [provider]  clopidogrel (PLAVIX) 75 MG  tablet Take 75 mg by mouth daily.   Yes [provider]  fluticasone (FLONASE) 50 MCG/ACT nasal spray Place 1 spray into both nostrils daily. 03/18/21  Yes [provider]  levETIRAcetam (KEPPRA) 250 MG tablet Take 1 tablet (250 mg total) by mouth 2 (two) times daily. 01/27/22  Yes McCue, Janett Billow, NP  losartan (COZAAR) 25 MG tablet Take 25 mg by mouth daily.   Yes [provider]  metFORMIN (GLUCOPHAGE-XR) 500 MG 24 hr tablet Take 1,000 mg by mouth 2 (two) times a day.  03/01/19  Yes [provider]  nitroGLYCERIN (NITROSTAT) 0.4 MG SL tablet Place 1 tablet (0.4 mg total) under the tongue every 5 (five) minutes x 3 doses as needed for chest pain (if no relief after 2nd dose, proceed to the ED for an evaluation or call 911). 05/28/21  Yes Satira Sark, MD  tiZANidine (ZANAFLEX) 2 MG tablet 1-2 tabs at night PRN 07/19/22  Yes McCue, Janett Billow, NP  vitamin B-12 (CYANOCOBALAMIN) 1000 MCG tablet Take 1,000 mcg by mouth daily. 04/23/21  Yes [provider]  ZETIA 10 MG tablet Take 10 mg by mouth daily. 03/22/21  Yes [provider]    Family History Family History  Problem Relation Age of Onset   Heart disease Mother    Diabetes Mother    Arthritis Mother    Heart disease Father    Diabetes Father    Stroke Father    Stroke Other    Gout Other    Coronary artery disease Other     Social History Social History   Tobacco Use   Smoking status: Former    Packs/day: 0.25    Years: 15.00    Total pack years: 3.75    Types: Cigarettes    Start date: 10/19/1970    Quit date: 10/19/1985    Years since quitting: 36.8   Smokeless tobacco: Current    Types: Chew  Vaping Use   Vaping Use: Never used  Substance Use Topics   Alcohol use: Not Currently    Alcohol/week: 0.0 standard drinks of alcohol   Drug use: No     Allergies   5-alpha reductase inhibitors, Lyrica [pregabalin], Motrin [ibuprofen], and Tramadol   Review of Systems Review  of Systems PER HPI  Physical Exam Triage Vital Signs ED Triage Vitals  Enc Vitals Group     BP 08/11/22 1700 (!) 152/86     Pulse Rate 08/11/22 1700 60     Resp 08/11/22 1700 18     Temp 08/11/22 1700 98.2 F (36.8 C)     Temp Source 08/11/22 1700 Oral     SpO2 08/11/22 1700 93 %     Weight --      Height --      Head Circumference --      Peak Flow --      Pain Score 08/11/22 1702 5     Pain Loc --      Pain Edu? --      Excl. in [place]? --    No data found.  Updated Vital Signs BP (!) 152/86 (BP Location: Right Arm)   Pulse 60  Temp 98.2 F (36.8 C) (Oral)   Resp 18   SpO2 93%   Visual Acuity Right Eye Distance:   Left Eye Distance:   Bilateral Distance:    Right Eye Near:   Left Eye Near:    Bilateral Near:     Physical Exam Vitals and nursing note reviewed.  Constitutional:      General: He is not in acute distress.    Appearance: He is well-developed.  HENT:     Head: Atraumatic.     Mouth/Throat:     Mouth: Mucous membranes are moist.  Eyes:     General: No scleral icterus.    Conjunctiva/sclera: Conjunctivae normal.  Cardiovascular:     Rate and Rhythm: Normal rate.  Pulmonary:     Effort: Pulmonary effort is normal. No respiratory distress.  Musculoskeletal:        General: Swelling and tenderness present. No deformity or signs of injury. Normal range of motion.     Cervical back: Normal range of motion and neck supple.     Comments: Trace edema diffusely to left ankle. Worst left medial. Ttp diffusely in this area as well. No bony deformities palpable  Skin:    General: Skin is warm and dry.     Findings: No bruising, erythema or rash.  Neurological:     General: No focal deficit present.     Mental Status: He is alert and oriented to person, place, and time.     Deep Tendon Reflexes: Reflexes are normal and symmetric.  Psychiatric:        Mood and Affect: Mood normal.        Thought Content: Thought content normal.        Judgment:  Judgment normal.      UC Treatments / Results  Labs (all labs ordered are listed, but only abnormal results are displayed) Labs Reviewed - No data to display  EKG   Radiology DG Ankle Complete Left  Result Date: 08/11/2022 CLINICAL DATA:  Left ankle pain and swelling.  No known injury EXAM: LEFT ANKLE COMPLETE - 3+ VIEW COMPARISON:  None Available. FINDINGS: Normal alignment no fracture. Mild irregularity of the medial malleolus and distal fibula likely due to chronic injury. Ankle joint space normal. Mild calcaneal spurring. Generalized osteopenia. Mild arterial calcification IMPRESSION: No acute abnormality. Mild irregularity of the medial malleolus and distal fibula likely due to chronic injury. Electronically Signed   By: Franchot Gallo M.D.   On: 08/11/2022 18:00    Procedures Procedures (including critical care time)  Medications Ordered in UC Medications - No data to display  Initial Impression / Assessment and Plan / UC Course  I have reviewed the triage vital signs and the nursing notes.  Pertinent labs & imaging results that were available during my care of the patient were reviewed by me and considered in my medical decision making (see chart for details).     X-ray of the left ankle neg for acute bony abnormality, suspect a soft tissue strain or irritation from brace. Continue voltaren, tylenol, epsom salt soaks, elevation, ice, compression wrap. F/u with Ortho if not resolving.   Final Clinical Impressions(s) / UC Diagnoses   Final diagnoses:  Acute left ankle pain   Discharge Instructions   None    ED Prescriptions   None    PDMP not reviewed this encounter.   Merrie Roof [place], [place] 08/13/22 [phone]

## 2022-09-12 ENCOUNTER — Encounter: Payer: Self-pay | Admitting: Vascular Surgery

## 2022-09-12 NOTE — Progress Notes (Signed)
This encounter was created in error - please disregard.

## 2022-09-26 NOTE — Progress Notes (Unsigned)
[facility] [place]  ASSESSMENT / PLAN: Arthur Cisneros. is a 66 y.o. male with occluded right internal carotid artery asymptomatic left 60-79% carotid artery stenosis. The patient should continue best medical therapy for carotid artery stenosis including: Complete cessation from all tobacco products. Blood glucose control with goal A1c < 7%. Blood pressure control with goal blood pressure < 140/90 mmHg. Lipid reduction therapy with goal LDL-C <100 mg/dL (<70 if symptomatic from carotid artery stenosis).   Clopidogrel '75mg'$  PO QD. Atorvastatin 40-'80mg'$  PO QD (or other "high intensity" statin therapy).  Patient is not yet a candidate for asymptomatic intervention on his left internal carotid artery stenosis.  I counseled him that should he develop symptoms attributable to this carotid lesion (i.e. temporary ischemic attack, or stroke) we would reconsider intervention.  I think a TCAR would be best for him should he reach the asymptomatic threshold (80% or greater stenosis), or if his stenosis becomes symptomatic.  He is understanding.  I will see him back in 6 months with a repeat carotid duplex.  CHIEF COMPLAINT: Carotid artery stenosis and occlusion on recent duplex  HISTORY OF PRESENT ILLNESS: Arthur Cisneros. is a 66 y.o. 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  The patient is in good spirits and can converse easily despite some aphasia.  He reports no new neurologic symptoms including visual changes, facial droop, weakness in the upper or lower extremities.  We had a long talk about the natural history of carotid artery stenosis, the rationale for therapy, the threshold for asymptomatic and symptomatic interventions, and the rationale for withholding intervention until stroke risk becomes higher.  Past Medical History:  Diagnosis Date   Carotid artery  disease ([place])    Coronary artery disease    Multivessel disease status post CABG in 2000 at [facility]   Depression    Essential hypertension    Hyperlipidemia    Nephrolithiasis    Prostate cancer ([place]) 2016   Status post XRT   Stroke [facility]) 2011   Right MCA infarct   Type 2 diabetes mellitus ([place])     Past Surgical History:  Procedure Laterality Date   CORONARY ARTERY BYPASS GRAFT     4 graft   CYSTOSCOPY WITH STENT PLACEMENT Right 06/11/2019   Procedure: CYSTOSCOPY WITH STENT PLACEMENT;  Surgeon: Lucas Mallow, MD;  Location: [facility];  Service: Urology;  Laterality: Right;   CYSTOSCOPY/URETEROSCOPY/HOLMIUM LASER/STENT PLACEMENT Right 06/06/2019   Procedure: CYSTOSCOPY/URETEROSCOPY/HOLMIUM LASER/STENT PLACEMENT;  Surgeon: Raynelle Bring, MD;  Location: [facility];  Service: Urology;  Laterality: Right;   CYSTOSCOPY/URETEROSCOPY/HOLMIUM LASER/STENT PLACEMENT Right 07/15/2019   Procedure: CYSTOSCOPY/URETEROSCOPY/STENT PLACEMENT;  Surgeon: Raynelle Bring, MD;  [facility];  Service: Urology;  Laterality: Right;   CYSTOSCOPY/URETEROSCOPY/HOLMIUM LASER/STENT PLACEMENT Bilateral 01/30/2020   Procedure: CYSTOSCOPY/RETROGRADE/URETEROSCOPY /RIGHT STENT PLACEMENT;  Surgeon: Raynelle Bring, MD;  [facility];  Service: Urology;  Laterality: Bilateral;   PROSTATE BIOPSY     right hand surgery      Family History  Problem Relation Age of Onset   Heart disease Mother    Diabetes Mother    Arthritis Mother    Heart disease Father    Diabetes Father    Stroke Father    Stroke Other    Gout Other    Coronary artery disease Other     Social History   Socioeconomic History  Marital status: Married    Spouse name: Arthur Cisneros   Number of children: 5   Years of education: 8th    Highest education level: Not on file  Occupational History    Employer: RETIRED  Tobacco Use   Smoking status: Former    Packs/day: 0.25    Years: 15.00    Total pack years: 3.75    Types:  Cigarettes    Start date: 10/19/1970    Quit date: 10/19/1985    Years since quitting: 36.9   Smokeless tobacco: Current    Types: Chew  Vaping Use   Vaping Use: Never used  Substance and Sexual Activity   Alcohol use: Not Currently    Alcohol/week: 0.0 standard drinks of alcohol   Drug use: No   Sexual activity: Not Currently  Other Topics Concern   Not on file  Social History Narrative   Patient lives at home with wife Arthur Cisneros.    Patient has 5 children.    Patient has a 8th grade.    Patient is retired.    Patient is on disability.    Patient is right handed.    Patient drinks caffeine every once in a while.    Social Determinants of Health   Financial Resource Strain: Not on file  Food Insecurity: Not on file  Transportation Needs: Not on file  Physical Activity: Not on file  Stress: Not on file  Social Connections: Not on file  Intimate Partner Violence: Not on file    Allergies  Allergen Reactions   5-Alpha Reductase Inhibitors    Lyrica [Pregabalin]     hallucination   Motrin [Ibuprofen]     "dr said I can not take it."   Tramadol     Hallucinations, "legs jumping", increased appetite    Current Outpatient Medications  Medication Sig Dispense Refill   acetaminophen (TYLENOL) 500 MG tablet Take 1,000 mg by mouth every 6 (six) hours as needed (pain).      amLODipine (NORVASC) 5 MG tablet Take 5 mg by mouth daily.     atorvastatin (LIPITOR) 40 MG tablet Take 40 mg by mouth at bedtime.      cholecalciferol (VITAMIN D) 25 MCG (1000 UNIT) tablet Take 1,000 Units by mouth daily.     clopidogrel (PLAVIX) 75 MG tablet Take 75 mg by mouth daily.     fluticasone (FLONASE) 50 MCG/ACT nasal spray Place 1 spray into both nostrils daily.     levETIRAcetam (KEPPRA) 250 MG tablet Take 1 tablet (250 mg total) by mouth 2 (two) times daily. 180 tablet 3   losartan (COZAAR) 25 MG tablet Take 25 mg by mouth daily.     metFORMIN (GLUCOPHAGE-XR) 500 MG 24 hr tablet Take 1,000 mg by  mouth 2 (two) times a day.      nitroGLYCERIN (NITROSTAT) 0.4 MG SL tablet Place 1 tablet (0.4 mg total) under the tongue every 5 (five) minutes x 3 doses as needed for chest pain (if no relief after 2nd dose, proceed to the ED for an evaluation or call 911). 25 tablet 3   tiZANidine (ZANAFLEX) 2 MG tablet 1-2 tabs at night PRN 60 tablet 2   vitamin B-12 (CYANOCOBALAMIN) 1000 MCG tablet Take 1,000 mcg by mouth daily.     ZETIA 10 MG tablet Take 10 mg by mouth daily.     No current facility-administered medications for this visit.    PHYSICAL EXAM There were no vitals filed for this visit.   Constitutional: Chronically  ill-appearing gentleman in no acute distress. Neurologic: Significant left-sided weakness in the upper and lower extremity. Cardiac: Regular rate and rhythm Respiratory: Unlabored breathing Abdominal:  soft, non-tender, non-distended.  Peripheral vascular: 2+ radial pulses  PERTINENT LABORATORY AND RADIOLOGIC DATA  Most recent CBC    Latest Ref Rng & Units 01/23/2020    1:27 PM 07/09/2019   11:03 AM 06/14/2019    6:28 AM  CBC  WBC 4.0 - 10.5 K/uL 7.8  8.0  6.5   Hemoglobin 13.0 - 17.0 g/dL 14.8  14.8  12.7   Hematocrit 39.0 - 52.0 % 45.5  44.3  39.3   Platelets 150 - 400 K/uL 237  208  170      Most recent CMP    Latest Ref Rng & Units 01/23/2020    1:27 PM 07/09/2019   11:03 AM 06/14/2019    6:28 AM  CMP  Glucose 70 - 99 mg/dL 92  135  126   BUN 8 - 23 mg/dL '15  16  11   '$ Creatinine 0.61 - 1.24 mg/dL 1.14  0.78  0.74   Sodium 135 - 145 mmol/L 142  139  141   Potassium 3.5 - 5.1 mmol/L 3.8  3.6  3.6   Chloride 98 - 111 mmol/L 108  108  110   CO2 22 - 32 mmol/L '24  21  25   '$ Calcium 8.9 - 10.3 mg/dL 8.8  9.3  8.0     Renal function CrCl cannot be calculated (Patient's most recent lab result is older than the maximum 21 days allowed.).  Hgb A1c MFr Bld (%)  Date Value  01/23/2020 5.5    LDL Cholesterol  Date Value Ref Range Status  07/09/2010 (H) 0 -  99 mg/dL Final   127        Total Cholesterol/HDL:CHD Risk Coronary Heart Disease Risk Table                     Men   Women  1/2 Average Risk   3.4   3.3  Average Risk       5.0   4.4  2 X Average Risk   9.6   7.1  3 X Average Risk  23.4   11.0        Use the calculated Patient Ratio above and the CHD Risk Table to determine the patient's CHD Risk.        ATP III CLASSIFICATION (LDL):  <100     mg/dL   Optimal  100-129  mg/dL   Near or Above                    Optimal  130-159  mg/dL   Borderline  160-189  mg/dL   High  >190     mg/dL   Very High     Vascular Imaging:  Carotid duplex 02/25/22 Right Carotid: Evidence consistent with a total occlusion of the right  ICA.   Left Carotid: Velocities in the left ICA are consistent with a 60-79%  stenosis.                Progression of stenosis when compared to prior study.   Vertebrals:  Bilateral vertebral arteries demonstrate antegrade flow.  Subclavians: Normal flow hemodynamics were seen in bilateral subclavian               arteries.   Arthur Aline. Stanford Breed, MD [facility]  Number: [phone] 09/26/2022 8:50 PM  Total time spent on preparing this encounter including chart review, data review, collecting history, examining the patient, coordinating care for this new patient, 60 minutes.  Portions of this report may have been transcribed using voice recognition software.  Every effort has been made to ensure accuracy; however, inadvertent computerized transcription errors may still be present.

## 2022-09-27 ENCOUNTER — Ambulatory Visit (HOSPITAL_COMMUNITY)
Admission: RE | Admit: 2022-09-27 | Discharge: 2022-09-27 | Disposition: A | Discharge status: Home / Self Care | Payer: Medicare PPO | Source: Ambulatory Visit | Attending: Vascular Surgery | Admitting: Vascular Surgery

## 2022-09-27 ENCOUNTER — Ambulatory Visit (INDEPENDENT_AMBULATORY_CARE_PROVIDER_SITE_OTHER): Payer: Medicare PPO | Admitting: Vascular Surgery

## 2022-09-27 DIAGNOSIS — I6522 Occlusion and stenosis of left carotid artery: Secondary | ICD-10-CM | POA: Diagnosis not present

## 2022-09-27 DIAGNOSIS — I6523 Occlusion and stenosis of bilateral carotid arteries: Secondary | ICD-10-CM

## 2022-09-27 DIAGNOSIS — I6521 Occlusion and stenosis of right carotid artery: Secondary | ICD-10-CM | POA: Diagnosis not present

## 2022-10-11 ENCOUNTER — Ambulatory Visit: Payer: Medicare PPO | Admitting: Vascular Surgery

## 2022-10-11 ENCOUNTER — Encounter: Payer: Self-pay | Admitting: Vascular Surgery

## 2022-10-11 VITALS — BP 138/77 | HR 62 | Temp 98.3°F | Resp 20 | Ht 69.0 in | Wt 216.0 lb

## 2022-10-11 DIAGNOSIS — I6523 Occlusion and stenosis of bilateral carotid arteries: Secondary | ICD-10-CM | POA: Diagnosis not present

## 2022-10-11 NOTE — Progress Notes (Signed)
[facility]   ASSESSMENT / PLAN: Arthur Cisneros. is a 66 y.o. male with occluded right internal carotid artery asymptomatic left 60-79% carotid artery stenosis. The patient should continue best medical therapy for carotid artery stenosis including: Complete cessation from all tobacco products. Blood glucose control with goal A1c < 7%. Blood pressure control with goal blood pressure < 140/90 mmHg. Lipid reduction therapy with goal LDL-C <100 mg/dL (<70 if symptomatic from carotid artery stenosis).   Clopidogrel '75mg'$  PO QD. Atorvastatin 40-'80mg'$  PO QD (or other "high intensity" statin therapy).  Patient is not yet a candidate for asymptomatic intervention on his left internal carotid artery stenosis.  I counseled him that should he develop symptoms attributable to this carotid lesion (i.e. temporary ischemic attack, or stroke) we would reconsider intervention.  I think a TCAR would be best for him should he reach the asymptomatic threshold (80% or greater stenosis), or if his stenosis becomes symptomatic.  He is understanding.  I will see him back in 6 months with a repeat carotid duplex.  CHIEF COMPLAINT: Carotid artery stenosis and occlusion on recent duplex  HISTORY OF PRESENT ILLNESS: Arthur Cisneros. is a 66 y.o. 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  The patient is in good spirits and can converse easily despite some aphasia.  He reports no new neurologic symptoms including visual changes, facial droop, weakness in the upper or lower extremities.  We had a long talk about the natural history of carotid artery stenosis, the rationale for therapy, the threshold for asymptomatic and symptomatic interventions, and the rationale for withholding intervention until stroke risk becomes higher.  10/11/22: Doing well overall.  No new focal neurologic  symptoms.  We reviewed his carotid duplex today  Past Medical History:  Diagnosis Date   Carotid artery disease ([place])    Coronary artery disease    Multivessel disease status post CABG in 2000 at [facility]   Depression    Essential hypertension    Hyperlipidemia    Nephrolithiasis    Prostate cancer ([place]) 2016   Status post XRT   Stroke [facility]) 2011   Right MCA infarct   Type 2 diabetes mellitus ([place])     Past Surgical History:  Procedure Laterality Date   CORONARY ARTERY BYPASS GRAFT     4 graft   CYSTOSCOPY WITH STENT PLACEMENT Right 06/11/2019   Procedure: CYSTOSCOPY WITH STENT PLACEMENT;  Surgeon: Lucas Mallow, MD;  Location: [facility];  Service: Urology;  Laterality: Right;   CYSTOSCOPY/URETEROSCOPY/HOLMIUM LASER/STENT PLACEMENT Right 06/06/2019   Procedure: CYSTOSCOPY/URETEROSCOPY/HOLMIUM LASER/STENT PLACEMENT;  Surgeon: Raynelle Bring, MD;  [facility];  Service: Urology;  Laterality: Right;   CYSTOSCOPY/URETEROSCOPY/HOLMIUM LASER/STENT PLACEMENT Right 07/15/2019   Procedure: CYSTOSCOPY/URETEROSCOPY/STENT PLACEMENT;  Surgeon: Raynelle Bring, MD;  [facility];  Service: Urology;  Laterality: Right;   CYSTOSCOPY/URETEROSCOPY/HOLMIUM LASER/STENT PLACEMENT Bilateral 01/30/2020   Procedure: CYSTOSCOPY/RETROGRADE/URETEROSCOPY /RIGHT STENT PLACEMENT;  Surgeon: Raynelle Bring, MD;  [facility];  Service: Urology;  Laterality: Bilateral;   PROSTATE BIOPSY     right hand surgery      Family History  Problem Relation Age of Onset   Heart disease Mother    Diabetes Mother    Arthritis Mother    Heart disease Father    Diabetes Father    Stroke Father    Stroke Other    Gout Other  Coronary artery disease Other     Social History   Socioeconomic History   Marital status: Married    Spouse name: [name]   Number of children: 5   Years of education: 8th    Highest education level: Not on file  Occupational History    Employer: RETIRED  Tobacco Use    Smoking status: Former    Packs/day: 0.25    Years: 15.00    Total pack years: 3.75    Types: Cigarettes    Start date: 10/19/1970    Quit date: 10/19/1985    Years since quitting: 37.0   Smokeless tobacco: Current    Types: Chew  Vaping Use   Vaping Use: Never used  Substance and Sexual Activity   Alcohol use: Not Currently    Alcohol/week: 0.0 standard drinks of alcohol   Drug use: No   Sexual activity: Not Currently  Other Topics Concern   Not on file  Social History Narrative   Patient lives at home with wife [name].    Patient has 5 children.    Patient has a 8th grade.    Patient is retired.    Patient is on disability.    Patient is right handed.    Patient drinks caffeine every once in a while.    Social Determinants of Health   Financial Resource Strain: Not on file  Food Insecurity: Not on file  Transportation Needs: Not on file  Physical Activity: Not on file  Stress: Not on file  Social Connections: Not on file  Intimate Partner Violence: Not on file    Allergies  Allergen Reactions   5-Alpha Reductase Inhibitors    Lyrica [Pregabalin]     hallucination   Motrin [Ibuprofen]     "dr said I can not take it."   Tramadol     Hallucinations, "legs jumping", increased appetite    Current Outpatient Medications  Medication Sig Dispense Refill   acetaminophen (TYLENOL) 500 MG tablet Take 1,000 mg by mouth every 6 (six) hours as needed (pain).      amLODipine (NORVASC) 5 MG tablet Take 5 mg by mouth daily.     atorvastatin (LIPITOR) 40 MG tablet Take 40 mg by mouth at bedtime.      cholecalciferol (VITAMIN D) 25 MCG (1000 UNIT) tablet Take 1,000 Units by mouth daily.     clopidogrel (PLAVIX) 75 MG tablet Take 75 mg by mouth daily.     fluticasone (FLONASE) 50 MCG/ACT nasal spray Place 1 spray into both nostrils daily.     levETIRAcetam (KEPPRA) 250 MG tablet Take 1 tablet (250 mg total) by mouth 2 (two) times daily. 180 tablet 3   losartan (COZAAR) 25 MG  tablet Take 25 mg by mouth daily.     metFORMIN (GLUCOPHAGE-XR) 500 MG 24 hr tablet Take 1,000 mg by mouth 2 (two) times a day.      nitroGLYCERIN (NITROSTAT) 0.4 MG SL tablet Place 1 tablet (0.4 mg total) under the tongue every 5 (five) minutes x 3 doses as needed for chest pain (if no relief after 2nd dose, proceed to the ED for an evaluation or call 911). 25 tablet 3   tiZANidine (ZANAFLEX) 2 MG tablet 1-2 tabs at night PRN 60 tablet 2   vitamin B-12 (CYANOCOBALAMIN) 1000 MCG tablet Take 1,000 mcg by mouth daily.     ZETIA 10 MG tablet Take 10 mg by mouth daily.     No current facility-administered medications for this visit.  PHYSICAL EXAM Vitals:   10/11/22 1412  BP: 138/77  Pulse: 62  Resp: 20  Temp: 98.3 F (36.8 C)  SpO2: 94%  Weight: 216 lb (98 kg)  Height: '5\' 9"'$  (1.753 m)    Constitutional: Chronically ill-appearing gentleman in no acute distress. Neurologic: Significant left-sided weakness in the upper and lower extremity. Cardiac: Regular rate and rhythm Respiratory: Unlabored breathing Abdominal:  soft, non-tender, non-distended.  Peripheral vascular: 2+ radial pulses  PERTINENT LABORATORY AND RADIOLOGIC DATA  Most recent CBC    Latest Ref Rng & Units 01/23/2020    1:27 PM 07/09/2019   11:03 AM 06/14/2019    6:28 AM  CBC  WBC 4.0 - 10.5 K/uL 7.8  8.0  6.5   Hemoglobin 13.0 - 17.0 g/dL 14.8  14.8  12.7   Hematocrit 39.0 - 52.0 % 45.5  44.3  39.3   Platelets 150 - 400 K/uL 237  208  170      Most recent CMP    Latest Ref Rng & Units 01/23/2020    1:27 PM 07/09/2019   11:03 AM 06/14/2019    6:28 AM  CMP  Glucose 70 - 99 mg/dL 92  135  126   BUN 8 - 23 mg/dL '15  16  11   '$ Creatinine 0.61 - 1.24 mg/dL 1.14  0.78  0.74   Sodium 135 - 145 mmol/L 142  139  141   Potassium 3.5 - 5.1 mmol/L 3.8  3.6  3.6   Chloride 98 - 111 mmol/L 108  108  110   CO2 22 - 32 mmol/L '24  21  25   '$ Calcium 8.9 - 10.3 mg/dL 8.8  9.3  8.0     Renal function CrCl cannot be  calculated (Patient's most recent lab result is older than the maximum 21 days allowed.).  Hgb A1c MFr Bld (%)  Date Value  01/23/2020 5.5    LDL Cholesterol  Date Value Ref Range Status  07/09/2010 (H) 0 - 99 mg/dL Final   127        Total Cholesterol/HDL:CHD Risk Coronary Heart Disease Risk Table                     Men   Women  1/2 Average Risk   3.4   3.3  Average Risk       5.0   4.4  2 X Average Risk   9.6   7.1  3 X Average Risk  23.4   11.0        Use the calculated Patient Ratio above and the CHD Risk Table to determine the patient's CHD Risk.        ATP III CLASSIFICATION (LDL):  <100     mg/dL   Optimal  100-129  mg/dL   Near or Above                    Optimal  130-159  mg/dL   Borderline  160-189  mg/dL   High  >190     mg/dL   Very High     Vascular Imaging:  Carotid duplex 02/25/22 Right Carotid: Evidence consistent with a total occlusion of the right  ICA.   Left Carotid: Velocities in the left ICA are consistent with a 60-79%  stenosis.                Progression of stenosis when compared to prior study.   Vertebrals:  Bilateral vertebral arteries  demonstrate antegrade flow.  Subclavians: Normal flow hemodynamics were seen in bilateral subclavian               arteries.   Carotid duplex 10/11/2022 Occluded right carotid artery redemonstrated 60 to 79% left carotid artery stenosis  Arthur Cisneros. Stanford Breed, MD [facility] Phone Number: [phone] 10/11/2022 2:16 PM  Total time spent on preparing this encounter including chart review, data review, collecting history, examining the patient, coordinating care for this established patient, 20 minutes  Portions of this report may have been transcribed using voice recognition software.  Every effort has been made to ensure accuracy; however, inadvertent computerized transcription errors may still be present.

## 2022-10-19 ENCOUNTER — Other Ambulatory Visit: Payer: Self-pay

## 2022-10-19 DIAGNOSIS — I6523 Occlusion and stenosis of bilateral carotid arteries: Secondary | ICD-10-CM

## 2023-01-21 ENCOUNTER — Other Ambulatory Visit: Payer: Self-pay | Admitting: Adult Health

## 2023-01-28 ENCOUNTER — Other Ambulatory Visit: Payer: Self-pay | Admitting: Adult Health

## 2023-02-01 NOTE — Progress Notes (Unsigned)
[facility]  PATIENT: Arthur Cisneros. DOB: May 03, 1956   REASON FOR VISIT: Follow-up for history of stroke and post stroke seizure HISTORY FROM: Patient  Chief complaint: No chief complaint on file.     HISTORY OF PRESENT ILLNESS:  Arthur Cisneros. is a 67 y.o. 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  Denies new or worsening stroke/TIA symptoms for seizure activity.  Residual deficits stable.  Continued use of tizanidine nightly with benefit.  Ambulates with cane and AFO brace, denies any recent falls.  Compliant on Plavix and atorvastatin.  Compliant on Keppra 250 mg twice daily.  Tolerating medications well.  Blood pressure well-controlled.  Routinely follows with PCP.  Repeat carotid ultrasound showed worsening left ICA stenosis therefore referred to vascular surgery who did not feel surgical intervention needed as he remains asymptomatic and currently continuing with medical management, plans on repeat carotid duplex around 03/2023.    Update 01/27/2022 JM: 67 year old male who returns for 1 year follow-up for seizure and stroke follow-up accompanied by his wife.  Overall stable without new or worsening stroke/TIA symptoms or seizure activity.  Left spastic hemiparesis and dysarthria stable.  Use of tizanidine 2mg  tab nightly with benefit.  Use of cane and AFO brace, denies any recent falls.  Compliant on Plavix and atorvastatin, denies side effects.  Blood pressure today 145/82.  Occasionally monitors at home which has been stable.  Compliant on Keppra 250 mg twice daily, denies side effects.  Reports routine lab work by PCP which has been satisfactory, unable to view via epic.  No new concerns at this time.       REVIEW OF SYSTEMS: Full  14 system review of systems performed and notable only for those listed, all others are neg: Weakness, speech difficulty, pain and all other systems negative  ALLERGIES: Allergies  Allergen Reactions   5-Alpha Reductase Inhibitors    Lyrica [Pregabalin]     hallucination   Motrin [Ibuprofen]     "dr said I can not take it."   Tramadol     Hallucinations, "legs jumping", increased appetite    HOME MEDICATIONS: Outpatient Medications Prior to Visit  Medication Sig Dispense Refill   acetaminophen (TYLENOL) 500 MG tablet Take 1,000 mg by mouth every 6 (six) hours as needed (pain).      amLODipine (NORVASC) 5 MG tablet Take 5 mg by mouth daily.     atorvastatin (LIPITOR) 40 MG tablet Take 40 mg by mouth at bedtime.      cholecalciferol (VITAMIN D) 25 MCG (1000 UNIT) tablet Take 1,000 Units by mouth daily.     clopidogrel (PLAVIX) 75 MG tablet Take 75 mg by mouth daily.     fluticasone (FLONASE) 50 MCG/ACT nasal spray Place 1 spray into both nostrils daily.     levETIRAcetam (KEPPRA) 250 MG tablet TAKE 1 TABLET BY MOUTH TWICE DAILY 180 tablet 1   losartan (COZAAR) 25 MG tablet Take 25 mg by mouth daily.     metFORMIN (GLUCOPHAGE-XR) 500 MG 24 hr tablet Take 1,000 mg by mouth 2 (two) times a day.      nitroGLYCERIN (NITROSTAT) 0.4 MG SL tablet Place 1 tablet (0.4 mg total) under the tongue every 5 (five) minutes x 3 doses as needed for chest  pain (if no relief after 2nd dose, proceed to the ED for an evaluation or call 911). 25 tablet 3   tiZANidine (ZANAFLEX) 2 MG tablet TAKE 1 OR 2 TABLETS BY MOUTH AT BEDTIME AS NEEDED 30 tablet 0   vitamin B-12 (CYANOCOBALAMIN) 1000 MCG tablet Take 1,000 mcg by mouth daily.     ZETIA 10 MG tablet Take 10 mg by mouth daily.     No facility-administered medications prior to visit.    PAST MEDICAL HISTORY: Past Medical History:  Diagnosis Date   Carotid artery disease ([place])    Coronary artery disease    Multivessel disease status post CABG in 2000 at  [facility]   Depression    Essential hypertension    Hyperlipidemia    Nephrolithiasis    Prostate cancer ([place]) 2016   Status post XRT   Stroke [facility]) 2011   Right MCA infarct   Type 2 diabetes mellitus ([place])     PAST SURGICAL HISTORY: Past Surgical History:  Procedure Laterality Date   CORONARY ARTERY BYPASS GRAFT     4 graft   CYSTOSCOPY WITH STENT PLACEMENT Right 06/11/2019   Procedure: CYSTOSCOPY WITH STENT PLACEMENT;  Surgeon: Lucas Mallow, MD;  Location: [facility];  Service: Urology;  Laterality: Right;   CYSTOSCOPY/URETEROSCOPY/HOLMIUM LASER/STENT PLACEMENT Right 06/06/2019   Procedure: CYSTOSCOPY/URETEROSCOPY/HOLMIUM LASER/STENT PLACEMENT;  Surgeon: Raynelle Bring, MD;  [facility];  Service: Urology;  Laterality: Right;   CYSTOSCOPY/URETEROSCOPY/HOLMIUM LASER/STENT PLACEMENT Right 07/15/2019   Procedure: CYSTOSCOPY/URETEROSCOPY/STENT PLACEMENT;  Surgeon: Raynelle Bring, MD;  [facility];  Service: Urology;  Laterality: Right;   CYSTOSCOPY/URETEROSCOPY/HOLMIUM LASER/STENT PLACEMENT Bilateral 01/30/2020   Procedure: CYSTOSCOPY/RETROGRADE/URETEROSCOPY /RIGHT STENT PLACEMENT;  Surgeon: Raynelle Bring, MD;  [facility];  Service: Urology;  Laterality: Bilateral;   PROSTATE BIOPSY     right hand surgery      FAMILY HISTORY: Family History  Problem Relation Age of Onset   Heart disease Mother    Diabetes Mother    Arthritis Mother    Heart disease Father    Diabetes Father    Stroke Father    Stroke Other    Gout Other    Coronary artery disease Other     SOCIAL HISTORY: Social History   Socioeconomic History   Marital status: Married    Spouse name: [name]   Number of children: 5   Years of education: 8th    Highest education level: Not on file  Occupational History    Employer: RETIRED  Tobacco Use   Smoking status: Former    Packs/day: 0.25    Years: 15.00    Additional pack years: 0.00    Total pack years: 3.75    Types: Cigarettes     Start date: 10/19/1970    Quit date: 10/19/1985    Years since quitting: 37.3   Smokeless tobacco: Current    Types: Chew  Vaping Use   Vaping Use: Never used  Substance and Sexual Activity   Alcohol use: Not Currently    Alcohol/week: 0.0 standard drinks of alcohol   Drug use: No   Sexual activity: Not Currently  Other Topics Concern   Not on file  Social History Narrative   Patient lives at home with wife [name].    Patient has 5 children.    Patient has a 8th grade.    Patient is retired.    Patient is on disability.    Patient is right handed.    Patient drinks  caffeine every once in a while.    Social Determinants of Health   Financial Resource Strain: Not on file  Food Insecurity: Not on file  Transportation Needs: Not on file  Physical Activity: Not on file  Stress: Not on file  Social Connections: Not on file  Intimate Partner Violence: Not on file     PHYSICAL EXAM  There were no vitals filed for this visit.   There is no height or weight on file to calculate BMI.  General: well developed, well nourished, pleasant middle aged 41 male, seated, in no evident distress Head: head normocephalic and atraumatic.   Neck: supple with no carotid or supraclavicular bruits Cardiovascular: regular rate and rhythm, no murmurs Musculoskeletal: no deformity Skin:  no rash/petichiae Vascular:  Normal pulses all extremities   Neurologic Exam Mental Status: Awake and fully alert.   Mild dysarthria.  Oriented to place and time. Recent and remote memory intact. Attention span, concentration and fund of knowledge appropriate. Mood and affect appropriate.  Cranial Nerves: Fundoscopic exam reveals sharp disc margins. Pupils equal, briskly reactive to light. Extraocular movements full without nystagmus. Visual fields full to confrontation. Hearing intact. Facial sensation intact. Left lower facial weakness.  Motor:  LUE: 3-4/5 deltoid and bicep. 2/5 tricep; no grip strength  with increased tone throughout.  Limited shoulder ROM  LLE: 4+/5 proximal with foot drop and use of AFO brace Full strength right upper and lower extremity Sensory.: intact to touch , pinprick , position and vibratory sensation.  Coordination: Rapid alternating movements normal in all extremities except limited on left side. Finger-to-nose and heel-to-shin performed accurately on right side Gait and Station: Arises from chair without difficulty. Stance is normal. Gait demonstrates  hemiplegic gait with use of cane and AFO brace Reflexes: 1+ and symmetric. Toes downgoing.      DIAGNOSTIC DATA (LABS, IMAGING, TESTING) - I reviewed patient records, labs, notes, testing and imaging myself where available.  Lab Results  Component Value Date   WBC 7.8 01/23/2020   HGB 14.8 01/23/2020   HCT 45.5 01/23/2020   MCV 95.6 01/23/2020   PLT 237 01/23/2020      Component Value Date/Time   NA 142 01/23/2020 1327   K 3.8 01/23/2020 1327   CL 108 01/23/2020 1327   CO2 24 01/23/2020 1327   GLUCOSE 92 01/23/2020 1327   BUN 15 01/23/2020 1327   CREATININE 1.14 01/23/2020 1327   CALCIUM 8.8 (L) 01/23/2020 1327   PROT 6.1 (L) 06/11/2019 0738   ALBUMIN 3.2 (L) 06/11/2019 0738   AST 21 06/11/2019 0738   ALT 14 06/11/2019 0738   ALKPHOS 30 (L) 06/11/2019 0738   BILITOT 1.0 06/11/2019 0738   GFRNONAA >60 01/23/2020 1327   GFRAA >60 01/23/2020 1327      ASSESSMENT AND PLAN 67 y.o. year old male  has a past medical history of Hyperlipidemia; Hypertension; Stroke;  Diabetes mellitus; Coronary artery disease; Anxiety; and Prostate cancer (05-2015). And seizure disorder in excellent control on Keppra.  Residual stroke deficits of left spastic hemiparesis and dysarthria   Seizure disorder -No additional seizure events -Continue Keppra 250 mg twice daily for seizure prophylaxis -refill provided -Discussed avoidance of seizure provoking triggers -reports routine lab work including CMP and CBC by PCP    Hx of stroke -Residual spastic hemiparesis -use of tizanidine 2mg  1-2 tabs at night as needed for spasticity -Continue Plavix and atorvastatin for secondary stroke prevention -Discussed secondary stroke prevention measures and importance of close PCP follow-up  for aggressive stroke risk factor management -Maintain strict control of hypertension with blood pressure goal below 130/90, lipids with LDL cholesterol goal below 70 mg percent and diabetes with hemoglobin A1c goal below 7%.  -Reports recent lab work by PCP which was satisfactory  B/l carotid artery stenosis -Carotid ultrasound 09/2022 left ICA 60 to 79% stenosis and right ICA occlusion -2020 and 2022 CUS:  left ICA 1 to 39% stenosis and right ICA occlusion -Followed by vascular surgery -plan for continued medical management and repeat carotid duplex around 03/2023 for surveillance monitoring    Follow-up in 1 year or call earlier if needed  CC:  [employer]    I spent 34 minutes of face-to-face and non-face-to-face time with patient and wife.  This included previsit chart review, lab review, study review, order entry, electronic health record documentation, patient education and discussion regarding above diagnoses and treatment plan and answered all other questions to patient's satisfaction   Frann Rider, [facility]  [facility] [address] [place], [place] [address]  Phone [phone] Fax [phone] Note: This document was prepared with digital dictation and possible smart phrase technology. Any transcriptional errors that result from this process are unintentional.

## 2023-02-02 ENCOUNTER — Ambulatory Visit: Payer: Medicare PPO | Admitting: Adult Health

## 2023-02-02 ENCOUNTER — Encounter: Payer: Self-pay | Admitting: Adult Health

## 2023-02-02 VITALS — BP 137/86 | HR 68 | Ht 69.0 in | Wt 221.0 lb

## 2023-02-02 DIAGNOSIS — G40909 Epilepsy, unspecified, not intractable, without status epilepticus: Secondary | ICD-10-CM | POA: Diagnosis not present

## 2023-02-02 DIAGNOSIS — Z8673 Personal history of transient ischemic attack (TIA), and cerebral infarction without residual deficits: Secondary | ICD-10-CM | POA: Diagnosis not present

## 2023-02-02 MED ORDER — LEVETIRACETAM 250 MG PO TABS: 250.0000 mg | ORAL_TABLET | Freq: Two times a day (BID) | ORAL | 3 refills | Status: DC

## 2023-02-02 MED ORDER — TIZANIDINE HCL 2 MG PO TABS: ORAL_TABLET | ORAL | 3 refills | Status: DC

## 2023-02-02 NOTE — Patient Instructions (Addendum)
Continue Keppra 250mg  twice daily for seizure prevention  Continue tizanidine at night to help with spasms   Continue clopidogrel 75 mg daily  and atorvastatin  for secondary stroke prevention  Continue to follow up with PCP regarding cholesterol, blood pressure and diabetes management  Maintain strict control of hypertension with blood pressure goal below 130/90, diabetes with hemoglobin A1c goal below 7.0 % and cholesterol with LDL cholesterol (bad cholesterol) goal below 70 mg/dL.   Signs of a Stroke? Follow the BEFAST method:  Balance Watch for a sudden loss of balance, trouble with coordination or vertigo Eyes Is there a sudden loss of vision in one or both eyes? Or double vision?  Face: Ask the person to smile. Does one side of the face droop or is it numb?  Arms: Ask the person to raise both arms. Does one arm drift downward? Is there weakness or numbness of a leg? Speech: Ask the person to repeat a simple phrase. Does the speech sound slurred/strange? Is the person confused ? Time: If you observe any of these signs, call 911.     Followup in the future with me in 1 year or call earlier if needed      Thank you for coming to see [place] at [facility]. I hope we have been able to provide you high quality care today.  You may receive a patient satisfaction survey over the next few weeks. We would appreciate your feedback and comments so that we may continue to improve ourselves and the health of our patients.

## 2023-04-18 ENCOUNTER — Encounter (HOSPITAL_COMMUNITY): Payer: Medicare PPO

## 2023-04-18 ENCOUNTER — Ambulatory Visit: Payer: Medicare PPO | Admitting: Vascular Surgery

## 2023-05-02 ENCOUNTER — Ambulatory Visit: Payer: Medicare PPO | Admitting: Vascular Surgery

## 2023-05-02 ENCOUNTER — Encounter: Payer: Self-pay | Admitting: Vascular Surgery

## 2023-05-02 ENCOUNTER — Ambulatory Visit (HOSPITAL_COMMUNITY)
Admission: RE | Admit: 2023-05-02 | Discharge: 2023-05-02 | Disposition: A | Discharge status: Home / Self Care | Payer: Medicare PPO | Source: Ambulatory Visit | Attending: Vascular Surgery | Admitting: Vascular Surgery

## 2023-05-02 VITALS — BP 144/84 | HR 66 | Temp 97.9°F | Wt 218.0 lb

## 2023-05-02 DIAGNOSIS — I6523 Occlusion and stenosis of bilateral carotid arteries: Secondary | ICD-10-CM | POA: Insufficient documentation

## 2023-05-02 DIAGNOSIS — I6522 Occlusion and stenosis of left carotid artery: Secondary | ICD-10-CM

## 2023-05-02 DIAGNOSIS — R221 Localized swelling, mass and lump, neck: Secondary | ICD-10-CM

## 2023-05-02 DIAGNOSIS — I6521 Occlusion and stenosis of right carotid artery: Secondary | ICD-10-CM

## 2023-05-02 NOTE — Progress Notes (Signed)
[facility] [place]  ASSESSMENT / PLAN: Arthur Cisneros. is a 67 y.o. male with occluded right internal carotid artery asymptomatic left 60-79% carotid artery stenosis. The patient should continue best medical therapy for carotid artery stenosis including: Complete cessation from all tobacco products. Blood glucose control with goal A1c < 7%. Blood pressure control with goal blood pressure < 140/90 mmHg. Lipid reduction therapy with goal LDL-C <100 mg/dL (<16 if symptomatic from carotid artery stenosis).   Clopidogrel 75mg  PO QD. Atorvastatin 40-80mg  PO QD (or other "high intensity" statin therapy).  Overall, his stenosis appears milder. He has a cystic mass which appears to have changed on serial ultrasounds. Previously this had the appearance of a lymph node to me. It is roughly the same size, but has some worrisome features (e.g. vascularity, irregular borders). I will obtain a CT angiogram of the head and neck to better evaluate. I will call him with the result.  CHIEF COMPLAINT: Carotid artery stenosis and occlusion on recent duplex  HISTORY OF PRESENT ILLNESS: Arthur Cisneros. is a 67 y.o. 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  The patient is in good spirits and can converse easily despite some aphasia.  He reports no new neurologic symptoms including visual changes, facial droop, weakness in the upper or lower extremities.  We had a long talk about the natural history of carotid artery stenosis, the rationale for therapy, the threshold for asymptomatic and symptomatic interventions, and the rationale for withholding intervention until stroke risk becomes higher.  10/11/22: Doing well overall.  No new focal neurologic symptoms.  We reviewed his carotid duplex today  05/02/23: Doing well. No neurologic complaints. I reviewed the abnormality on the  duplex scan with the patient today. I reviewed the rationale for advanced imaging.   Past Medical History:  Diagnosis Date   Carotid artery disease ([facility])    Coronary artery disease    Multivessel disease status post CABG in 2000 at [facility]   Depression    Essential hypertension    Hyperlipidemia    Nephrolithiasis    Prostate cancer ([facility]) 2016   Status post XRT   Stroke [facility]) 2011   Right MCA infarct   Type 2 diabetes mellitus ([facility])     Past Surgical History:  Procedure Laterality Date   CORONARY ARTERY BYPASS GRAFT     4 graft   CYSTOSCOPY WITH STENT PLACEMENT Right 06/11/2019   Procedure: CYSTOSCOPY WITH STENT PLACEMENT;  Surgeon: Crista Elliot, MD;  Location: [facility];  Service: Urology;  Laterality: Right;   CYSTOSCOPY/URETEROSCOPY/HOLMIUM LASER/STENT PLACEMENT Right 06/06/2019   Procedure: CYSTOSCOPY/URETEROSCOPY/HOLMIUM LASER/STENT PLACEMENT;  Surgeon: Heloise Purpura, MD;  [facility];  Service: Urology;  Laterality: Right;   CYSTOSCOPY/URETEROSCOPY/HOLMIUM LASER/STENT PLACEMENT Right 07/15/2019   Procedure: CYSTOSCOPY/URETEROSCOPY/STENT PLACEMENT;  Surgeon: Heloise Purpura, MD;  [facility];  Service: Urology;  Laterality: Right;   CYSTOSCOPY/URETEROSCOPY/HOLMIUM LASER/STENT PLACEMENT Bilateral 01/30/2020   Procedure: CYSTOSCOPY/RETROGRADE/URETEROSCOPY /RIGHT STENT PLACEMENT;  Surgeon: Heloise Purpura, MD;  [facility];  Service: Urology;  Laterality: Bilateral;   PROSTATE BIOPSY     right hand surgery      Family History  Problem Relation Age of Onset   Heart disease Mother    Diabetes Mother    Arthritis Mother    Heart disease Father    Diabetes Father    Stroke Father  Stroke Other    Gout Other    Coronary artery disease Other     Social History   Socioeconomic History   Marital status: Married    Spouse name: [name]   Number of children: 5   Years of education: 8th    Highest education level: Not on file  Occupational History     Employer: RETIRED  Tobacco Use   Smoking status: Former    Packs/day: 0.25    Years: 15.00    Additional pack years: 0.00    Total pack years: 3.75    Types: Cigarettes    Start date: 10/19/1970    Quit date: 10/19/1985    Years since quitting: 37.5   Smokeless tobacco: Current    Types: Chew  Vaping Use   Vaping Use: Never used  Substance and Sexual Activity   Alcohol use: Not Currently    Alcohol/week: 0.0 standard drinks of alcohol   Drug use: No   Sexual activity: Not Currently  Other Topics Concern   Not on file  Social History Narrative   Patient lives at home with wife Gigi Gin.    Patient has 5 children.    Patient has a 8th grade.    Patient is retired.    Patient is on disability.    Patient is right handed.    Patient drinks caffeine every once in a while.    Social Determinants of Health   Financial Resource Strain: Not on file  Food Insecurity: Not on file  Transportation Needs: Not on file  Physical Activity: Not on file  Stress: Not on file  Social Connections: Not on file  Intimate Partner Violence: Not on file    Allergies  Allergen Reactions   5-Alpha Reductase Inhibitors    Lyrica [Pregabalin]     hallucination   Motrin [Ibuprofen]     "dr said I can not take it."   Tramadol     Hallucinations, "legs jumping", increased appetite    Current Outpatient Medications  Medication Sig Dispense Refill   acetaminophen (TYLENOL) 500 MG tablet Take 1,000 mg by mouth every 6 (six) hours as needed (pain).      amLODipine (NORVASC) 5 MG tablet Take 5 mg by mouth daily.     atorvastatin (LIPITOR) 40 MG tablet Take 40 mg by mouth at bedtime.      cholecalciferol (VITAMIN D) 25 MCG (1000 UNIT) tablet Take 1,000 Units by mouth daily.     clopidogrel (PLAVIX) 75 MG tablet Take 75 mg by mouth daily.     fluticasone (FLONASE) 50 MCG/ACT nasal spray Place 1 spray into both nostrils daily.     levETIRAcetam (KEPPRA) 250 MG tablet Take 1 tablet (250 mg total) by  mouth 2 (two) times daily. 180 tablet 3   losartan (COZAAR) 25 MG tablet Take 25 mg by mouth daily.     metFORMIN (GLUCOPHAGE-XR) 500 MG 24 hr tablet Take 1,000 mg by mouth 2 (two) times a day.      nitroGLYCERIN (NITROSTAT) 0.4 MG SL tablet Place 1 tablet (0.4 mg total) under the tongue every 5 (five) minutes x 3 doses as needed for chest pain (if no relief after 2nd dose, proceed to the ED for an evaluation or call 911). 25 tablet 3   tiZANidine (ZANAFLEX) 2 MG tablet TAKE 1 OR 2 TABLETS BY MOUTH AT BEDTIME AS NEEDED 90 tablet 3   vitamin B-12 (CYANOCOBALAMIN) 1000 MCG tablet Take 1,000 mcg by mouth daily.  ZETIA 10 MG tablet Take 10 mg by mouth daily.     No current facility-administered medications for this visit.    PHYSICAL EXAM Vitals:   05/02/23 1513 05/02/23 1514  BP: (!) 137/90 (!) 144/84  Pulse: 66   Temp: 97.9 F (36.6 C)   TempSrc: Temporal   SpO2: 95%   Weight: 218 lb (98.9 kg)     Constitutional: Chronically ill-appearing gentleman in no acute distress. Neurologic: Significant left-sided weakness in the upper and lower extremity. Cardiac: Regular rate and rhythm Respiratory: Unlabored breathing Abdominal:  soft, non-tender, non-distended.  Peripheral vascular: 2+ radial pulses  PERTINENT LABORATORY AND RADIOLOGIC DATA  Most recent CBC    Latest Ref Rng & Units 01/23/2020    1:27 PM 07/09/2019   11:03 AM 06/14/2019    6:28 AM  CBC  WBC 4.0 - 10.5 K/uL 7.8  8.0  6.5   Hemoglobin 13.0 - 17.0 g/dL 16.1  09.6  04.5   Hematocrit 39.0 - 52.0 % 45.5  44.3  39.3   Platelets 150 - 400 K/uL 237  208  170      Most recent CMP    Latest Ref Rng & Units 01/23/2020    1:27 PM 07/09/2019   11:03 AM 06/14/2019    6:28 AM  CMP  Glucose 70 - 99 mg/dL 92  409  811   BUN 8 - 23 mg/dL 15  16  11    Creatinine 0.61 - 1.24 mg/dL 9.14  7.82  9.56   Sodium 135 - 145 mmol/L 142  139  141   Potassium 3.5 - 5.1 mmol/L 3.8  3.6  3.6   Chloride 98 - 111 mmol/L 108  108  110    CO2 22 - 32 mmol/L 24  21  25    Calcium 8.9 - 10.3 mg/dL 8.8  9.3  8.0     Renal function CrCl cannot be calculated (Patient's most recent lab result is older than the maximum 21 days allowed.).  Hgb A1c MFr Bld (%)  Date Value  01/23/2020 5.5    LDL Cholesterol  Date Value Ref Range Status  07/09/2010 (H) 0 - 99 mg/dL Final   213        Total Cholesterol/HDL:CHD Risk Coronary Heart Disease Risk Table                     Men   Women  1/2 Average Risk   3.4   3.3  Average Risk       5.0   4.4  2 X Average Risk   9.6   7.1  3 X Average Risk  23.4   11.0        Use the calculated Patient Ratio above and the CHD Risk Table to determine the patient's CHD Risk.        ATP III CLASSIFICATION (LDL):  <100     mg/dL   Optimal  086-578  mg/dL   Near or Above                    Optimal  130-159  mg/dL   Borderline  469-629  mg/dL   High  >528     mg/dL   Very High     Vascular Imaging: Right Carotid: Evidence consistent with a total occlusion of the right  ICA.                Non-hemodynamically significant plaque <50% noted in the  CCA.                Incidental finding Right neck complex cystic structure with                 irregular borders, and trace internal vascularity. This  structure                measures 2.7 x 2.0 x 2.7cm. Clinical correlation is  advised.   Left Carotid: Velocities in the left ICA are consistent with a 40-59%  stenosis.   Vertebrals: Bilateral vertebral arteries demonstrate antegrade flow.  Subclavians: Normal flow hemodynamics were seen in bilateral subclavian               arteries.   Rande Brunt. Lenell Antu, MD [facility] Phone Number: [phone] 05/02/2023 4:21 PM  Total time spent on preparing this encounter including chart review, data review, collecting history, examining the patient, coordinating care for this established patient, 20 minutes  Portions of this report may have been transcribed  using voice recognition software.  Every effort has been made to ensure accuracy; however, inadvertent computerized transcription errors may still be present.

## 2023-05-05 ENCOUNTER — Other Ambulatory Visit: Payer: Self-pay

## 2023-05-05 DIAGNOSIS — R221 Localized swelling, mass and lump, neck: Secondary | ICD-10-CM

## 2023-05-05 DIAGNOSIS — I6521 Occlusion and stenosis of right carotid artery: Secondary | ICD-10-CM

## 2023-05-05 DIAGNOSIS — I6522 Occlusion and stenosis of left carotid artery: Secondary | ICD-10-CM

## 2023-06-01 ENCOUNTER — Ambulatory Visit: Payer: Medicare PPO | Admitting: Vascular Surgery

## 2023-07-06 ENCOUNTER — Ambulatory Visit (HOSPITAL_COMMUNITY)
Admission: RE | Admit: 2023-07-06 | Discharge: 2023-07-06 | Disposition: A | Discharge status: Home / Self Care | Payer: Medicare PPO | Source: Ambulatory Visit | Attending: Vascular Surgery | Admitting: Vascular Surgery

## 2023-07-06 DIAGNOSIS — R221 Localized swelling, mass and lump, neck: Secondary | ICD-10-CM | POA: Diagnosis present

## 2023-07-06 DIAGNOSIS — I6522 Occlusion and stenosis of left carotid artery: Secondary | ICD-10-CM | POA: Diagnosis present

## 2023-07-06 DIAGNOSIS — I6521 Occlusion and stenosis of right carotid artery: Secondary | ICD-10-CM | POA: Insufficient documentation

## 2023-07-06 LAB — POCT I-STAT CREATININE: Creatinine, Ser: 1.8 mg/dL — ABNORMAL HIGH (ref 0.61–1.24)

## 2023-07-06 MED ORDER — IOHEXOL 350 MG/ML SOLN
60.0000 mL | Freq: Once | INTRAVENOUS | Status: AC | PRN
  Administered 2023-07-06: 60 mL via INTRAVENOUS

## 2023-07-10 NOTE — Progress Notes (Unsigned)
[facility] [place]  ASSESSMENT / PLAN: Arthur Cisneros. is a 67 y.o. male with occluded right internal carotid artery asymptomatic left 65% carotid artery stenosis.  The patient should continue best medical therapy for carotid artery stenosis including: Complete cessation from all tobacco products. Blood glucose control with goal A1c < 7%. Blood pressure control with goal blood pressure < 140/90 mmHg. Lipid reduction therapy with goal LDL-C <100 mg/dL (<36 if symptomatic from carotid artery stenosis).  Clopidogrel 75mg  PO QD. Atorvastatin 40-80mg  PO QD (or other "high intensity" statin therapy).   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  I reviewed this with the patient.  We will make an urgent referral to ENT.  I will see him again in a year for surveillance of his carotid artery stenosis.  CHIEF COMPLAINT: Follow-up  HISTORY OF PRESENT ILLNESS: Arthur Cisneros. is a 67 y.o. male who returns to clinic for ongoing surveillance of carotid artery stenosis.  Most recently a duplex was performed which was concerning for malignant lymph node in the neck.  He had fairly stable carotid artery stenosis.  He has history of a remote stroke with left sided weakness.  He has no new neurologic symptoms.  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   Past Medical History:  Diagnosis Date   Carotid artery disease (HCC)    Coronary artery disease    Multivessel disease status post CABG in 2000 at [facility]   Depression    Essential hypertension    Hyperlipidemia    Nephrolithiasis    Prostate cancer (HCC) 2016   Status post XRT   Stroke [facility]) 2011   Right MCA infarct   Type 2 diabetes mellitus (HCC)     Past Surgical History:  Procedure Laterality Date   CORONARY ARTERY BYPASS GRAFT     4 graft    CYSTOSCOPY WITH STENT PLACEMENT Right 06/11/2019   Procedure: CYSTOSCOPY WITH STENT PLACEMENT;  Surgeon: Crista Elliot, MD;  Location: [facility];  Service: Urology;  Laterality: Right;   CYSTOSCOPY/URETEROSCOPY/HOLMIUM LASER/STENT PLACEMENT Right 06/06/2019   Procedure: CYSTOSCOPY/URETEROSCOPY/HOLMIUM LASER/STENT PLACEMENT;  Surgeon: Heloise Purpura, MD;  Location: [facility];  Service: Urology;  Laterality: Right;   CYSTOSCOPY/URETEROSCOPY/HOLMIUM LASER/STENT PLACEMENT Right 07/15/2019   Procedure: CYSTOSCOPY/URETEROSCOPY/STENT PLACEMENT;  Surgeon: Heloise Purpura, MD;  Location: [facility];  Service: Urology;  Laterality: Right;   CYSTOSCOPY/URETEROSCOPY/HOLMIUM LASER/STENT PLACEMENT Bilateral 01/30/2020   Procedure: CYSTOSCOPY/RETROGRADE/URETEROSCOPY /RIGHT STENT PLACEMENT;  Surgeon: Heloise Purpura, MD;  Location: [facility];  Service: Urology;  Laterality: Bilateral;   PROSTATE BIOPSY     right hand surgery      Family History  Problem Relation Age of Onset   Heart disease Mother    Diabetes Mother    Arthritis Mother    Heart disease Father    Diabetes Father    Stroke Father    Stroke Other    Gout Other    Coronary artery disease Other     Social History   Socioeconomic History   Marital status: Married    Spouse name: [name]   Number of children: 5   Years of education: 8th    Highest education level: Not on file  Occupational History    Employer: RETIRED  Tobacco Use   Smoking status: Former    Current packs/day: 0.00  Average packs/day: 0.3 packs/day for 15.0 years (3.8 ttl pk-yrs)    Types: Cigarettes    Start date: 10/19/1970    Quit date: 10/19/1985    Years since quitting: 37.7   Smokeless tobacco: Current    Types: Chew  Vaping Use   Vaping status: Never Used  Substance and Sexual Activity   Alcohol use: Not Currently    Alcohol/week: 0.0 standard drinks of alcohol   Drug use: No   Sexual activity: Not Currently  Other Topics Concern   Not on file  Social  History Narrative   Patient lives at home with wife Gigi Gin.    Patient has 5 children.    Patient has a 8th grade.    Patient is retired.    Patient is on disability.    Patient is right handed.    Patient drinks caffeine every once in a while.    Social Determinants of Health   Financial Resource Strain: Not on file  Food Insecurity: Not on file  Transportation Needs: Not on file  Physical Activity: Not on file  Stress: Not on file  Social Connections: Not on file  Intimate Partner Violence: Not on file    Allergies  Allergen Reactions   5-Alpha Reductase Inhibitors    Lyrica [Pregabalin]     hallucination   Motrin [Ibuprofen]     "dr said I can not take it."   Tramadol     Hallucinations, "legs jumping", increased appetite    Current Outpatient Medications  Medication Sig Dispense Refill   acetaminophen (TYLENOL) 500 MG tablet Take 1,000 mg by mouth every 6 (six) hours as needed (pain).      amLODipine (NORVASC) 5 MG tablet Take 5 mg by mouth daily.     atorvastatin (LIPITOR) 40 MG tablet Take 40 mg by mouth at bedtime.      cholecalciferol (VITAMIN D) 25 MCG (1000 UNIT) tablet Take 1,000 Units by mouth daily.     clopidogrel (PLAVIX) 75 MG tablet Take 75 mg by mouth daily.     fluticasone (FLONASE) 50 MCG/ACT nasal spray Place 1 spray into both nostrils daily.     levETIRAcetam (KEPPRA) 250 MG tablet Take 1 tablet (250 mg total) by mouth 2 (two) times daily. 180 tablet 3   losartan (COZAAR) 25 MG tablet Take 25 mg by mouth daily.     metFORMIN (GLUCOPHAGE-XR) 500 MG 24 hr tablet Take 1,000 mg by mouth 2 (two) times a day.      nitroGLYCERIN (NITROSTAT) 0.4 MG SL tablet Place 1 tablet (0.4 mg total) under the tongue every 5 (five) minutes x 3 doses as needed for chest pain (if no relief after 2nd dose, proceed to the ED for an evaluation or call 911). 25 tablet 3   tiZANidine (ZANAFLEX) 2 MG tablet TAKE 1 OR 2 TABLETS BY MOUTH AT BEDTIME AS NEEDED 90 tablet 3   vitamin  B-12 (CYANOCOBALAMIN) 1000 MCG tablet Take 1,000 mcg by mouth daily.     ZETIA 10 MG tablet Take 10 mg by mouth daily.     No current facility-administered medications for this visit.    PHYSICAL EXAM Vitals:   07/11/23 0953  BP: (!) 153/86  Pulse: 64  Resp: 20  Temp: 97.9 F (36.6 C)  SpO2: 93%  Weight: 215 lb (97.5 kg)  Height: 5\' 9"  (1.753 m)    Elderly man in no distress Regular rate and rhythm Unlabored breathing Left-sided weakness.   Able to ambulate with a  cane    PERTINENT LABORATORY AND RADIOLOGIC DATA  Most recent CBC    Latest Ref Rng & Units 01/23/2020    1:27 PM 07/09/2019   11:03 AM 06/14/2019    6:28 AM  CBC  WBC 4.0 - 10.5 K/uL 7.8  8.0  6.5   Hemoglobin 13.0 - 17.0 g/dL 78.2  95.6  21.3   Hematocrit 39.0 - 52.0 % 45.5  44.3  39.3   Platelets 150 - 400 K/uL 237  208  170      Most recent CMP    Latest Ref Rng & Units 07/06/2023    1:56 PM 01/23/2020    1:27 PM 07/09/2019   11:03 AM  CMP  Glucose 70 - 99 mg/dL  92  086   BUN 8 - 23 mg/dL  15  16   Creatinine 5.78 - 1.24 mg/dL 4.69  6.29  5.28   Sodium 135 - 145 mmol/L  142  139   Potassium 3.5 - 5.1 mmol/L  3.8  3.6   Chloride 98 - 111 mmol/L  108  108   CO2 22 - 32 mmol/L  24  21   Calcium 8.9 - 10.3 mg/dL  8.8  9.3    Hgb U1L MFr Bld (%)  Date Value  01/23/2020 5.5   LDL Cholesterol  Date Value Ref Range Status  07/09/2010 (H) 0 - 99 mg/dL Final   244        Total Cholesterol/HDL:CHD Risk Coronary Heart Disease Risk Table                     Men   Women  1/2 Average Risk   3.4   3.3  Average Risk       5.0   4.4  2 X Average Risk   9.6   7.1  3 X Average Risk  23.4   11.0        Use the calculated Patient Ratio above and the CHD Risk Table to determine the patient's CHD Risk.        ATP III CLASSIFICATION (LDL):  <100     mg/dL   Optimal  010-272  mg/dL   Near or Above                    Optimal  130-159  mg/dL   Borderline  536-644  mg/dL   High  >034     mg/dL   Very  High     Vascular Imaging: CT angiogram head / neck:  1. 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 Recommend ENT consultation, correlation with direct inspection, and follow-up CT neck and/or PET-CT to fully characterize. 2. 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 Right cavernous ICA is non-opacified, which is new relative to CTA from 2011, but likely chronic given poor flow void on 2012 MRI. Right ICA is reconstituted near the carotid terminus with opacified right M1 MCA and opacification of attenuated more distal right MCA vessels. 3. Approximately 65% stenosis of the left proximal ICA.  Rande Brunt. Lenell Antu, MD FACS [facility] Phone Number: [phone] 07/11/2023 11:45 AM   Total time spent on preparing this encounter including chart review, data review, collecting history, examining the patient, coordinating care for this established patient, 30 minutes.  Portions of this report may have been transcribed using voice recognition software.  Every effort  has been made to ensure accuracy; however, inadvertent computerized transcription errors may still be present.

## 2023-07-11 ENCOUNTER — Encounter: Payer: Self-pay | Admitting: Vascular Surgery

## 2023-07-11 ENCOUNTER — Ambulatory Visit: Payer: Medicare PPO | Admitting: Vascular Surgery

## 2023-07-11 VITALS — BP 153/86 | HR 64 | Temp 97.9°F | Resp 20 | Ht 69.0 in | Wt 215.0 lb

## 2023-07-11 DIAGNOSIS — I6523 Occlusion and stenosis of bilateral carotid arteries: Secondary | ICD-10-CM | POA: Diagnosis not present

## 2023-07-11 DIAGNOSIS — R221 Localized swelling, mass and lump, neck: Secondary | ICD-10-CM | POA: Diagnosis not present

## 2023-07-21 ENCOUNTER — Other Ambulatory Visit: Payer: Self-pay | Admitting: Cardiology

## 2023-07-26 ENCOUNTER — Other Ambulatory Visit (HOSPITAL_COMMUNITY): Payer: Self-pay | Admitting: Otolaryngology

## 2023-07-26 DIAGNOSIS — C099 Malignant neoplasm of tonsil, unspecified: Secondary | ICD-10-CM

## 2023-07-26 DIAGNOSIS — C770 Secondary and unspecified malignant neoplasm of lymph nodes of head, face and neck: Secondary | ICD-10-CM

## 2023-07-27 NOTE — Progress Notes (Signed)
Oncology Nurse Navigator Documentation   Placed introductory call to new referral patient Arthur Cisneros. I spoke with his daughter, Herbert Seta who arranges and transports him to appointments.  Introduced myself as the H&N oncology nurse navigator that works with Dr. Basilio Cairo and medical oncology to whom he has been referred by Dr. Jenne Pane. She confirmed understanding of referral. Briefly explained my role as his navigator, provided my contact information.  I informed her of his appointment to have the PET completed on 08/04/23. She is aware that she will receive phone call to schedule him with radiation and medical oncology after his PET scan date.  I encouraged her to call with questions/concerns as he moves forward with appts and procedures.   She verbalized understanding of information provided, expressed appreciation for my call.   Navigator Initial Assessment Employment Status: he is retired Currently on [employer] / STD: no Living Situation: he lives with his wife Support System: wife, children PCP: [place] [facility] PCD: unkown Financial Concerns: no Transportation Needs: no Sensory Deficits: no Chiropodist Needed:  no Ambulation Needs: no Psychosocial Needs:  no Concerns/Needs Understanding Cancer:  addressed/answered by navigator to best of ability Self-Expressed Needs: no   Tourist information centre manager, BSN, OCN Head & Neck Oncology Nurse Navigator [facility] Phone # [phone]  Fax # [phone]

## 2023-07-28 ENCOUNTER — Other Ambulatory Visit: Payer: Self-pay

## 2023-07-28 DIAGNOSIS — I6523 Occlusion and stenosis of bilateral carotid arteries: Secondary | ICD-10-CM

## 2023-07-28 NOTE — Progress Notes (Addendum)
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 Recommend ENT consultation, correlation with direct inspection, and follow-up CT neck and/or PET-CT to fully characterize. 2. 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 Right cavernous ICA is non-opacified, which is new relative to CTA from 2011, but likely chronic given poor flow void on 2012 MRI. Right ICA is reconstituted near the carotid terminus with opacified right M1 MCA and opacification of attenuated more distal right MCA vessels. 3. Approximately 65% stenosis of the left proximal ICA.  Patient presented ago with symptoms of:  right neck mass  Biopsies revealed: 07-25-23   Nutrition Status Yes No Comments  Weight changes? []  [x]  Has maintained a stable weight  Swallowing concerns? []  [x]    PEG? []  [x]     Referrals Yes No Comments  Social Work? []  [x]    Dentistry? [x]  []  Has false teeth  Swallowing therapy? [x]  []    Nutrition? [x]  []    Med/Onc? [x]  []  Has appt.    Safety Issues Yes No Comments  Prior radiation? [x]  []  For prostate cancer  Pacemaker/ICD? []  [x]    Possible current pregnancy? []  [x]    Is the patient on methotrexate? []  [x]     Tobacco/Marijuana/Snuff/ETOH use: He uses smokeless tobacco but denies smoking or significant alcohol use.  Past/Anticipated interventions by otolaryngology, if any:  07/20/2023 Sammuel Hines, MD  History of Present Illness The patient is a 67 year old male who presents for evaluation of a neck mass. He is accompanied by his wife and daughter.  He reports no throat pain, bleeding, or difficulty swallowing. His medical history includes a diagnosis of prostate cancer, for which he underwent radiation  therapy. A recent neck CT scan demonstrated abnormalities.  SOCIAL HISTORY He uses smokeless tobacco but denies smoking or significant alcohol use.  Physical Exam A firm, round mass is palpable in the right upper neck, about 2.5 cm. Smaller mass just posterior to it. Large, irregular mass visible in the right tonsil area.  Laryngoscopy Flexible Diagnostic  Date/Time: 07/20/2023 9:00 AM  Performed by: Sammuel Hines, MD Authorized by: Sammuel Hines, MD  Consent:  Consent obtained: Verbal Consent given by: Patient Risks discussed: Bleeding and pain Procedure details:  Indications: direct visualization of the upper aerodigestive tract  Meds administered: Afrin with lidocaine. Instrument: flexible fiberoptic laryngoscope  Scope location: right nare  Post-procedure details:  Patient tolerance of procedure: Tolerated well, no immediate complications Comments:  Irregular mass isolated to right tonsil region. Otherwise, unremarkable pharynx and larynx exam. General   Assessment & Plan 1. Right tonsil cancer with cervical metastasis. A CT scan performed on August 22 revealed enlarged lymph nodes in the right side of the neck, with the largest being approximately 2.6 cm. Additionally, a mass was noted on the right back of the tongue. These findings are concerning for a possible cancerous growth. Physical examination confirmed a firm mass in the right upper neck. A biopsy was performed to confirm the diagnosis and determine the specific type of cancer. The potential treatment options, including chemotherapy and radiation, were discussed. Radiation alone was noted to be insufficient for a cure. The possibility of HPV-related throat cancer, which responds better to treatment, was also discussed.  Results Imaging  CT scan shows enlarged lymph nodes in the right side of the neck, some with necrotic appearance. The largest area is about 2.6 cm in the right upper neck. A mass is also observed  on the right back of the tongue.    Past/Anticipated interventions by medical oncology, if any: Pt to see Dr. Marella Chimes on 08-10-23  Current Complaints / other details: Based on kidney and heart concerns how will that effect treatment. Wants to side effects and overall quality of life with treatment. Wants to know overall plan.   Vitals:   08/08/23 1032  BP: 134/82  Pulse: 66  Resp: 18  Temp: (!) 97.5 F (36.4 C)  SpO2: 94%

## 2023-08-04 ENCOUNTER — Ambulatory Visit
Admission: RE | Admit: 2023-08-04 | Discharge: 2023-08-04 | Disposition: A | Discharge status: Home / Self Care | Payer: Medicare PPO | Source: Ambulatory Visit | Attending: Otolaryngology | Admitting: Otolaryngology

## 2023-08-04 DIAGNOSIS — I700 Atherosclerosis of aorta: Secondary | ICD-10-CM | POA: Insufficient documentation

## 2023-08-04 DIAGNOSIS — N132 Hydronephrosis with renal and ureteral calculous obstruction: Secondary | ICD-10-CM | POA: Diagnosis not present

## 2023-08-04 DIAGNOSIS — C770 Secondary and unspecified malignant neoplasm of lymph nodes of head, face and neck: Secondary | ICD-10-CM | POA: Diagnosis not present

## 2023-08-04 DIAGNOSIS — I6523 Occlusion and stenosis of bilateral carotid arteries: Secondary | ICD-10-CM | POA: Insufficient documentation

## 2023-08-04 DIAGNOSIS — C099 Malignant neoplasm of tonsil, unspecified: Secondary | ICD-10-CM | POA: Diagnosis present

## 2023-08-04 LAB — GLUCOSE, CAPILLARY: Glucose-Capillary: 107 mg/dL — ABNORMAL HIGH (ref 70–99)

## 2023-08-04 MED ORDER — FLUDEOXYGLUCOSE F - 18 (FDG) INJECTION
11.1000 | Freq: Once | INTRAVENOUS | Status: AC | PRN
  Administered 2023-08-04: 11.67 via INTRAVENOUS

## 2023-08-07 NOTE — Progress Notes (Signed)
Radiation Oncology         [phone]) [phone] ________________________________  Initial Outpatient Consultation  Name: Arthur Cisneros. MRN: [id number]  Date: 08/08/2023  DOB: 10-09-1956  CC:Inc, [facility]  Christia Reading, MD   REFERRING PHYSICIAN: Christia Reading, MD  DIAGNOSIS:    ICD-10-CM   1. Malignant neoplasm of tonsillar fossa (HCC)  C09.0     2. 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 is a 67 y.o. male who is being seen today for consideration of radiation therapy in management of his recently diagnosed tonsillar cancer.   The patient is followed by Dr. Blase Mess at [facility] for surveillance of carotid artery stenosis. In this setting, he presented for CTA of the head and neck on 07/06/23 which incidentally revealed an asymmetric 2.9 cm soft tissue mass at the right tongue base/glossotonsillar sulcus concerning for malignancy, as well as multiple enlarged and necrotic appearing right sided cervical nodes metastatic lymphadenopathy.   Subsequently, he was referred to Dr. Jenne Pane at [facility] on 07/20/23 for further evaluation and management. Physical exam performed during this visit noted: a firm, round mass is palpable in the right upper neck measuring approximately 2.5 cm, a smaller mass just posterior to the 2.5 cm mass, and an isolated large irregular appearing right tonsillar mass. A laryngoscopy was also performed which redemonstrated the  tonsillar mass.   Biopsy of the right tonsillar mass obtained on 07/20/23 by Dr. Jenne Pane showed findings consistent with non-keratinizing squamous cell carcinoma; HPV-associated (p16 positive).   Pertinent imaging thus far includes a PET scan performed on 08/04/23 which demonstrated: a hypermetabolic right tonsillar mass with adjacent centrally necrotic level IIA lymph nodes with no distant metastatic disease identified. Progressive chronic severe right hydronephrosis and hydroureter with chronic obstructing 3 mm calculus in the distal right ureter was also seen. (Patient follows with Dr. Laverle Patter for these issues)  Swallowing issues, if any: none  Weight Changes: none  Pain status: none  Other symptoms: none  Tobacco history, if any: former smoker - quit 37 years ago, currently uses smokeless tobacco but denies current smoking   ETOH abuse, if any: drinks on occasion   Prior cancers, if any: prostate cancer (see below)  PREVIOUS RADIATION THERAPY: history of radiation for stage T1c adenocarcinoma of the prostate diagnosed in 2016 in noted on previous encounters, however there are no records of this available for review    PAST MEDICAL HISTORY:  has a past medical history of Carotid artery disease (HCC), Coronary artery disease, Depression, Essential hypertension, Hyperlipidemia, Nephrolithiasis, Prostate cancer (HCC) (2016), Stroke (HCC) (2011), and Type 2 diabetes mellitus (HCC).    PAST SURGICAL HISTORY: Past Surgical History:  Procedure Laterality Date   CORONARY ARTERY BYPASS GRAFT     4 graft   CYSTOSCOPY WITH STENT PLACEMENT Right 06/11/2019   Procedure: CYSTOSCOPY WITH STENT PLACEMENT;  Surgeon: Crista Elliot, MD;  Location: [facility];  Service: Urology;  Laterality: Right;   CYSTOSCOPY/URETEROSCOPY/HOLMIUM LASER/STENT PLACEMENT Right 06/06/2019   Procedure:  CYSTOSCOPY/URETEROSCOPY/HOLMIUM LASER/STENT PLACEMENT;  Surgeon: Heloise Purpura, MD;  Location: [facility];  Service: Urology;   Laterality: Right;   CYSTOSCOPY/URETEROSCOPY/HOLMIUM LASER/STENT PLACEMENT Right 07/15/2019   Procedure: CYSTOSCOPY/URETEROSCOPY/STENT PLACEMENT;  Surgeon: Heloise Purpura, MD;  Location: [facility];  Service: Urology;  Laterality: Right;   CYSTOSCOPY/URETEROSCOPY/HOLMIUM LASER/STENT PLACEMENT Bilateral 01/30/2020   Procedure: CYSTOSCOPY/RETROGRADE/URETEROSCOPY /RIGHT STENT PLACEMENT;  Surgeon: Heloise Purpura, MD;  Location: [facility];  Service: Urology;  Laterality: Bilateral;   PROSTATE BIOPSY     right hand surgery      FAMILY HISTORY: family history includes Arthritis in his mother; Coronary artery disease in an other family member; Diabetes in his father and mother; Gout in an other family member; Heart disease in his father and mother; Stroke in his father and another family member.  SOCIAL HISTORY:  reports that he quit smoking about 37 years ago. His smoking use included cigarettes. He started smoking about 52 years ago. He has a 3.8 pack-year smoking history. His smokeless tobacco use includes chew. He reports that he does not currently use alcohol. He reports that he does not use drugs.  ALLERGIES: 5-alpha reductase inhibitors, Lyrica [pregabalin], Motrin [ibuprofen], and Tramadol  MEDICATIONS:  Current Outpatient Medications  Medication Sig Dispense Refill   acetaminophen (TYLENOL) 500 MG tablet Take 1,000 mg by mouth every 6 (six) hours as needed (pain).      amLODipine (NORVASC) 5 MG tablet Take 5 mg by mouth daily.     atorvastatin (LIPITOR) 40 MG tablet Take 40 mg by mouth at bedtime.      cholecalciferol (VITAMIN D) 25 MCG (1000 UNIT) tablet Take 1,000 Units by mouth daily.     clopidogrel (PLAVIX) 75 MG tablet Take 75 mg by mouth daily.     fluticasone (FLONASE) 50 MCG/ACT nasal spray Place 1 spray into both nostrils daily.     levETIRAcetam (KEPPRA) 250 MG tablet Take 1 tablet (250 mg total) by mouth 2 (two) times daily. 180 tablet 3   losartan (COZAAR) 25 MG tablet Take 25 mg by  mouth daily.     metFORMIN (GLUCOPHAGE-XR) 500 MG 24 hr tablet Take 1,000 mg by mouth 2 (two) times a day.      nitroGLYCERIN (NITROSTAT) 0.4 MG SL tablet DISSOLVE (1) TABLET UNDER TONGUE AS NEEDED TO RELIEVE CHEST PAIN. MAY REPEAT EVERY 5 MINUTES. (if no relief after 2nd dose, proceed to the ED for an evaluation or call 911). 25 tablet 3   tiZANidine (ZANAFLEX) 2 MG tablet TAKE 1 OR 2 TABLETS BY MOUTH AT BEDTIME AS NEEDED 90 tablet 3   vitamin B-12 (CYANOCOBALAMIN) 1000 MCG tablet Take 1,000 mcg by mouth daily.     ZETIA 10 MG tablet Take 10 mg by mouth daily.     No current facility-administered medications for this encounter.    REVIEW OF SYSTEMS:  Notable for that above.   PHYSICAL EXAM:  height is 5\' 9"  (1.753 m) and weight is 221 lb 8 oz (100.5 kg). His temporal temperature is 97.5 F (36.4 C) (abnormal). His blood pressure is 134/82 and his pulse is 66. His respiration is 18 and oxygen saturation is 94%.   General: Alert and oriented, in no acute distress HEENT: Head is normocephalic. Extraocular movements are intact. Oropharynx is notable for large irregular mass in the right tonsil. Dentures in place.  Neck: Neck is notable for firm, palpable mass in the right upper neck with adjacent to a smaller mass.  Heart: Regular in rate and rhythm with no murmurs,  rubs, or gallops. Chest: Clear to auscultation bilaterally, with no rhonchi, wheezes, or rales. Abdomen: Soft, nontender, nondistended, with no rigidity or guarding. Extremities: No cyanosis or edema. Lymphatics: see Neck Exam Skin: No concerning lesions. Musculoskeletal: Patient has left sided leg and arm weakness with left foot drop.  Neurologic: Cranial nerves II through XII are grossly intact. No obvious focalities. Speech is fluent. Coordination is intact. Psychiatric: Judgment and insight are intact. Affect is appropriate.   ECOG = 0  0 - Asymptomatic (Fully active, able to carry on all predisease activities without  restriction)  1 - Symptomatic but completely ambulatory (Restricted in physically strenuous activity but ambulatory and able to carry out work of a light or sedentary nature. For example, light housework, office work)  2 - Symptomatic, <50% in bed during the day (Ambulatory and capable of all self care but unable to carry out any work activities. Up and about more than 50% of waking hours)  3 - Symptomatic, >50% in bed, but not bedbound (Capable of only limited self-care, confined to bed or chair 50% or more of waking hours)  4 - Bedbound (Completely disabled. Cannot carry on any self-care. Totally confined to bed or chair)  5 - Death   Santiago Glad MM, Creech RH, Tormey DC, et al. [phone]). "Toxicity and response criteria of the [facility]". Am. Evlyn Clines. Oncol. 5 (6): 649-55   LABORATORY DATA:  Lab Results  Component Value Date   WBC 7.8 01/23/2020   HGB 14.8 01/23/2020   HCT 45.5 01/23/2020   MCV 95.6 01/23/2020   PLT 237 01/23/2020   CMP     Component Value Date/Time   NA 142 01/23/2020 1327   K 3.8 01/23/2020 1327   CL 108 01/23/2020 1327   CO2 24 01/23/2020 1327   GLUCOSE 92 01/23/2020 1327   BUN 15 01/23/2020 1327   CREATININE 1.80 (H) 07/06/2023 1356   CALCIUM 8.8 (L) 01/23/2020 1327   PROT 6.1 (L) 06/11/2019 0738   ALBUMIN 3.2 (L) 06/11/2019 0738   AST 21 06/11/2019 0738   ALT 14 06/11/2019 0738   ALKPHOS 30 (L) 06/11/2019 0738   BILITOT 1.0 06/11/2019 0738   GFRNONAA >60 01/23/2020 1327   GFRAA >60 01/23/2020 1327      No results found for: "TSH"   RADIOGRAPHY: NM PET Image Initial (PI) Skull Base To Thigh (F-18 FDG)  Result Date: 08/08/2023 CLINICAL DATA:  Initial treatment strategy for right tonsillar cancer with nodal metastasis. History of stroke. EXAM: NUCLEAR MEDICINE PET SKULL BASE TO THIGH TECHNIQUE: 11.67 mCi F-18 FDG was injected intravenously. Full-ring PET imaging was performed from the skull base to thigh after the radiotracer. CT  data was obtained and used for attenuation correction and anatomic localization. Fasting blood glucose: 107 mg/dl COMPARISON:  CTA of the head and neck 07/06/2023. Abdominopelvic CT 11/20/2019. FINDINGS: Mediastinal blood pool activity: SUV max 3.0 NECK: Previously demonstrated right tonsillar mass is intensely hypermetabolic with an SUV max of 15.2. The tonsillar mass measures approximately 3.3 x 1.8 cm on image 26/6. There are adjacent centrally necrotic level IIA lymph nodes on the right with peripheral hypermetabolic activity. These include a 2.5 x 2.1 cm node on image 27/6 with an SUV max of 6.5 and a 2.3 x 1.6 cm node on the same image within SUV max of 5.4.No contralateral hypermetabolic lymph nodes identified. No other suspicious activity identified within the pharyngeal mucosal space. Incidental CT findings: Bilateral carotid atherosclerosis. Sequela of previous large right  MCA stroke. CHEST: There are no hypermetabolic mediastinal, hilar or axillary lymph nodes. No hypermetabolic pulmonary activity or suspicious nodularity. Incidental CT findings: Status post median sternotomy and CABG. Atherosclerosis of the aorta, great vessels and coronary arteries. The heart is mildly enlarged. ABDOMEN/PELVIS: There is no hypermetabolic activity within the liver, adrenal glands, spleen or pancreas. There is no hypermetabolic nodal activity in the abdomen or pelvis. Incidental CT findings: Progressive chronic severe right hydronephrosis and hydroureter with marked renal cortical thinning. No significant excretion of activity into the right collecting system demonstrated. There is a chronic obstructing 3 mm calculus in distal right ureter. Fiducial markers noted in the prostate gland. Multiple calcified gallstones, aortic and branch vessel atherosclerosis noted. SKELETON: There is no hypermetabolic activity to suggest osseous metastatic disease. Incidental CT findings: Mild spondylosis. IMPRESSION: 1. Hypermetabolic  right tonsillar mass with adjacent centrally necrotic level IIA lymph nodes consistent with known squamous cell carcinoma. 2. No distant metastatic disease identified. 3. Progressive chronic severe right hydronephrosis and hydroureter with chronic obstructing 3 mm calculus in the distal right ureter. The right kidney appears nonfunctional. 4.  Aortic Atherosclerosis (ICD10-I70.0). Electronically Signed   By: Carey Bullocks M.D.   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 I recommend radiotherapy for this patient. His PET scan shows extensive, limited stage disease to the right tonsil and surrounding lymph nodes. We personally spoke with Dr. Hezzie Bump today. The patient may be a candidate for TORS resection, so we will refer him to [facility] for evaluation. If he opts for surgery, adjuvant radiation is recommended. Alternatively, concurrent chemoradiation is an option. We will await consultation with ENT and review his case at our tumor board before finalizing the treatment plan.   Of note, the patient does have a history of right internal carotid artery occlusion and carotid artery stenosis. Because of this, we may consider sparing the left neck from prophylactic radiation. We will have the patient continue to follow-up with vascular surgery and may consider stenting as an alternative.   We discussed the potential risks, benefits, and side effects of radiotherapy. We talked in detail about acute and late effects. We discussed that some of the most bothersome acute effects may be mucositis, dysgeusia, salivary changes, skin irritation, hair loss, dehydration, weight loss and fatigue. We talked about late effects which include but are not necessarily limited to dysphagia, hypothyroidism, nerve  injury, vascular injury, spinal cord injury, xerostomia, trismus, neck edema, dental issues, non-healing wound, and potentially fatal injury to any of the tissues in the head and neck region. No guarantees of treatment were given. A consent form was signed and placed in the patient's medical record. The patient is enthusiastic about proceeding with treatment. I look forward to participating in the patient's care.    We also discussed that the treatment of head and neck cancer is a multidisciplinary process to maximize treatment outcomes and quality of life. For this reason the following referrals have been or will be made:   Medical oncology to discuss chemotherapy. Patient is scheduled to meet with Dr. Boyd Kerbs on 08/10/23.    Dental evaluation not needed, patient has dentures.    Nutritionist for nutrition support during and after treatment.   Speech language pathology for swallowing and/or speech therapy.   Social work for social support.  Physical therapy due to risk of lymphedema in neck and deconditioning.   Baseline labs including TSH.   As stated above, patient is following with Dr. Laverle Patter for his kidney disease.  On date of service, in total, I spent 60 minutes on this encounter. Patient was seen in person.  __________________________________________   Joyice Faster, PA-C   Lonie Peak, MD  This document serves as a record of services personally performed by Lonie Peak, MD. It was created on her behalf by Neena Rhymes, a trained medical scribe. The creation of this record is based on the scribe's personal observations and the provider's statements to them. This document has been checked and approved by the attending provider.

## 2023-08-08 ENCOUNTER — Ambulatory Visit
Admission: RE | Admit: 2023-08-08 | Discharge: 2023-08-08 | Disposition: A | Discharge status: Home / Self Care | Payer: Medicare PPO | Source: Ambulatory Visit | Attending: Radiation Oncology | Admitting: Radiation Oncology

## 2023-08-08 ENCOUNTER — Encounter: Payer: Self-pay | Admitting: Radiation Oncology

## 2023-08-08 VITALS — BP 134/82 | HR 66 | Temp 97.5°F | Resp 18 | Ht 69.0 in | Wt 221.5 lb

## 2023-08-08 DIAGNOSIS — Z79899 Other long term (current) drug therapy: Secondary | ICD-10-CM | POA: Diagnosis not present

## 2023-08-08 DIAGNOSIS — C090 Malignant neoplasm of tonsillar fossa: Secondary | ICD-10-CM | POA: Insufficient documentation

## 2023-08-08 DIAGNOSIS — Z8673 Personal history of transient ischemic attack (TIA), and cerebral infarction without residual deficits: Secondary | ICD-10-CM | POA: Diagnosis not present

## 2023-08-08 DIAGNOSIS — I6529 Occlusion and stenosis of unspecified carotid artery: Secondary | ICD-10-CM | POA: Insufficient documentation

## 2023-08-08 DIAGNOSIS — I700 Atherosclerosis of aorta: Secondary | ICD-10-CM | POA: Insufficient documentation

## 2023-08-08 DIAGNOSIS — E119 Type 2 diabetes mellitus without complications: Secondary | ICD-10-CM | POA: Insufficient documentation

## 2023-08-08 DIAGNOSIS — Z7984 Long term (current) use of oral hypoglycemic drugs: Secondary | ICD-10-CM | POA: Diagnosis not present

## 2023-08-08 DIAGNOSIS — E785 Hyperlipidemia, unspecified: Secondary | ICD-10-CM | POA: Diagnosis not present

## 2023-08-08 DIAGNOSIS — Z87891 Personal history of nicotine dependence: Secondary | ICD-10-CM | POA: Insufficient documentation

## 2023-08-08 DIAGNOSIS — N132 Hydronephrosis with renal and ureteral calculous obstruction: Secondary | ICD-10-CM | POA: Insufficient documentation

## 2023-08-08 DIAGNOSIS — I10 Essential (primary) hypertension: Secondary | ICD-10-CM | POA: Insufficient documentation

## 2023-08-08 DIAGNOSIS — I2510 Atherosclerotic heart disease of native coronary artery without angina pectoris: Secondary | ICD-10-CM | POA: Diagnosis not present

## 2023-08-08 DIAGNOSIS — I6523 Occlusion and stenosis of bilateral carotid arteries: Secondary | ICD-10-CM | POA: Diagnosis not present

## 2023-08-08 DIAGNOSIS — C61 Malignant neoplasm of prostate: Secondary | ICD-10-CM | POA: Diagnosis not present

## 2023-08-08 DIAGNOSIS — D490 Neoplasm of unspecified behavior of digestive system: Secondary | ICD-10-CM

## 2023-08-08 NOTE — Progress Notes (Signed)
Oncology Nurse Navigator Documentation   Met with patient during initial consult with Arthur Cisneros.  He was accompanied by his wife and daughter.  Further introduced myself as his/their Navigator, explained my role as a member of the Care Team. Provided New Patient resource guide binder: Contact information for physicians, this navigator, other members of the Care Team Advance Directive information; provided [facility] AD booklet at their request,  Fall Prevention Patient Safety Plan Financial Assistance Information sheet Symptom Management Clinic information [facility] map with highlight of [facility] SLP Information sheet Head and Neck cancer basics Nutrition information Patient and family support information including Spiritual care/Chaplain information, Peer mentor program, health and wellness classes, and the survivorship program Community resources  Provided and discussed educational handouts for PEG and PAC. Assisted with post-consult appt scheduling. I toured him to the [facility] treatment area, explained procedures for lobby registration, arrival to Radiation Waiting, and arrival to treatment area.    At Dr. Colletta Maryland request I have referred him to ENT at [facility] in [place]. He has been scheduled to see Dr. Hezzie Bump on 10/2 for surgical evaluation. I have spoken with his daughter and she is aware of this appointment.  They verbalized understanding of information provided. I encouraged them to call with questions/concerns moving forward.  Hedda Slade, RN, BSN, OCN Head & Neck Oncology Nurse Navigator [facility] [place] [phone]

## 2023-08-09 ENCOUNTER — Encounter: Payer: Self-pay | Admitting: Radiation Oncology

## 2023-08-10 ENCOUNTER — Inpatient Hospital Stay: Payer: Medicare PPO | Attending: Oncology

## 2023-08-10 ENCOUNTER — Inpatient Hospital Stay: Payer: Medicare PPO | Admitting: Oncology

## 2023-08-10 ENCOUNTER — Encounter: Payer: Self-pay | Admitting: Oncology

## 2023-08-10 VITALS — BP 154/92 | HR 63 | Temp 98.7°F | Resp 18 | Wt 225.2 lb

## 2023-08-10 DIAGNOSIS — Z79620 Long term (current) use of immunosuppressive biologic: Secondary | ICD-10-CM | POA: Diagnosis not present

## 2023-08-10 DIAGNOSIS — N134 Hydroureter: Secondary | ICD-10-CM | POA: Diagnosis not present

## 2023-08-10 DIAGNOSIS — I63239 Cerebral infarction due to unspecified occlusion or stenosis of unspecified carotid arteries: Secondary | ICD-10-CM | POA: Insufficient documentation

## 2023-08-10 DIAGNOSIS — C090 Malignant neoplasm of tonsillar fossa: Secondary | ICD-10-CM | POA: Insufficient documentation

## 2023-08-10 DIAGNOSIS — N189 Chronic kidney disease, unspecified: Secondary | ICD-10-CM | POA: Diagnosis not present

## 2023-08-10 DIAGNOSIS — I129 Hypertensive chronic kidney disease with stage 1 through stage 4 chronic kidney disease, or unspecified chronic kidney disease: Secondary | ICD-10-CM | POA: Diagnosis not present

## 2023-08-10 DIAGNOSIS — Z8546 Personal history of malignant neoplasm of prostate: Secondary | ICD-10-CM | POA: Insufficient documentation

## 2023-08-10 DIAGNOSIS — I2510 Atherosclerotic heart disease of native coronary artery without angina pectoris: Secondary | ICD-10-CM

## 2023-08-10 DIAGNOSIS — Z79899 Other long term (current) drug therapy: Secondary | ICD-10-CM | POA: Diagnosis not present

## 2023-08-10 DIAGNOSIS — Z87891 Personal history of nicotine dependence: Secondary | ICD-10-CM

## 2023-08-10 DIAGNOSIS — N1330 Unspecified hydronephrosis: Secondary | ICD-10-CM | POA: Diagnosis not present

## 2023-08-10 DIAGNOSIS — C61 Malignant neoplasm of prostate: Secondary | ICD-10-CM

## 2023-08-10 LAB — CBC WITH DIFFERENTIAL/PLATELET
Abs Immature Granulocytes: 0.03 10*3/uL (ref 0.00–0.07)
Basophils Absolute: 0.1 10*3/uL (ref 0.0–0.1)
Basophils Relative: 1 %
Eosinophils Absolute: 0.3 10*3/uL (ref 0.0–0.5)
Eosinophils Relative: 4 %
HCT: 44.6 % (ref 39.0–52.0)
Hemoglobin: 15.1 g/dL (ref 13.0–17.0)
Immature Granulocytes: 0 %
Lymphocytes Relative: 18 %
Lymphs Abs: 1.5 10*3/uL (ref 0.7–4.0)
MCH: 30.8 pg (ref 26.0–34.0)
MCHC: 33.9 g/dL (ref 30.0–36.0)
MCV: 90.8 fL (ref 80.0–100.0)
Monocytes Absolute: 0.7 10*3/uL (ref 0.1–1.0)
Monocytes Relative: 8 %
Neutro Abs: 6 10*3/uL (ref 1.7–7.7)
Neutrophils Relative %: 69 %
Platelets: 240 10*3/uL (ref 150–400)
RBC: 4.91 MIL/uL (ref 4.22–5.81)
RDW: 13.5 % (ref 11.5–15.5)
WBC: 8.5 10*3/uL (ref 4.0–10.5)
nRBC: 0 % (ref 0.0–0.2)

## 2023-08-10 LAB — COMPREHENSIVE METABOLIC PANEL
ALT: 23 U/L (ref 0–44)
AST: 14 U/L — ABNORMAL LOW (ref 15–41)
Albumin: 4.4 g/dL (ref 3.5–5.0)
Alkaline Phosphatase: 41 U/L (ref 38–126)
Anion gap: 8 (ref 5–15)
BUN: 20 mg/dL (ref 8–23)
CO2: 23 mmol/L (ref 22–32)
Calcium: 9.4 mg/dL (ref 8.9–10.3)
Chloride: 109 mmol/L (ref 98–111)
Creatinine, Ser: 1.23 mg/dL (ref 0.61–1.24)
GFR, Estimated: >60 mL/min (ref >60)
Glucose, Bld: 97 mg/dL (ref 70–99)
Potassium: 4.1 mmol/L (ref 3.5–5.1)
Sodium: 140 mmol/L (ref 135–145)
Total Bilirubin: 0.4 mg/dL (ref 0.3–1.2)
Total Protein: 7.3 g/dL (ref 6.5–8.1)

## 2023-08-10 LAB — TSH: TSH: 1.413 u[IU]/mL (ref 0.350–4.500)

## 2023-08-10 NOTE — Assessment & Plan Note (Signed)
Treated with radiation treatments in 2011 followed by ADT for few months.  Has not been on any treatments lately and remained on surveillance only

## 2023-08-10 NOTE — Progress Notes (Signed)
Oncology Nurse Navigator Documentation   Met with patient during initial consult with Dr. Arlana Pouch.  He was accompanied by his wife and daughter.  Further introduced myself as his/their Navigator, explained my role as a member of the Care Team. Assisted with post-consult appt scheduling. He will see Dr. Hezzie Bump at [facility] on 10/2. I will follow for his future treatment plan.  They verbalized understanding of information provided. I encouraged them to call with questions/concerns moving forward.  Hedda Slade, RN, BSN, OCN Head & Neck Oncology Nurse Navigator [facility] [place] [phone]

## 2023-08-10 NOTE — Progress Notes (Signed)
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 is a 67 y.o. gentleman with multiple medical comorbidities including coronary artery disease status post CABG, history of CVA with residual left-sided weakness, prostate cancer treated with radiation in 2011, history of kidney stones with complications of chronic right hydroureter and right hydronephrosis, carotid artery stenosis, hypertension, was referred to our clinic for recent diagnosis of squamous cell carcinoma of the right tonsil, p16 positive.  I have reviewed his chart and materials related to his cancer extensively and collaborated history with the patient. Summary of oncologic history is as follows:  Oncology History  Primary cancer of tonsillar fossa (HCC)  07/20/2023 Initial Diagnosis   He is followed by Dr. Blase Mess at [facility] for surveillance of carotid artery stenosis. In this setting, he presented for CTA of the head and neck on 07/06/23 which incidentally revealed an asymmetric 2.9 cm soft tissue mass at the right tongue base/glossotonsillar sulcus concerning for malignancy, as well as multiple enlarged and necrotic appearing right sided cervical nodes metastatic lymphadenopathy.    Subsequently, he was referred to Dr. Jenne Pane at [facility] on 07/20/23 for further evaluation and management. Physical exam performed during this visit noted: a firm, round mass is palpable  in the right upper neck measuring approximately 2.5 cm, a smaller mass just posterior to the 2.5 cm mass, and an isolated large irregular appearing right tonsillar mass. A laryngoscopy was also performed which redemonstrated the tonsillar mass.    Biopsy of the right tonsillar mass obtained on 07/20/23 by Dr. Jenne Pane showed findings consistent with non-keratinizing squamous cell carcinoma; HPV-associated (p16 positive).    07/20/2023 Procedure   Laryngoscopy and biopsy of right tonsillar mass by [facility]   07/20/2023 Pathology Results   Biopsy from the right tonsil showed nonkeratinizing squamous cell carcinoma, HPV associated (p16 positive).   08/04/2023 PET scan   A hypermetabolic right tonsillar mass with adjacent centrally necrotic level IIA lymph nodes with no distant metastatic disease identified. Progressive chronic severe right hydronephrosis and hydroureter with chronic obstructing 3 mm calculus in the distal right ureter was also seen. The right kidney appeared nonfunctional.    08/08/2023 Cancer Staging   Staging form: Pharynx - HPV-Mediated Oropharynx, AJCC 8th Edition - Clinical stage from 08/08/2023: Stage I (cT2, cN1, cM0, p16+) - Signed by Lonie Peak, MD on 08/09/2023 Stage prefix: Initial diagnosis     According to the patient, he has had every 59-month evaluation of his carotids with ultrasound.  In November 2023, ultrasound of the carotids showed possible lymph node enlargement but it was considered nonsignificant at that time per patient's verbal report.  He had follow-up ultrasound of the neck in June 2024 which led to CT scan of the neck in August 2024 as mentioned above in oncology history.  He denies any hearing deficit, difficulties with chewing food, swallowing difficulties, painful swallowing, changes in the quality of his voice or abnormal weight loss.   MEDICAL HISTORY:  Past Medical History:  Diagnosis Date  Carotid artery disease (HCC)    Coronary artery disease     Multivessel disease status post CABG in 2000 at [facility]   Depression    Essential hypertension    Hyperlipidemia    Nephrolithiasis    Prostate cancer (HCC) 2016   Status post XRT   Sepsis (HCC) 06/11/2019   Stroke [facility]) 2011   Right MCA infarct   Type 2 diabetes mellitus (HCC)     SURGICAL HISTORY: Past Surgical History:  Procedure Laterality Date   CORONARY ARTERY BYPASS GRAFT     4 graft   CYSTOSCOPY WITH STENT PLACEMENT Right 06/11/2019   Procedure: CYSTOSCOPY WITH STENT PLACEMENT;  Surgeon: Crista Elliot, MD;  Location: [facility];  Service: Urology;  Laterality: Right;   CYSTOSCOPY/URETEROSCOPY/HOLMIUM LASER/STENT PLACEMENT Right 06/06/2019   Procedure: CYSTOSCOPY/URETEROSCOPY/HOLMIUM LASER/STENT PLACEMENT;  Surgeon: Heloise Purpura, MD;  Location: [facility];  Service: Urology;  Laterality: Right;   CYSTOSCOPY/URETEROSCOPY/HOLMIUM LASER/STENT PLACEMENT Right 07/15/2019   Procedure: CYSTOSCOPY/URETEROSCOPY/STENT PLACEMENT;  Surgeon: Heloise Purpura, MD;  Location: [facility];  Service: Urology;  Laterality: Right;   CYSTOSCOPY/URETEROSCOPY/HOLMIUM LASER/STENT PLACEMENT Bilateral 01/30/2020   Procedure: CYSTOSCOPY/RETROGRADE/URETEROSCOPY /RIGHT STENT PLACEMENT;  Surgeon: Heloise Purpura, MD;  Location: [facility];  Service: Urology;  Laterality: Bilateral;   PROSTATE BIOPSY     right hand surgery      SOCIAL HISTORY: Social History   Socioeconomic History   Marital status: Married    Spouse name: Arthur Cisneros   Number of children: 5   Years of education: 8th    Highest education level: Not on file  Occupational History    Employer: RETIRED  Tobacco Use   Smoking status: Former    Current packs/day: 0.00    Average packs/day: 0.3 packs/day for 15.0 years (3.8 ttl pk-yrs)    Types: Cigarettes    Start date: 10/19/1970    Quit date: 10/19/1985    Years since quitting: 37.8   Smokeless tobacco: Current    Types: Chew  Vaping Use   Vaping status: Never Used  Substance and Sexual  Activity   Alcohol use: Not Currently    Alcohol/week: 0.0 standard drinks of alcohol   Drug use: No   Sexual activity: Not Currently  Other Topics Concern   Not on file  Social History Narrative   Patient lives at home with wife Arthur Cisneros.    Patient has 5 children.    Patient has a 8th grade.    Patient is retired.    Patient is on disability.    Patient is right handed.    Patient drinks caffeine every once in a while.    Social Determinants of Health   Financial Resource Strain: Not on file  Food Insecurity: Low Risk  (07/20/2023)   Received from [facility]   Hunger Vital Sign    Worried About Running Out of Food in the Last Year: Never true    Ran Out of Food in the Last Year: Never true  Transportation Needs: No Transportation Needs (07/20/2023)   Received from [facility]    In the past 12 months, has lack of reliable transportation kept you from medical appointments, meetings, work or from getting things needed for daily living? : No  Physical Activity: Not on file  Stress: Not on file  Social Connections: Not on file  Intimate Partner Violence: Not on file    FAMILY HISTORY: Family History  Problem Relation Age of Onset   Heart disease Mother  Diabetes Mother    Arthritis Mother    Heart disease Father    Diabetes Father    Stroke Father    Stroke Other    Gout Other    Coronary artery disease Other     ALLERGIES:  is allergic to 5-alpha reductase inhibitors, lyrica [pregabalin], motrin [ibuprofen], and tramadol.  MEDICATIONS:  Current Outpatient Medications  Medication Sig Dispense Refill   acetaminophen (TYLENOL) 500 MG tablet Take 1,000 mg by mouth every 6 (six) hours as needed (pain).      amLODipine (NORVASC) 5 MG tablet Take 5 mg by mouth daily.     atorvastatin (LIPITOR) 40 MG tablet Take 40 mg by mouth at bedtime.      cholecalciferol (VITAMIN D) 25 MCG (1000 UNIT) tablet Take 1,000 Units by mouth daily.     clopidogrel  (PLAVIX) 75 MG tablet Take 75 mg by mouth daily.     fluticasone (FLONASE) 50 MCG/ACT nasal spray Place 1 spray into both nostrils daily.     levETIRAcetam (KEPPRA) 250 MG tablet Take 1 tablet (250 mg total) by mouth 2 (two) times daily. 180 tablet 3   losartan (COZAAR) 25 MG tablet Take 25 mg by mouth daily.     metFORMIN (GLUCOPHAGE-XR) 500 MG 24 hr tablet Take 1,000 mg by mouth 2 (two) times a day.      nitroGLYCERIN (NITROSTAT) 0.4 MG SL tablet DISSOLVE (1) TABLET UNDER TONGUE AS NEEDED TO RELIEVE CHEST PAIN. MAY REPEAT EVERY 5 MINUTES. (if no relief after 2nd dose, proceed to the ED for an evaluation or call 911). 25 tablet 3   tiZANidine (ZANAFLEX) 2 MG tablet TAKE 1 OR 2 TABLETS BY MOUTH AT BEDTIME AS NEEDED 90 tablet 3   vitamin B-12 (CYANOCOBALAMIN) 1000 MCG tablet Take 1,000 mcg by mouth daily.     ZETIA 10 MG tablet Take 10 mg by mouth daily.     No current facility-administered medications for this visit.    REVIEW OF SYSTEMS:   Constitutional: Denies fevers, chills or abnormal night sweats Eyes: Denies blurriness of vision, double vision Ears, nose, mouth, throat, and face: Denies mucositis or sore throat Respiratory: Denies cough, dyspnea  Cardiovascular: Denies palpitation, chest discomfort  Neurological: chronic left sided weakness, stable Behavioral/Psych: Mood is stable, no new changes  All other pertinent systems were reviewed with the patient and are negative.  PHYSICAL EXAMINATION:  ECOG PERFORMANCE STATUS: 2 - Symptomatic, <50% confined to bed  Vitals:   08/10/23 0956  BP: (!) 154/92  Pulse: 63  Resp: 18  Temp: 98.7 F (37.1 C)  SpO2: 93%   Filed Weights   08/10/23 0956  Weight: 225 lb 3.2 oz (102.2 kg)    GENERAL:alert, in no distress and comfortable EYES: Normal, conjunctiva are pink and non-injected, sclera clear OROPHARYNX: Right sided enlarged tonsil noted on limited exam. Normal buccal mucosa, and tongue normal  NECK: Palpable right-sided  cervical lymphadenopathy noted LUNGS: clear to auscultation.  Normal breathing effort HEART: regular rate & rhythm ABDOMEN:abdomen soft, non-tender PSYCH: alert & oriented x 3 with fluent speech NEURO: Decreased strength in both left upper extremity and left lower extremity  LABORATORY DATA:  I have reviewed the data as listed  Recent Results (from the past 2160 hour(s))  I-STAT creatinine     Status: Abnormal   Collection Time: 07/06/23  1:56 PM  Result Value Ref Range   Creatinine, Ser 1.80 (H) 0.61 - 1.24 mg/dL  Glucose, capillary     Status: Abnormal  Collection Time: 08/04/23 12:58 PM  Result Value Ref Range   Glucose-Capillary 107 (H) 70 - 99 mg/dL    Comment: Glucose reference range applies only to samples taken after fasting for at least 8 hours.  Comprehensive metabolic panel     Status: Abnormal   Collection Time: 08/10/23 10:34 AM  Result Value Ref Range   Sodium 140 135 - 145 mmol/L   Potassium 4.1 3.5 - 5.1 mmol/L   Chloride 109 98 - 111 mmol/L   CO2 23 22 - 32 mmol/L   Glucose, Bld 97 70 - 99 mg/dL    Comment: Glucose reference range applies only to samples taken after fasting for at least 8 hours.   BUN 20 8 - 23 mg/dL   Creatinine, Ser 0.93 0.61 - 1.24 mg/dL   Calcium 9.4 8.9 - 23.5 mg/dL   Total Protein 7.3 6.5 - 8.1 g/dL   Albumin 4.4 3.5 - 5.0 g/dL   AST 14 (L) 15 - 41 U/L   ALT 23 0 - 44 U/L   Alkaline Phosphatase 41 38 - 126 U/L   Total Bilirubin 0.4 0.3 - 1.2 mg/dL   GFR, Estimated >57 >32 mL/min    Comment: (NOTE) Calculated using the CKD-EPI Creatinine Equation (2021)    Anion gap 8 5 - 15    Comment: Performed at [facility], [address], [place], [place] [address]  CBC with Differential/Platelet     Status: None   Collection Time: 08/10/23 10:34 AM  Result Value Ref Range   WBC 8.5 4.0 - 10.5 K/uL   RBC 4.91 4.22 - 5.81 MIL/uL   Hemoglobin 15.1 13.0 - 17.0 g/dL   HCT 27.0 62.3 - 76.2 %   MCV 90.8 80.0 - 100.0  fL   MCH 30.8 26.0 - 34.0 pg   MCHC 33.9 30.0 - 36.0 g/dL   RDW 83.1 51.7 - 61.6 %   Platelets 240 150 - 400 K/uL   nRBC 0.0 0.0 - 0.2 %   Neutrophils Relative % 69 %   Neutro Abs 6.0 1.7 - 7.7 K/uL   Lymphocytes Relative 18 %   Lymphs Abs 1.5 0.7 - 4.0 K/uL   Monocytes Relative 8 %   Monocytes Absolute 0.7 0.1 - 1.0 K/uL   Eosinophils Relative 4 %   Eosinophils Absolute 0.3 0.0 - 0.5 K/uL   Basophils Relative 1 %   Basophils Absolute 0.1 0.0 - 0.1 K/uL   Immature Granulocytes 0 %   Abs Immature Granulocytes 0.03 0.00 - 0.07 K/uL    Comment: Performed at [facility], [address], [place], [place] [address]     RADIOGRAPHIC STUDIES: I have personally reviewed the radiological images as listed and agreed with the findings in the report. NM PET Image Initial (PI) Skull Base To Thigh (F-18 FDG)  Result Date: 08/08/2023 CLINICAL DATA:  Initial treatment strategy for right tonsillar cancer with nodal metastasis. History of stroke. EXAM: NUCLEAR MEDICINE PET SKULL BASE TO THIGH TECHNIQUE: 11.67 mCi F-18 FDG was injected intravenously. Full-ring PET imaging was performed from the skull base to thigh after the radiotracer. CT data was obtained and used for attenuation correction and anatomic localization. Fasting blood glucose: 107 mg/dl COMPARISON:  CTA of the head and neck 07/06/2023. Abdominopelvic CT 11/20/2019. FINDINGS: Mediastinal blood pool activity: SUV max 3.0 NECK: Previously demonstrated right tonsillar mass is intensely hypermetabolic with an SUV max of 15.2. The tonsillar mass measures approximately 3.3 x 1.8  cm on image 26/6. There are adjacent centrally necrotic level IIA lymph nodes on the right with peripheral hypermetabolic activity. These include a 2.5 x 2.1 cm node on image 27/6 with an SUV max of 6.5 and a 2.3 x 1.6 cm node on the same image within SUV max of 5.4.No contralateral hypermetabolic lymph nodes identified. No other suspicious activity  identified within the pharyngeal mucosal space. Incidental CT findings: Bilateral carotid atherosclerosis. Sequela of previous large right MCA stroke. CHEST: There are no hypermetabolic mediastinal, hilar or axillary lymph nodes. No hypermetabolic pulmonary activity or suspicious nodularity. Incidental CT findings: Status post median sternotomy and CABG. Atherosclerosis of the aorta, great vessels and coronary arteries. The heart is mildly enlarged. ABDOMEN/PELVIS: There is no hypermetabolic activity within the liver, adrenal glands, spleen or pancreas. There is no hypermetabolic nodal activity in the abdomen or pelvis. Incidental CT findings: Progressive chronic severe right hydronephrosis and hydroureter with marked renal cortical thinning. No significant excretion of activity into the right collecting system demonstrated. There is a chronic obstructing 3 mm calculus in distal right ureter. Fiducial markers noted in the prostate gland. Multiple calcified gallstones, aortic and branch vessel atherosclerosis noted. SKELETON: There is no hypermetabolic activity to suggest osseous metastatic disease. Incidental CT findings: Mild spondylosis. IMPRESSION: 1. Hypermetabolic right tonsillar mass with adjacent centrally necrotic level IIA lymph nodes consistent with known squamous cell carcinoma. 2. No distant metastatic disease identified. 3. Progressive chronic severe right hydronephrosis and hydroureter with chronic obstructing 3 mm calculus in the distal right ureter. The right kidney appears nonfunctional. 4.  Aortic Atherosclerosis (ICD10-I70.0). Electronically Signed   By: Carey Bullocks M.D.   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  He has had consultation with radiation oncologist Dr. Basilio Cairo on 08/08/2023.  Discussed findings so far, including PET/CT results. PET scan shows disease  to the right tonsil and surrounding ipsilateral lymph nodes, cT2,cN1,M0 disease.   Dr.Squire spoke with Dr. Hezzie Bump. The patient may be a candidate for TORS resection, and hence he has been referred to [facility] for evaluation. If he opts for surgery, adjuvant radiation will be offered after surgery.  Reviewed NCCN guidelines and discussed treatment options.  In patients who are not candidates for surgical resection or who opt out of surgery, concurrent chemoradiation would be recommended.  However given his nonfunctional right kidney and CKD, cisplatin would be not an option for him.  Discussed alternative options of carboplatin/paclitaxel weekly during the course of radiation.  Carboplatin still carries risk of renal injury.  Other option of cetuximab was also discussed briefly but with his cardiac history, we would like to avoid that regimen.  After discussing pros and cons with each systemic option, patient and his family members who are accompanying (his daughter and his wife) decided to not proceed with systemic chemotherapy.  I agree with them.  Unfortunately his medical comorbidities preclude several chemotherapy options.  We are hoping that he would be a candidate for surgical resection.  This will be followed by adjuvant radiation.  If no surgery, he will receive definitive radiation therapy.  CBCD, CMP are grossly unremarkable today.  Creatinine 1.23.  TSH pending.  His case will be discussed in our multispecialty tumor conference next week for consensus opinion.  We will be available in the future in case of any systemic therapy needs.  Malignant neoplasm of prostate (HCC)  Treated with radiation treatments in 2011 followed by [facility]T for few months.  Has not been on any treatments lately and remained on surveillance only  Occlusion and stenosis of carotid artery with cerebral infarction Has chronic left-sided weakness.  Uses cane to walk at home.  Has inability to lift left upper  extremity above his shoulder.     Orders Placed This Encounter  Procedures   CBC with Differential/Platelet    Standing Status:   Future    Number of Occurrences:   1    Standing Expiration Date:   08/09/2024   Comprehensive metabolic panel    Standing Status:   Future    Number of Occurrences:   1    Standing Expiration Date:   08/09/2024   TSH    Standing Status:   Future    Number of Occurrences:   1    Standing Expiration Date:   08/09/2024    The total time spent in the appointment was 60 minutes encounter with the patient, including review of chart and results of various tests, discussion about plan of care and coordination of care plan.  I reviewed lab results and outside records for this visit and discussed relevant results with the patient and his family members who were accompanying. Diagnosis, plan of care and treatment options were also discussed in detail with the patient. Opportunity provided to ask questions and answers provided to his apparent satisfaction. Provided instructions to call our clinic with any problems, questions or concerns prior to return visit. I recommended to continue follow-up with PCP and sub-specialists. He verbalized understanding and agreed with the plan. No barriers to learning was detected.  NCCN guidelines have been consulted in the planning of this patient's care.  Future Appointments  Date Time Provider Department Center  08/11/2023  3:00 PM Jonelle Sidle, MD [facility]  02/08/2024 10:45 AM Ihor Austin, NP [facility] None     Meryl Crutch, MD 08/10/2023 3:35 PM   This document was completed utilizing speech recognition software. Grammatical errors, random word insertions, pronoun errors, and incomplete sentences are an occasional consequence of this system due to software limitations, ambient noise, and hardware issues. Any formal questions or concerns about the content, text or information contained within the body of this  dictation should be directly addressed to the provider for clarification.

## 2023-08-10 NOTE — Assessment & Plan Note (Signed)
Has chronic left-sided weakness.  Uses cane to walk at home.  Has inability to lift left upper extremity above his shoulder.

## 2023-08-10 NOTE — Assessment & Plan Note (Signed)
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  He has had consultation with radiation oncologist Dr. Basilio Cairo on 08/08/2023.  Discussed findings so far, including PET/CT results. PET scan shows disease to the right tonsil and surrounding ipsilateral lymph nodes, cT2,cN1,M0 disease.   Dr.Squire spoke with Dr. Hezzie Bump. The patient may be a candidate for TORS resection, and hence he has been referred to [facility] for evaluation. If he opts for surgery, adjuvant radiation will be offered after surgery.  Reviewed NCCN guidelines and discussed treatment options.  In patients who are not candidates for surgical resection or who opt out of surgery, concurrent chemoradiation would be recommended.  However given his nonfunctional right kidney and CKD, cisplatin would be not an option for him.  Discussed alternative options of carboplatin/paclitaxel weekly during the course of radiation.  Carboplatin still carries risk of renal injury.  Other option of cetuximab was also discussed briefly but with his cardiac history, we would like to avoid that regimen.  After discussing pros and cons with each systemic option, patient and his family members who are accompanying (his daughter and his wife) decided to not proceed with systemic chemotherapy.  I agree with them.  Unfortunately his medical comorbidities preclude several chemotherapy options.  We are hoping that he would be a candidate for surgical resection.  This will be followed by adjuvant radiation.  If no surgery, he will receive definitive radiation therapy.

## 2023-08-11 ENCOUNTER — Encounter: Payer: Self-pay | Admitting: Cardiology

## 2023-08-11 ENCOUNTER — Ambulatory Visit: Payer: Medicare PPO | Attending: Cardiology | Admitting: Cardiology

## 2023-08-11 VITALS — BP 126/80 | HR 63 | Ht 69.0 in | Wt 223.6 lb

## 2023-08-11 DIAGNOSIS — I25119 Atherosclerotic heart disease of native coronary artery with unspecified angina pectoris: Secondary | ICD-10-CM | POA: Diagnosis not present

## 2023-08-11 DIAGNOSIS — I6523 Occlusion and stenosis of bilateral carotid arteries: Secondary | ICD-10-CM

## 2023-08-11 DIAGNOSIS — Z01810 Encounter for preprocedural cardiovascular examination: Secondary | ICD-10-CM | POA: Diagnosis not present

## 2023-08-11 DIAGNOSIS — E782 Mixed hyperlipidemia: Secondary | ICD-10-CM

## 2023-08-11 NOTE — Patient Instructions (Addendum)

## 2023-08-11 NOTE — Progress Notes (Signed)
Cardiology Office Note  Date: 08/11/2023   ID: Arthur Jock., DOB 11-07-1956, MRN [id number]  History of Present Illness: Arthur Smrekar. is a 67 y.o. male last seen in August 2023.  He is here today with family member for follow-up visit.  From a cardiac perspective, he does not report any angina or nitroglycerin use with current level of activity, stable NYHA class II dyspnea, no palpitations or syncope.  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  Tumor was incidentally discovered with a carotid Doppler study initially.  I reviewed his medications.  Current regimen includes Plavix, Norvasc, Lipitor, Zetia, Cozaar, and as needed nitroglycerin.  LDL was 47 in June.  ECG today shows sinus rhythm with RSR' in lead V1 and old inferior infarct pattern.  Physical Exam: VS:  BP 126/80 (BP Location: Right Arm)   Pulse 63   Ht 5\' 9"  (1.753 m)   Wt 223 lb 9.6 oz (101.4 kg)   SpO2 96%   BMI 33.02 kg/m , BMI Body mass index is 33.02 kg/m.  Wt Readings from Last 3 Encounters:  08/11/23 223 lb 9.6 oz (101.4 kg)  08/10/23 225 lb 3.2 oz (102.2 kg)  08/08/23 221 lb 8 oz (100.5 kg)    General: Patient appears comfortable at rest. HEENT: Conjunctiva and lids normal. Neck: Supple, no elevated JVP. Lungs: Clear to auscultation, nonlabored breathing at rest. Cardiac: Regular rate and rhythm, no S3 or significant systolic murmur. Extremities: No pitting edema.  ECG:  An ECG dated 06/22/2022 was personally reviewed today and demonstrated:  Sinus rhythm with old inferior infarct pattern.  Labwork: June 2024: Cholesterol 101, triglycerides 125, HDL 31, LDL 47 08/10/2023: ALT 23; AST 14; BUN 20; Creatinine, Ser 1.23; Hemoglobin 15.1; Platelets 240; Potassium 4.1; Sodium 140; TSH 1.413   Other Studies  Reviewed Today:  No interval cardiac testing for review today.  Assessment and Plan:  1.  Multivessel CAD status post four-vessel CABG in 2000 at [facility].  Lexiscan Myoview in 2016 showed inferior/inferolateral scar without active ischemia and LVEF 65%.  He does not report any active angina at this time, ECG consistent with old inferior infarct pattern.  Plan to continue Plavix, Lipitor, Zetia and as needed nitroglycerin.  2.  Mixed hyperlipidemia.  LDL 47 in June.  Continue Lipitor and Zetia.  3.  Carotid artery disease, chronically occluded RICA and 65% proximal LICA stenosis by CTA in August per Dr. Lenell Cisneros.  He follows at [facility].  Symptomatically stable.  He has a prior history of stroke with chronic residua.  4.  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  He has further evaluation pending at [facility] regarding surgical management prior to considering radiation.  RCRI perioperative cardiac risk index is class III, intermediate risk, 6.6% chance of major adverse cardiac event.  He has not undergone any ischemic evaluation since 2016.  Given limited functional capacity plan would be to obtain an echocardiogram and a Lexiscan Myoview prior to surgery for evaluation of ischemic burden and LVEF.  These test can be arranged if he ends up being scheduled for operation.  Disposition:  Follow up  6 months.  Signed, Jonelle Sidle, M.D., F.A.C.C. [facility] at [facility]

## 2023-08-15 NOTE — Progress Notes (Signed)
Oncology Nurse Navigator Documentation   At Dr. Colletta Maryland request I called Mr. Mccauley urologist Dr. Vevelyn Royals office to have him evaluate Mr. Mejorado recent PET scan to determine if Dr. Laverle Patter needed to see him earlier than his scheduled visit in May 2025. I received a call back from his office today stating that he is aware of the PET results and in absence of pain or infection he does not need to see Mr. Baumgarten earlier than May 2025. I will notify Dr. Basilio Cairo.  Hedda Slade RN, BSN, OCN Head & Neck Oncology Nurse Navigator [facility] at [facility] Phone # [phone]  Fax # [phone]

## 2023-08-16 ENCOUNTER — Other Ambulatory Visit: Payer: Self-pay

## 2023-08-18 ENCOUNTER — Encounter: Payer: Self-pay | Admitting: Oncology

## 2023-08-18 ENCOUNTER — Inpatient Hospital Stay: Payer: Medicare PPO | Attending: Oncology | Admitting: Oncology

## 2023-08-18 VITALS — BP 153/82 | HR 66 | Temp 98.4°F | Resp 17 | Wt 224.5 lb

## 2023-08-18 DIAGNOSIS — Z87891 Personal history of nicotine dependence: Secondary | ICD-10-CM | POA: Insufficient documentation

## 2023-08-18 DIAGNOSIS — I63239 Cerebral infarction due to unspecified occlusion or stenosis of unspecified carotid arteries: Secondary | ICD-10-CM | POA: Diagnosis not present

## 2023-08-18 DIAGNOSIS — C61 Malignant neoplasm of prostate: Secondary | ICD-10-CM

## 2023-08-18 DIAGNOSIS — Z79899 Other long term (current) drug therapy: Secondary | ICD-10-CM | POA: Diagnosis not present

## 2023-08-18 DIAGNOSIS — Z8546 Personal history of malignant neoplasm of prostate: Secondary | ICD-10-CM | POA: Insufficient documentation

## 2023-08-18 DIAGNOSIS — Z923 Personal history of irradiation: Secondary | ICD-10-CM | POA: Insufficient documentation

## 2023-08-18 DIAGNOSIS — C090 Malignant neoplasm of tonsillar fossa: Secondary | ICD-10-CM | POA: Diagnosis not present

## 2023-08-18 DIAGNOSIS — N1330 Unspecified hydronephrosis: Secondary | ICD-10-CM | POA: Insufficient documentation

## 2023-08-18 DIAGNOSIS — N200 Calculus of kidney: Secondary | ICD-10-CM | POA: Diagnosis not present

## 2023-08-18 NOTE — Assessment & Plan Note (Signed)
Has chronic left-sided weakness.  Uses cane to walk at home.  Has inability to lift left upper extremity above his shoulder.

## 2023-08-18 NOTE — Assessment & Plan Note (Signed)
Believed to be a complication from his chronic bilateral renal stones and history of urological intervention.  Based on recent PET scan, his right kidney may be nonfunctional.  His creatinine has been stable overall and we will monitor this closely during systemic treatments.

## 2023-08-18 NOTE — Assessment & Plan Note (Signed)
Treated with radiation treatments in 2011 followed by ADT for few months.  He has not been on any treatments lately and remained on surveillance only.

## 2023-08-18 NOTE — Assessment & Plan Note (Addendum)
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  Arthur Cisneros has had consultation with radiation oncologist Arthur Cisneros on 08/08/2023.  Discussed findings so far, including PET/CT results. PET scan shows disease to the right tonsil and surrounding ipsilateral lymph nodes, cT2,cN1,M0 disease.   Arthur Cisneros spoke with Arthur Cisneros. The patient was felt to be a candidate for TORS resection, and hence Arthur Cisneros has been referred to [facility] for evaluation.  However after discussing surgery risks, patient opted to not proceed with surgery.  Reviewed NCCN guidelines and discussed treatment options.  In patients who are not candidates for surgical resection or who opt out of surgery, concurrent chemoradiation would be recommended.  However given Arthur nonfunctional right kidney and CKD, cisplatin would be not an option for him.  Discussed alternative options of carboplatin/paclitaxel weekly during the course of radiation.  Carboplatin still carries risk of renal injury but it can be dose adjusted depending on renal function.  Other option of cetuximab was also discussed briefly but with Arthur cardiac history, we would like to avoid that regimen.  After thorough discussion, I proposed that we plan for dose reduced chemotherapy with carboplatin/paclitaxel weekly during the course of radiation.  I gave him printed information regarding these medications for Arthur review and discussed side effect profile.  After discussing pros and cons with each systemic option with the patient and Arthur family members who were accompanying (Arthur Cisneros and Arthur Cisneros), patient is undecided at this time as to whether Arthur Cisneros would proceed with chemotherapy or just pursue radiation treatments.  Arthur Cisneros understands that radiation alone is not an ideal option for Arthur diagnosis, especially since Arthur Cisneros is not undergoing surgery.  Arthur Cisneros would like to get back with [place] next week after Arthur Cisneros decides.

## 2023-08-18 NOTE — Progress Notes (Signed)
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  He has had consultation with radiation oncologist Dr. Basilio Cairo on 08/08/2023.  Discussed findings so far, including PET/CT results. PET scan shows disease to the right tonsil and surrounding ipsilateral lymph nodes, cT2,cN1,M0 disease.   Dr.Squire spoke with Dr. Hezzie Bump. The patient was felt to be a candidate for TORS resection, and hence he has been referred to [facility] for evaluation.  However after discussing surgery risks, patient opted to not proceed with surgery.  Reviewed NCCN guidelines and discussed treatment options.  In patients who are not candidates for surgical resection or who opt out of surgery, concurrent chemoradiation would be recommended.  However given his nonfunctional right kidney and CKD, cisplatin would be not an option for him.  Discussed alternative options of carboplatin/paclitaxel weekly during the course of radiation.  Carboplatin still carries risk of renal injury but it can be dose adjusted depending on renal function.  Other option of cetuximab was also discussed briefly but with his cardiac history, we would like to avoid that regimen.  After thorough discussion, I proposed that we plan for dose reduced chemotherapy with carboplatin/paclitaxel weekly during the course of radiation.  I gave him printed information regarding these medications for his review and discussed side effect profile.  After discussing pros  and cons with each systemic option with the patient and his family members who were accompanying (his daughter and his wife), patient is undecided at this time as to whether he would proceed with chemotherapy or just pursue radiation treatments.  He understands that radiation alone is not an ideal option for his diagnosis, especially since he is not undergoing surgery.  He would like to get back with [place] next week after he decides.    Malignant neoplasm of prostate (HCC) Treated with radiation treatments in 2011 followed by ADT for few months.  He has not been on any treatments lately and remained on surveillance only.  Hydronephrosis of right kidney Believed to be a complication from his chronic bilateral renal stones and history of urological intervention.  Based on recent PET scan, his right kidney may be nonfunctional.  His creatinine has been stable overall and we will monitor this closely during systemic treatments.  Occlusion and stenosis of carotid artery with cerebral infarction Has chronic left-sided weakness.  Uses cane to walk at home.  Has inability to lift left upper extremity above his shoulder.    I reviewed lab results and outside records for this visit and discussed relevant results with the patient. Diagnosis, plan of care and treatment options were also discussed in detail with the patient. Opportunity provided to ask questions and answers provided to his apparent satisfaction. Provided instructions to call our clinic with any problems, questions or concerns prior to return visit. I recommended to continue follow-up with PCP and sub-specialists. He verbalized understanding and agreed with the plan.   NCCN guidelines have been consulted in the planning of this patient's care.  I provided 30 minutes of face-to-face time during this encounter and >  50% was spent counseling as documented under my assessment and plan.    Meryl Crutch, MD  08/18/2023 1:08 PM  [facility] AT [facility] [address] [place] [address] Dept: [phone] Dept Fax: [phone]    CHIEF COMPLAINT/ REASON FOR VISIT:   Follow-up for recently diagnosed squamous cell carcinoma of the right tonsil, p16 positive.   INTERVAL HISTORY:   Arthur Cisneros. is here today for repeat clinical assessment. He denies fevers or chills. He denies pain. His appetite is good. His weight has been stable.  He denies any new complaints compared to last visit.  I have reviewed the past medical history, past surgical history, social history and family history with the patient and they are unchanged from previous note.  ALLERGIES:  is allergic to 5-alpha reductase inhibitors, lyrica [pregabalin], motrin [ibuprofen], and tramadol.  MEDICATIONS:  Current Outpatient Medications  Medication Sig Dispense Refill   acetaminophen (TYLENOL) 500 MG tablet Take 1,000 mg by mouth every 6 (six) hours as needed (pain).      amLODipine (NORVASC) 5 MG tablet Take 5 mg by mouth daily.     atorvastatin (LIPITOR) 40 MG tablet Take 40 mg by mouth at bedtime.      cholecalciferol (VITAMIN D) 25 MCG (1000 UNIT) tablet Take 1,000 Units by mouth daily.     clopidogrel (PLAVIX) 75 MG tablet Take 75 mg by mouth daily.     fluticasone (FLONASE) 50 MCG/ACT nasal spray Place 1 spray into both nostrils daily.     levETIRAcetam (KEPPRA) 250 MG tablet Take 1 tablet (250 mg total) by mouth 2 (two) times daily. 180 tablet 3   losartan (COZAAR) 25 MG tablet Take 25 mg by mouth daily.     metFORMIN (GLUCOPHAGE-XR) 500 MG 24 hr tablet Take 1,000 mg by mouth 2 (two) times a day.      nitroGLYCERIN (NITROSTAT) 0.4 MG SL tablet DISSOLVE (1) TABLET UNDER TONGUE AS NEEDED TO RELIEVE CHEST PAIN. MAY REPEAT EVERY 5 MINUTES. (if no relief after 2nd dose, proceed to the ED for an evaluation or call 911). 25 tablet 3   tiZANidine (ZANAFLEX) 2 MG tablet TAKE 1 OR 2 TABLETS BY MOUTH AT BEDTIME AS NEEDED 90  tablet 3   vitamin B-12 (CYANOCOBALAMIN) 1000 MCG tablet Take 1,000 mcg by mouth daily.     ZETIA 10 MG tablet Take 10 mg by mouth daily.     No current facility-administered medications for this visit.    HISTORY OF PRESENT ILLNESS:   Oncology History  Primary cancer of tonsillar fossa (HCC)  07/20/2023 Initial Diagnosis   He is followed by Dr. Blase Mess at [facility] for surveillance of carotid artery stenosis. In this setting, he presented for CTA of the head and neck on 07/06/23 which incidentally revealed an asymmetric 2.9 cm soft tissue mass at the right tongue base/glossotonsillar sulcus concerning for malignancy, as well as multiple enlarged and necrotic appearing right sided cervical nodes metastatic lymphadenopathy.    Subsequently, he was referred to Dr. Jenne Pane at [facility] on 07/20/23 for further evaluation and management. Physical exam performed during this visit noted: a firm, round mass is palpable in the right upper neck measuring approximately 2.5 cm, a smaller mass just posterior to the 2.5 cm mass, and an isolated large irregular appearing right tonsillar mass. A laryngoscopy was also performed which redemonstrated the tonsillar mass.    Biopsy of the right tonsillar mass obtained on  07/20/23 by Dr. Jenne Pane showed findings consistent with non-keratinizing squamous cell carcinoma; HPV-associated (p16 positive).    07/20/2023 Procedure   Laryngoscopy and biopsy of right tonsillar mass by [facility]   07/20/2023 Pathology Results   Biopsy from the right tonsil showed nonkeratinizing squamous cell carcinoma, HPV associated (p16 positive).   08/04/2023 PET scan   A hypermetabolic right tonsillar mass with adjacent centrally necrotic level IIA lymph nodes with no distant metastatic disease identified. Progressive chronic severe right hydronephrosis and hydroureter with chronic obstructing 3 mm calculus in the distal right ureter was also seen. The right kidney appeared  nonfunctional.    08/08/2023 Cancer Staging   Staging form: Pharynx - HPV-Mediated Oropharynx, AJCC 8th Edition - Clinical stage from 08/08/2023: Stage I (cT2, cN1, cM0, p16+) - Signed by Lonie Peak, MD on 08/09/2023 Stage prefix: Initial diagnosis   08/16/2023 Miscellaneous   After discussing surgical options/ risks with Dr.Waltonen, patient opted to not proceed with surgery.  Hence plan made for concurrent chemoradiation.       REVIEW OF SYSTEMS:   Review of Systems  Constitutional:  Negative for appetite change, chills, fatigue, fever and unexpected weight change.  HENT:   Negative for hearing loss, mouth sores, nosebleeds, sore throat, tinnitus, trouble swallowing and voice change.   Eyes:  Negative for icterus.  Respiratory:  Negative for cough, hemoptysis and shortness of breath.   Cardiovascular:  Negative for chest pain, leg swelling and palpitations.  Gastrointestinal:  Negative for abdominal distention, abdominal pain, blood in stool, constipation, diarrhea, nausea and vomiting.  Genitourinary:  Negative for bladder incontinence, difficulty urinating, dysuria, frequency, hematuria, nocturia and pelvic pain.   Musculoskeletal:  Negative for arthralgias, back pain, myalgias and neck pain.  Skin:  Negative for rash.  Neurological:  Negative for dizziness, extremity weakness, headaches, light-headedness and numbness.  Hematological:  Does not bruise/bleed easily.  Psychiatric/Behavioral:  Negative for confusion.     All other pertinent systems were reviewed with the patient and are negative.   VITALS:  Blood pressure (!) 153/82, pulse 66, temperature 98.4 F (36.9 C), temperature source Oral, resp. rate 17, weight 224 lb 8 oz (101.8 kg), SpO2 93%.  Wt Readings from Last 3 Encounters:  08/18/23 224 lb 8 oz (101.8 kg)  08/11/23 223 lb 9.6 oz (101.4 kg)  08/10/23 225 lb 3.2 oz (102.2 kg)    Body mass index is 33.15 kg/m.  Performance status (ECOG): 2 - Symptomatic, <50%  confined to bed  PHYSICAL EXAM:   Physical Exam Constitutional:      General: He is not in acute distress.    Appearance: Normal appearance. He is not ill-appearing.  HENT:     Head: Normocephalic and atraumatic.     Nose: Nose normal.     Mouth/Throat:     Mouth: Mucous membranes are moist.     Pharynx: Oropharynx is clear. No oropharyngeal exudate.     Comments: Right sided enlarged tonsil noted on limited exam. Eyes:     General: No scleral icterus.    Extraocular Movements: Extraocular movements intact.     Conjunctiva/sclera: Conjunctivae normal.  Cardiovascular:     Rate and Rhythm: Normal rate and regular rhythm.     Heart sounds: Normal heart sounds.  Pulmonary:     Effort: Pulmonary effort is normal. No respiratory distress.     Breath sounds: Normal breath sounds. No wheezing.  Abdominal:     General: There is no distension.     Palpations: Abdomen is soft.  Tenderness: There is no abdominal tenderness.  Musculoskeletal:     Right lower leg: No edema.     Left lower leg: No edema.  Skin:    Findings: No rash.  Neurological:     Mental Status: He is alert and oriented to person, place, and time.     Comments: Decreased strength in both left upper extremity and left lower extremity  Psychiatric:        Behavior: Behavior normal.        Thought Content: Thought content normal.        Judgment: Judgment normal.    LABORATORY DATA:   I have reviewed the data as listed     Component Value Date/Time   NA 140 08/10/2023 1034   K 4.1 08/10/2023 1034   CL 109 08/10/2023 1034   CO2 23 08/10/2023 1034   GLUCOSE 97 08/10/2023 1034   BUN 20 08/10/2023 1034   CREATININE 1.23 08/10/2023 1034   CALCIUM 9.4 08/10/2023 1034   PROT 7.3 08/10/2023 1034   ALBUMIN 4.4 08/10/2023 1034   AST 14 (L) 08/10/2023 1034   ALT 23 08/10/2023 1034   ALKPHOS 41 08/10/2023 1034   BILITOT 0.4 08/10/2023 1034   GFRNONAA >60 08/10/2023 1034   GFRAA >60 01/23/2020 1327    Lab  Results  Component Value Date   WBC 8.5 08/10/2023   NEUTROABS 6.0 08/10/2023   HGB 15.1 08/10/2023   HCT 44.6 08/10/2023   MCV 90.8 08/10/2023   PLT 240 08/10/2023    RADIOGRAPHIC STUDIES:  I have personally reviewed the radiological images as listed and agree with the findings in the report.  NM PET Image Initial (PI) Skull Base To Thigh (F-18 FDG)  Result Date: 08/08/2023 CLINICAL DATA:  Initial treatment strategy for right tonsillar cancer with nodal metastasis. History of stroke. EXAM: NUCLEAR MEDICINE PET SKULL BASE TO THIGH TECHNIQUE: 11.67 mCi F-18 FDG was injected intravenously. Full-ring PET imaging was performed from the skull base to thigh after the radiotracer. CT data was obtained and used for attenuation correction and anatomic localization. Fasting blood glucose: 107 mg/dl COMPARISON:  CTA of the head and neck 07/06/2023. Abdominopelvic CT 11/20/2019. FINDINGS: Mediastinal blood pool activity: SUV max 3.0 NECK: Previously demonstrated right tonsillar mass is intensely hypermetabolic with an SUV max of 15.2. The tonsillar mass measures approximately 3.3 x 1.8 cm on image 26/6. There are adjacent centrally necrotic level IIA lymph nodes on the right with peripheral hypermetabolic activity. These include a 2.5 x 2.1 cm node on image 27/6 with an SUV max of 6.5 and a 2.3 x 1.6 cm node on the same image within SUV max of 5.4.No contralateral hypermetabolic lymph nodes identified. No other suspicious activity identified within the pharyngeal mucosal space. Incidental CT findings: Bilateral carotid atherosclerosis. Sequela of previous large right MCA stroke. CHEST: There are no hypermetabolic mediastinal, hilar or axillary lymph nodes. No hypermetabolic pulmonary activity or suspicious nodularity. Incidental CT findings: Status post median sternotomy and CABG. Atherosclerosis of the aorta, great vessels and coronary arteries. The heart is mildly enlarged. ABDOMEN/PELVIS: There is no  hypermetabolic activity within the liver, adrenal glands, spleen or pancreas. There is no hypermetabolic nodal activity in the abdomen or pelvis. Incidental CT findings: Progressive chronic severe right hydronephrosis and hydroureter with marked renal cortical thinning. No significant excretion of activity into the right collecting system demonstrated. There is a chronic obstructing 3 mm calculus in distal right ureter. Fiducial markers noted in the prostate gland. Multiple calcified  gallstones, aortic and branch vessel atherosclerosis noted. SKELETON: There is no hypermetabolic activity to suggest osseous metastatic disease. Incidental CT findings: Mild spondylosis. IMPRESSION: 1. Hypermetabolic right tonsillar mass with adjacent centrally necrotic level IIA lymph nodes consistent with known squamous cell carcinoma. 2. No distant metastatic disease identified. 3. Progressive chronic severe right hydronephrosis and hydroureter with chronic obstructing 3 mm calculus in the distal right ureter. The right kidney appears nonfunctional. 4.  Aortic Atherosclerosis (ICD10-I70.0). Electronically Signed   By: Carey Bullocks M.D.   On: 08/08/2023 12:02

## 2023-08-21 NOTE — Progress Notes (Signed)
Oncology Nurse Navigator Documentation   I received a call from Arthur Cisneros daughter, Arthur Cisneros. After considering the recommendation from Dr. Arlana Pouch to receive chemotherapy concurrently with radiation for his head and neck cancer, Arthur Cisneros has declined chemotherapy. He is agreeable to receive radiation and will have his radiation planning session tomorrow. I have made Dr. Arlana Pouch and Dr. Basilio Cairo aware of his decision.   Hedda Slade RN, BSN, OCN Head & Neck Oncology Nurse Navigator [facility] at [facility] Phone # [phone]  Fax # [phone]

## 2023-08-22 ENCOUNTER — Ambulatory Visit
Admission: RE | Admit: 2023-08-22 | Discharge: 2023-08-22 | Disposition: A | Discharge status: Home / Self Care | Payer: Medicare PPO | Source: Ambulatory Visit | Attending: Radiation Oncology | Admitting: Radiation Oncology

## 2023-08-22 DIAGNOSIS — C090 Malignant neoplasm of tonsillar fossa: Secondary | ICD-10-CM | POA: Insufficient documentation

## 2023-08-22 DIAGNOSIS — Z87891 Personal history of nicotine dependence: Secondary | ICD-10-CM | POA: Diagnosis not present

## 2023-08-22 DIAGNOSIS — Z510 Encounter for antineoplastic radiation therapy: Secondary | ICD-10-CM | POA: Insufficient documentation

## 2023-08-23 NOTE — Progress Notes (Signed)
Oncology Nurse Navigator Documentation   Mr. Dowdell presented for CT simulation today and he tolerated procedure without difficulty, denied questions/concerns.  He and his family know to contact me if they have any questions before his new start on 08/31/23.  Hedda Slade RN, BSN, OCN Head & Neck Oncology Nurse Navigator [facility] at [facility] Phone # [phone]  Fax # [phone]

## 2023-08-28 DIAGNOSIS — Z510 Encounter for antineoplastic radiation therapy: Secondary | ICD-10-CM | POA: Diagnosis not present

## 2023-08-31 ENCOUNTER — Ambulatory Visit
Admission: RE | Admit: 2023-08-31 | Discharge: 2023-08-31 | Disposition: A | Discharge status: Home / Self Care | Payer: Medicare PPO | Source: Ambulatory Visit | Attending: Radiation Oncology | Admitting: Radiation Oncology

## 2023-08-31 ENCOUNTER — Other Ambulatory Visit: Payer: Self-pay

## 2023-08-31 DIAGNOSIS — Z510 Encounter for antineoplastic radiation therapy: Secondary | ICD-10-CM | POA: Diagnosis not present

## 2023-08-31 LAB — RAD ONC ARIA SESSION SUMMARY
Course Elapsed Days: 0
Plan Fractions Treated to Date: 1
Plan Prescribed Dose Per Fraction: 2 Gy
Plan Total Fractions Prescribed: 35
Plan Total Prescribed Dose: 70 Gy
Reference Point Dosage Given to Date: 2 Gy
Reference Point Session Dosage Given: 2 Gy
Session Number: 1

## 2023-08-31 NOTE — Progress Notes (Signed)
Oncology Nurse Navigator Documentation   To provide support, encouragement and care continuity, met with Arthur Cisneros for his initial RT.  He was accompanied by his wife and daughter.  I reviewed the 2-step treatment process, answered questions.  Arthur Cisneros  completed treatment without difficulty, denied questions/concerns. I reviewed the registration/arrival procedure for subsequent treatments. I encouraged them to call me with questions/concerns as treatments proceed.   Hedda Slade RN, BSN, OCN Head & Neck Oncology Nurse Navigator [facility] at [facility] Phone # [phone]  Fax # [phone]

## 2023-09-01 ENCOUNTER — Other Ambulatory Visit: Payer: Self-pay

## 2023-09-01 ENCOUNTER — Ambulatory Visit
Admission: RE | Admit: 2023-09-01 | Discharge: 2023-09-01 | Disposition: A | Discharge status: Home / Self Care | Payer: Medicare PPO | Source: Ambulatory Visit | Attending: Radiation Oncology

## 2023-09-01 DIAGNOSIS — C090 Malignant neoplasm of tonsillar fossa: Secondary | ICD-10-CM

## 2023-09-01 DIAGNOSIS — Z510 Encounter for antineoplastic radiation therapy: Secondary | ICD-10-CM | POA: Diagnosis not present

## 2023-09-01 LAB — RAD ONC ARIA SESSION SUMMARY
Course Elapsed Days: 1
Plan Fractions Treated to Date: 2
Plan Prescribed Dose Per Fraction: 2 Gy
Plan Total Fractions Prescribed: 35
Plan Total Prescribed Dose: 70 Gy
Reference Point Dosage Given to Date: 4 Gy
Reference Point Session Dosage Given: 2 Gy
Session Number: 2

## 2023-09-04 ENCOUNTER — Other Ambulatory Visit: Payer: Self-pay | Admitting: Radiation Oncology

## 2023-09-04 ENCOUNTER — Other Ambulatory Visit: Payer: Self-pay

## 2023-09-04 ENCOUNTER — Ambulatory Visit
Admission: RE | Admit: 2023-09-04 | Discharge: 2023-09-04 | Disposition: A | Discharge status: Home / Self Care | Payer: Medicare PPO | Source: Ambulatory Visit | Attending: Radiation Oncology | Admitting: Radiation Oncology

## 2023-09-04 ENCOUNTER — Ambulatory Visit
Admission: RE | Admit: 2023-09-04 | Discharge: 2023-09-04 | Disposition: A | Discharge status: Home / Self Care | Payer: Medicare PPO | Source: Ambulatory Visit | Attending: Radiation Oncology

## 2023-09-04 DIAGNOSIS — C090 Malignant neoplasm of tonsillar fossa: Secondary | ICD-10-CM

## 2023-09-04 DIAGNOSIS — Z510 Encounter for antineoplastic radiation therapy: Secondary | ICD-10-CM | POA: Diagnosis not present

## 2023-09-04 LAB — RAD ONC ARIA SESSION SUMMARY
Course Elapsed Days: 4
Plan Fractions Treated to Date: 3
Plan Prescribed Dose Per Fraction: 2 Gy
Plan Total Fractions Prescribed: 35
Plan Total Prescribed Dose: 70 Gy
Reference Point Dosage Given to Date: 6 Gy
Reference Point Session Dosage Given: 2 Gy
Session Number: 3

## 2023-09-04 MED ORDER — LIDOCAINE VISCOUS HCL 2 % MT SOLN
OROMUCOSAL | 3 refills | Status: AC
Start: 2023-09-04 — End: ?

## 2023-09-04 MED ORDER — SONAFINE EX EMUL
1.0000 | Freq: Once | CUTANEOUS | Status: AC
  Administered 2023-09-04: 1 via TOPICAL

## 2023-09-05 ENCOUNTER — Inpatient Hospital Stay: Payer: Medicare PPO | Admitting: Dietician

## 2023-09-05 ENCOUNTER — Inpatient Hospital Stay: Payer: Medicare PPO

## 2023-09-05 ENCOUNTER — Ambulatory Visit
Admission: RE | Admit: 2023-09-05 | Discharge: 2023-09-05 | Discharge status: Home / Self Care | Payer: Medicare PPO | Source: Ambulatory Visit | Attending: Radiation Oncology

## 2023-09-05 ENCOUNTER — Other Ambulatory Visit: Payer: Self-pay

## 2023-09-05 DIAGNOSIS — Z510 Encounter for antineoplastic radiation therapy: Secondary | ICD-10-CM | POA: Diagnosis not present

## 2023-09-05 DIAGNOSIS — C090 Malignant neoplasm of tonsillar fossa: Secondary | ICD-10-CM

## 2023-09-05 LAB — RAD ONC ARIA SESSION SUMMARY
Course Elapsed Days: 5
Plan Fractions Treated to Date: 4
Plan Prescribed Dose Per Fraction: 2 Gy
Plan Total Fractions Prescribed: 35
Plan Total Prescribed Dose: 70 Gy
Reference Point Dosage Given to Date: 8 Gy
Reference Point Session Dosage Given: 2 Gy
Session Number: 4

## 2023-09-05 NOTE — Progress Notes (Signed)
[facility] Clinical Social Work  Clinical Social Work was referred by Statistician for assessment of psychosocial needs.  Clinical Social Worker contacted patient by phone to offer support and assess for needs.  CSW introduced herself as a member of his support team. CSW reviewed SDOH needs and screenings. Patient and wife denied any needs at this time. CSW sent contact information via mychart.   Marguerita Merles, LCSWA Clinical Social Worker [facility]

## 2023-09-05 NOTE — Progress Notes (Signed)
Nutrition Assessment   Reason for Assessment: HNC   ASSESSMENT: 67 year old male with SCC of right tonsil, p16 positive. Patient decided not to proceed with TORS. He is receiving radiation therapy. Patient is under the care of Dr. Basilio Cairo. He does not have feeding tube.   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 Patient is in a wheelchair. He is able to walk short distances with assistance of brace secondary to left foot drop. Patient has left sided weakness and mild speech deficit s/p stroke. Patient endorses a good appetite. Daughter reports pt eats all day. Recalls 2 eggs for breakfast, banana, pack of nabs for lunch. Had [facility] for dinner (2 pieces chx, mashed pots, biscuit). Patient drinks little water. States water "gets stuck" at diaphragm. This has been ongoing for many years. He tolerates small amounts. Patient drinks SF juices (crangrape, cranapple). Daughter asking about supplements. Wife purchased Ensure, but concerned this will run up his sugar too much. Patient denies nutrition impact symptoms at this time.   Medications: amlodipine, lipitor, vit D, plavix, keppra, xylocaine, cozaar, metformin, nitrostat, zanaflex, B12, zetia   Labs: 9/26 labs reviewed    Anthropometrics:   Height: 5'9" Weight: 224 lb 8 oz (10/4) UBW: 215-220 lb (last 12 months) BMI: 33.15   NUTRITION DIAGNOSIS: Food and nutrition related knowledge deficit related to [facility] as evidenced by no prior need for associated nutrition information     INTERVENTION:  Educated on importance of adequate calorie/protein energy intake to maintain strength/weights during treatment Explained rationale of  weekly nutrition visits Recommend small frequent meals/snacks and including protein sources at every meal Discussed nutrient profiles of ONS - would favor higher calorie supplement with wt decline and monitoring blood glucose  Educated on baking soda salt water rinses several times daily Discussed importance of hydration, recommend 64-80 ounces - suggested trying at different temperature    MONITORING, EVALUATION, GOAL: Patient will tolerate increased calories and protein to minimize wt loss during treatment    Next Visit: Friday November 1 before radiation

## 2023-09-06 ENCOUNTER — Ambulatory Visit
Admission: RE | Admit: 2023-09-06 | Discharge: 2023-09-06 | Disposition: A | Discharge status: Home / Self Care | Payer: Medicare PPO | Source: Ambulatory Visit | Attending: Radiation Oncology

## 2023-09-06 ENCOUNTER — Other Ambulatory Visit: Payer: Self-pay

## 2023-09-06 DIAGNOSIS — Z510 Encounter for antineoplastic radiation therapy: Secondary | ICD-10-CM | POA: Diagnosis not present

## 2023-09-06 LAB — RAD ONC ARIA SESSION SUMMARY
Course Elapsed Days: 6
Plan Fractions Treated to Date: 5
Plan Prescribed Dose Per Fraction: 2 Gy
Plan Total Fractions Prescribed: 35
Plan Total Prescribed Dose: 70 Gy
Reference Point Dosage Given to Date: 10 Gy
Reference Point Session Dosage Given: 2 Gy
Session Number: 5

## 2023-09-07 ENCOUNTER — Other Ambulatory Visit: Payer: Self-pay

## 2023-09-07 ENCOUNTER — Ambulatory Visit
Admission: RE | Admit: 2023-09-07 | Discharge: 2023-09-07 | Disposition: A | Discharge status: Home / Self Care | Payer: Medicare PPO | Source: Ambulatory Visit | Attending: Radiation Oncology | Admitting: Radiation Oncology

## 2023-09-07 DIAGNOSIS — Z510 Encounter for antineoplastic radiation therapy: Secondary | ICD-10-CM | POA: Diagnosis not present

## 2023-09-07 LAB — RAD ONC ARIA SESSION SUMMARY
Course Elapsed Days: 7
Plan Fractions Treated to Date: 6
Plan Prescribed Dose Per Fraction: 2 Gy
Plan Total Fractions Prescribed: 35
Plan Total Prescribed Dose: 70 Gy
Reference Point Dosage Given to Date: 12 Gy
Reference Point Session Dosage Given: 2 Gy
Session Number: 6

## 2023-09-08 ENCOUNTER — Ambulatory Visit
Admission: RE | Admit: 2023-09-08 | Discharge: 2023-09-08 | Disposition: A | Discharge status: Home / Self Care | Payer: Medicare PPO | Source: Ambulatory Visit | Attending: Radiation Oncology

## 2023-09-08 ENCOUNTER — Other Ambulatory Visit: Payer: Self-pay

## 2023-09-08 DIAGNOSIS — Z510 Encounter for antineoplastic radiation therapy: Secondary | ICD-10-CM | POA: Diagnosis not present

## 2023-09-08 LAB — RAD ONC ARIA SESSION SUMMARY
Course Elapsed Days: 8
Plan Fractions Treated to Date: 7
Plan Prescribed Dose Per Fraction: 2 Gy
Plan Total Fractions Prescribed: 35
Plan Total Prescribed Dose: 70 Gy
Reference Point Dosage Given to Date: 14 Gy
Reference Point Session Dosage Given: 2 Gy
Session Number: 7

## 2023-09-11 ENCOUNTER — Ambulatory Visit
Admission: RE | Admit: 2023-09-11 | Discharge: 2023-09-11 | Disposition: A | Discharge status: Home / Self Care | Payer: Medicare PPO | Source: Ambulatory Visit | Attending: Radiation Oncology

## 2023-09-11 ENCOUNTER — Ambulatory Visit
Admission: RE | Admit: 2023-09-11 | Discharge: 2023-09-11 | Disposition: A | Discharge status: Home / Self Care | Payer: Medicare PPO | Source: Ambulatory Visit | Attending: Radiation Oncology | Admitting: Radiation Oncology

## 2023-09-11 ENCOUNTER — Other Ambulatory Visit: Payer: Self-pay

## 2023-09-11 DIAGNOSIS — Z510 Encounter for antineoplastic radiation therapy: Secondary | ICD-10-CM | POA: Diagnosis not present

## 2023-09-11 LAB — RAD ONC ARIA SESSION SUMMARY
Course Elapsed Days: 11
Plan Fractions Treated to Date: 8
Plan Prescribed Dose Per Fraction: 2 Gy
Plan Total Fractions Prescribed: 35
Plan Total Prescribed Dose: 70 Gy
Reference Point Dosage Given to Date: 16 Gy
Reference Point Session Dosage Given: 2 Gy
Session Number: 8

## 2023-09-12 ENCOUNTER — Ambulatory Visit
Admission: RE | Admit: 2023-09-12 | Discharge: 2023-09-12 | Disposition: A | Discharge status: Home / Self Care | Payer: Medicare PPO | Source: Ambulatory Visit | Attending: Radiation Oncology

## 2023-09-12 ENCOUNTER — Other Ambulatory Visit: Payer: Self-pay

## 2023-09-12 DIAGNOSIS — Z510 Encounter for antineoplastic radiation therapy: Secondary | ICD-10-CM | POA: Diagnosis not present

## 2023-09-12 LAB — RAD ONC ARIA SESSION SUMMARY
Course Elapsed Days: 12
Plan Fractions Treated to Date: 9
Plan Prescribed Dose Per Fraction: 2 Gy
Plan Total Fractions Prescribed: 35
Plan Total Prescribed Dose: 70 Gy
Reference Point Dosage Given to Date: 18 Gy
Reference Point Session Dosage Given: 2 Gy
Session Number: 9

## 2023-09-13 ENCOUNTER — Other Ambulatory Visit: Payer: Self-pay

## 2023-09-13 ENCOUNTER — Ambulatory Visit
Admission: RE | Admit: 2023-09-13 | Discharge: 2023-09-13 | Disposition: A | Discharge status: Home / Self Care | Payer: Medicare PPO | Source: Ambulatory Visit | Attending: Radiation Oncology

## 2023-09-13 DIAGNOSIS — Z510 Encounter for antineoplastic radiation therapy: Secondary | ICD-10-CM | POA: Diagnosis not present

## 2023-09-13 LAB — RAD ONC ARIA SESSION SUMMARY
Course Elapsed Days: 13
Plan Fractions Treated to Date: 10
Plan Prescribed Dose Per Fraction: 2 Gy
Plan Total Fractions Prescribed: 35
Plan Total Prescribed Dose: 70 Gy
Reference Point Dosage Given to Date: 20 Gy
Reference Point Session Dosage Given: 2 Gy
Session Number: 10

## 2023-09-14 ENCOUNTER — Other Ambulatory Visit: Payer: Self-pay

## 2023-09-14 ENCOUNTER — Ambulatory Visit
Admission: RE | Admit: 2023-09-14 | Discharge: 2023-09-14 | Disposition: A | Discharge status: Home / Self Care | Payer: Medicare PPO | Source: Ambulatory Visit | Attending: Radiation Oncology | Admitting: Radiation Oncology

## 2023-09-14 DIAGNOSIS — Z510 Encounter for antineoplastic radiation therapy: Secondary | ICD-10-CM | POA: Diagnosis not present

## 2023-09-14 LAB — RAD ONC ARIA SESSION SUMMARY
Course Elapsed Days: 14
Plan Fractions Treated to Date: 11
Plan Prescribed Dose Per Fraction: 2 Gy
Plan Total Fractions Prescribed: 35
Plan Total Prescribed Dose: 70 Gy
Reference Point Dosage Given to Date: 22 Gy
Reference Point Session Dosage Given: 2 Gy
Session Number: 11

## 2023-09-15 ENCOUNTER — Other Ambulatory Visit: Payer: Self-pay

## 2023-09-15 ENCOUNTER — Inpatient Hospital Stay: Payer: Medicare PPO | Attending: Oncology | Admitting: Dietician

## 2023-09-15 ENCOUNTER — Ambulatory Visit
Admission: RE | Admit: 2023-09-15 | Discharge: 2023-09-15 | Disposition: A | Discharge status: Home / Self Care | Payer: Medicare PPO | Source: Ambulatory Visit | Attending: Radiation Oncology | Admitting: Radiation Oncology

## 2023-09-15 DIAGNOSIS — Z87891 Personal history of nicotine dependence: Secondary | ICD-10-CM | POA: Insufficient documentation

## 2023-09-15 DIAGNOSIS — C770 Secondary and unspecified malignant neoplasm of lymph nodes of head, face and neck: Secondary | ICD-10-CM | POA: Diagnosis present

## 2023-09-15 DIAGNOSIS — C090 Malignant neoplasm of tonsillar fossa: Secondary | ICD-10-CM | POA: Diagnosis present

## 2023-09-15 DIAGNOSIS — Z510 Encounter for antineoplastic radiation therapy: Secondary | ICD-10-CM | POA: Insufficient documentation

## 2023-09-15 LAB — RAD ONC ARIA SESSION SUMMARY
Course Elapsed Days: 15
Plan Fractions Treated to Date: 12
Plan Prescribed Dose Per Fraction: 2 Gy
Plan Total Fractions Prescribed: 35
Plan Total Prescribed Dose: 70 Gy
Reference Point Dosage Given to Date: 24 Gy
Reference Point Session Dosage Given: 2 Gy
Session Number: 12

## 2023-09-18 ENCOUNTER — Ambulatory Visit: Payer: Medicare PPO

## 2023-09-18 ENCOUNTER — Ambulatory Visit
Admission: RE | Admit: 2023-09-18 | Discharge: 2023-09-18 | Disposition: A | Discharge status: Home / Self Care | Payer: Medicare PPO | Source: Ambulatory Visit | Attending: Radiation Oncology | Admitting: Radiation Oncology

## 2023-09-18 ENCOUNTER — Other Ambulatory Visit: Payer: Self-pay

## 2023-09-18 DIAGNOSIS — Z510 Encounter for antineoplastic radiation therapy: Secondary | ICD-10-CM | POA: Diagnosis not present

## 2023-09-18 LAB — RAD ONC ARIA SESSION SUMMARY
Course Elapsed Days: 18
Plan Fractions Treated to Date: 13
Plan Prescribed Dose Per Fraction: 2 Gy
Plan Total Fractions Prescribed: 35
Plan Total Prescribed Dose: 70 Gy
Reference Point Dosage Given to Date: 26 Gy
Reference Point Session Dosage Given: 2 Gy
Session Number: 13

## 2023-09-19 ENCOUNTER — Ambulatory Visit
Admission: RE | Admit: 2023-09-19 | Discharge: 2023-09-19 | Disposition: A | Discharge status: Home / Self Care | Payer: Medicare PPO | Source: Ambulatory Visit | Attending: Radiation Oncology

## 2023-09-19 ENCOUNTER — Other Ambulatory Visit: Payer: Self-pay

## 2023-09-19 DIAGNOSIS — Z510 Encounter for antineoplastic radiation therapy: Secondary | ICD-10-CM | POA: Diagnosis not present

## 2023-09-19 LAB — RAD ONC ARIA SESSION SUMMARY
Course Elapsed Days: 19
Plan Fractions Treated to Date: 14
Plan Prescribed Dose Per Fraction: 2 Gy
Plan Total Fractions Prescribed: 35
Plan Total Prescribed Dose: 70 Gy
Reference Point Dosage Given to Date: 28 Gy
Reference Point Session Dosage Given: 2 Gy
Session Number: 14

## 2023-09-19 NOTE — Progress Notes (Signed)
Nutrition Follow-up:   Pt with SCC of right tonsil, p16 positive. Patient decided not to proceed with TORS. He is receiving radiation therapy.   Met with patient in office. Wife and daughter are present for visit. Patient reports mild sore throat and dry mouth. His appetite is decreased. Patient is drinking 2 protein shakes. Yesterday had green beans, fish, slaw, hush puppies for lunch, wings, 1/2 hamburger, fries for dinner. He is doing lidocaine rinses. This taste horrible. Patient is not doing baking soda salt water rinses.    Medications: reviewed   Labs: no new labs   Anthropometrics: Wt 220.2 lb on 10/28   10/21 - 221.2 lb    NUTRITION DIAGNOSIS: Food and nutrition related knowledge deficit improving    INTERVENTION:  Continue drinking 2 Ensure Complete/equivalent for added calories and protein  Educated on using lidocaine rinse ~20 minutes prior to eating vs using for a rinse as this is pain medication Encouraged baking soda salt water rinses several times daily Encouraged soft moist textures for ease of intake     MONITORING, EVALUATION, GOAL: wt trends, intake   NEXT VISIT: Thursday November 7 via telephone (per pt request)

## 2023-09-19 NOTE — Addendum Note (Signed)
Encounter addended by: Dagoberto Reef on: 09/19/2023 10:36 AM  Actions taken: Imaging Exam begun, Charge Capture section accepted

## 2023-09-20 ENCOUNTER — Ambulatory Visit
Admission: RE | Admit: 2023-09-20 | Discharge: 2023-09-20 | Disposition: A | Discharge status: Home / Self Care | Payer: Medicare PPO | Source: Ambulatory Visit | Attending: Radiation Oncology

## 2023-09-20 ENCOUNTER — Other Ambulatory Visit: Payer: Self-pay

## 2023-09-20 DIAGNOSIS — Z510 Encounter for antineoplastic radiation therapy: Secondary | ICD-10-CM | POA: Diagnosis not present

## 2023-09-20 LAB — RAD ONC ARIA SESSION SUMMARY
Course Elapsed Days: 20
Plan Fractions Treated to Date: 15
Plan Prescribed Dose Per Fraction: 2 Gy
Plan Total Fractions Prescribed: 35
Plan Total Prescribed Dose: 70 Gy
Reference Point Dosage Given to Date: 30 Gy
Reference Point Session Dosage Given: 2 Gy
Session Number: 15

## 2023-09-21 ENCOUNTER — Ambulatory Visit: Payer: Medicare PPO | Attending: Radiation Oncology | Admitting: Physical Therapy

## 2023-09-21 ENCOUNTER — Encounter: Payer: Self-pay | Admitting: Physical Therapy

## 2023-09-21 ENCOUNTER — Other Ambulatory Visit: Payer: Self-pay

## 2023-09-21 ENCOUNTER — Inpatient Hospital Stay: Payer: Medicare PPO | Admitting: Nutrition

## 2023-09-21 ENCOUNTER — Ambulatory Visit
Admission: RE | Admit: 2023-09-21 | Discharge: 2023-09-21 | Disposition: A | Discharge status: Home / Self Care | Payer: Medicare PPO | Source: Ambulatory Visit | Attending: Radiation Oncology | Admitting: Radiation Oncology

## 2023-09-21 ENCOUNTER — Ambulatory Visit: Payer: Medicare PPO | Attending: Radiation Oncology

## 2023-09-21 DIAGNOSIS — R1310 Dysphagia, unspecified: Secondary | ICD-10-CM | POA: Insufficient documentation

## 2023-09-21 DIAGNOSIS — C090 Malignant neoplasm of tonsillar fossa: Secondary | ICD-10-CM | POA: Insufficient documentation

## 2023-09-21 DIAGNOSIS — R471 Dysarthria and anarthria: Secondary | ICD-10-CM | POA: Diagnosis present

## 2023-09-21 DIAGNOSIS — R293 Abnormal posture: Secondary | ICD-10-CM | POA: Insufficient documentation

## 2023-09-21 DIAGNOSIS — Z510 Encounter for antineoplastic radiation therapy: Secondary | ICD-10-CM | POA: Diagnosis not present

## 2023-09-21 LAB — RAD ONC ARIA SESSION SUMMARY
Course Elapsed Days: 21
Plan Fractions Treated to Date: 16
Plan Prescribed Dose Per Fraction: 2 Gy
Plan Total Fractions Prescribed: 35
Plan Total Prescribed Dose: 70 Gy
Reference Point Dosage Given to Date: 32 Gy
Reference Point Session Dosage Given: 2 Gy
Session Number: 16

## 2023-09-21 NOTE — Therapy (Addendum)
OUTPATIENT SPEECH LANGUAGE PATHOLOGY ONCOLOGY EVALUATION   Patient Name: Arthur Cisneros. MRN: [id number] DOB:30-Jan-1956, 67 y.o., male Today's Date: 09/21/2023  PCP: [facility] REFERRING PROVIDER: Lonie Peak, MD  END OF SESSION:  End of Session - 09/21/23 1619     Visit Number 1    Number of Visits 7    Date for SLP Re-Evaluation 12/20/23    SLP Start Time 1105    SLP Stop Time  1142    SLP Time Calculation (min) 37 min    Activity Tolerance Patient tolerated treatment well             Past Medical History:  Diagnosis Date   Carotid artery disease (HCC)    Coronary artery disease    Multivessel disease status post CABG in [facility] at [facility]   Depression    Essential hypertension    Hyperlipidemia    Nephrolithiasis    Prostate cancer (HCC) 2016   Status post XRT   Sepsis (HCC) 06/11/2019   Stroke [facility]) 2011   Right MCA infarct   Type 2 diabetes mellitus (HCC)    Past Surgical History:  Procedure Laterality Date   CORONARY ARTERY BYPASS GRAFT     4 graft   CYSTOSCOPY WITH STENT PLACEMENT Right 06/11/2019   Procedure: CYSTOSCOPY WITH STENT PLACEMENT;  Surgeon: Crista Elliot, MD;  Location: [facility];  Service: Urology;  Laterality: Right;   CYSTOSCOPY/URETEROSCOPY/HOLMIUM LASER/STENT PLACEMENT Right 06/06/2019   Procedure: CYSTOSCOPY/URETEROSCOPY/HOLMIUM LASER/STENT PLACEMENT;  Surgeon: Heloise Purpura, MD;  Location: [facility];  Service: Urology;  Laterality: Right;   CYSTOSCOPY/URETEROSCOPY/HOLMIUM LASER/STENT PLACEMENT Right 07/15/2019   Procedure: CYSTOSCOPY/URETEROSCOPY/STENT PLACEMENT;  Surgeon: Heloise Purpura, MD;  Location: [facility];  Service: Urology;  Laterality: Right;   CYSTOSCOPY/URETEROSCOPY/HOLMIUM LASER/STENT PLACEMENT Bilateral 01/30/2020   Procedure: CYSTOSCOPY/RETROGRADE/URETEROSCOPY /RIGHT STENT PLACEMENT;  Surgeon: Heloise Purpura, MD;  Location: [facility];  Service: Urology;  Laterality: Bilateral;   PROSTATE BIOPSY     right hand surgery      Patient Active Problem List   Diagnosis Date Noted   Bilateral renal stones 08/18/2023   Hydronephrosis of right kidney 08/10/2023   Hydroureter on right 08/10/2023   Primary cancer of tonsillar fossa (HCC) 08/08/2023   History of stroke 12/18/2017   Malignant neoplasm of prostate (HCC) 08/17/2015   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/11/2015   Spastic hemiparesis affecting nondominant side (HCC) 06/11/2015   Seizure disorder, 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HCC) 06/11/2015   Arteriosclerosis of bypass graft of coronary artery 05/19/2015   Abnormal prostate specific antigen 05/19/2015   Essential (primary) hypertension 05/19/2015   Acid reflux 05/19/2015   Borderline diabetes 05/19/2015   HLD (hyperlipidemia) 05/19/2015   Acute ischemic vertebrobasilar artery brainstem stroke involving right-sided vessel (HCC) 12/02/2014   Headache 09/09/2014   Spondylosis of lumbar region without myelopathy or radiculopathy 08/29/2014   Arthropathy of sacroiliac joint 08/29/2014   Spastic hemiplegia affecting nondominant side (HCC) 05/09/2014   Nontraumatic shoulder pain 05/09/2014   Occlusion and stenosis of carotid artery with cerebral infarction 08/05/2013   Convulsions (HCC) 08/05/2013   Bicipital tenosynovitis 03/23/2012   Left spastic hemiparesis (HCC) 03/23/2012    ONSET DATE: See "pertinent history" below   REFERRING DIAG:  C09.0 (ICD-10-CM) - Primary cancer of tonsillar fossa (HCC)     THERAPY DIAG:  Dysphagia, unspecified type - Plan: SLP plan of care cert/re-cert  Dysarthria and anarthria - Plan: SLP plan of care cert/re-cert  Rationale for Evaluation and Treatment: Rehabilitation  SUBJECTIVE:  SUBJECTIVE STATEMENT: Denies dysphagia s/sx, currently. Pt accompanied by: significant other and family member  PERTINENT HISTORY:  CVA with resulting L sided weakness in 2011.  SCC of the right tonsil, stage I (T2N1M0, p 16 +). He is followed by [facility] and  vein specialist for surveillance of carotid artery stenosis. In that setting he had CTA of the head and neck on 07/06/23 which incidentally revealed an asymmetric 2.9 cm soft tissue mass at the right tongue base/glossotonsillar sulcus concerning for malignancy, as well as multiple enlarged and necrotic appearing right sided cervical nodes metastatic lymphadenopathy. 07/20/23 He saw Dr. Jenne Pane for further evaluation. Physical exam performed noted a firm, round mass that was palpable in the right upper neck measuring approx. 2.5 cm and a smaller mass just posterior and an isolated large irregular appearing right tonsillar mass. He completed a biopsy in office that day of the right tonsil mass which identified non- keratinizing SCC, p 16 +. 08/04/23 PET demonstrated: a hypermetabolic right tonsillar mass with adjacent centrally necrotic level IIA lymph nodes with no distant metastatic disease identified. Progressive chronic severe right hydronephrosis and hydroureter with chronic obstructing 3 mm calculus in the distal right ureter was also seen. (Patient follows with Dr. Laverle Patter for these issues). 08/08/23 Consult with Dr. Basilio Cairo and 08/10/23 Consult with Dr. Arlana Pouch. Chemo/radiation was recommended but patient has declined chemotherapy and will receive radiation alone. Treatment plan:  He will receive 35 fractions of radiation to his tonsil and right neck. He started on 10/17 and he will complete 12/6.   PAIN:  Are you having pain? No  FALLS: Has patient fallen in last 6 months?  See PT evaluation for details  LIVING ENVIRONMENT: Lives with: lives with their family Lives in: House/apartment  PLOF:  Level of assistance: Comment: See PT Eval Employment: Retired  PATIENT GOALS: Pt indicated he wanted to continue WNL swallowing  OBJECTIVE:  Note: Objective measures were completed at Evaluation unless otherwise noted. DIAGNOSTIC FINDINGS: See "Pertinent history" avove  INSTRUMENTAL SWALLOW STUDY FINDINGS   N/A  COGNITION: Overall cognitive status: Within functional limits for tasks assessed; ? memory as pt daughter answered some questions for pt  LANGUAGE: Receptive and Expressive language appeared WNL.  ORAL MOTOR EXAMINATION: Overall status: Impaired: Labial: Left (Symmetry, Strength, and Coordination) Lingual: Left (Symmetry, Strength, and Coordination) Facial: Left (Symmetry) Comments: Residual lt side weakness from CVA   MOTOR SPEECH: Overall motor speech: impaired Level of impairment: Sentence Respiration: thoracic breathing Phonation: normal Resonance: WFL Articulation: Impaired: sentence Intelligibility: Intelligible Motor planning: Appears intact Effective technique: over articulate  SUBJECTIVE DYSPHAGIA REPORTS:  Date of onset: "I always held water in my mouth - even when I was a kid" Reported symptoms:  oral holding  Current diet: regular and thin liquids  Co-morbid voice changes: No  FACTORS WHICH MAY INCREASE RISK OF ADVERSE EVENT IN PRESENCE OF ASPIRATION:  General health: well appearing  Risk factors: reduced cognitive function (? Memory)  CLINICAL SWALLOW ASSESSMENT:   Dentition: dentures (upper) and dentures (lower)  Vocal quality at baseline: normal Patient directly observed with POs: Yes: dysphagia 3 (soft) and thin liquids  Feeding: able to feed self Liquids provided by: cup Oral phase signs and symptoms:  none; SLP did not note oral holding Pharyngeal phase signs and symptoms:  none   09/21/23 (eval): Research states the risk for dysphagia increases due to radiation and/or chemotherapy treatment due to a variety of factors, so SLP educated the pt about the possibility of reduced/limited ability for  PO intake during rad tx. SLP also educated pt regarding possible changes to swallowing musculature after rad tx, and why adherence to dysphagia HEP provided today and PO consumption was necessary to inhibit muscle fibrosis following rad tx and to mitigate  muscle disuse atrophy. SLP informed pt why this would be detrimental to their swallowing status and to their pulmonary health. Pt demonstrated understanding of these things to SLP. SLP encouraged pt to safely eat and drink as deep into their radiation/chemotherapy as possible to provide the best possible long-term swallowing outcome for pt.  SLP then developed an individualized HEP for pt involving oral and pharyngeal strengthening and ROM and pt was instructed how to perform these exercises, including SLP demonstration. After SLP demonstration, pt return demonstrated each exercise. SLP ensured pt performance was correct prior to educating pt on next exercise. Pt required occasional mod cues faded to modified independent to perform HEP. Pt was instructed to complete this program 6-7 days/week, at least 20 reps a day (except Shaker - 6 reps/day no more than 3 at one time) until 6 months after his or her last day of rad tx, and then x2 a week after that, indefinitely. Among other modifications for days when pt cannot functionally swallow, SLP also suggested pt to perform only non-swallowing tasks on the handout/HEP, and if necessary to cycle through the swallowing portion so the full program of exercises can be completed instead of fatiguing on one of the swallowing exercises and being unable to perform the other swallowing exercises. SLP instructed that swallowing exercises should then be added back into the regimen as pt is able to do so.   PATIENT EDUCATION: Education details: late effects head/neck radiation on swallow function, HEP procedure, and modification to HEP when difficulty experienced with swallowing during and after radiation course Person educated: Patient Education method: Explanation, Demonstration, Verbal cues, and Handouts Education comprehension: verbalized understanding, returned demonstration, verbal cues required, and needs further education   ASSESSMENT:  CLINICAL  IMPRESSION: Patient is a 67 y.o. M who was seen today for assessment of swallowing as they undergo radiation/chemoradiation therapy. Today pt ate [place] sandwich and drank thin liquids without overt s/s oral or pharyngeal difficulty. Pt suffered CVA 13 years ago which has affected his speech intelligibility although he reports people understand his speech. Pt was 95% intelligible to this listener today. At this time pt swallowing is deemed WNL/WFL with these POs. No oral or overt s/sx pharyngeal deficits, including aspiration were observed. There are no overt s/s aspiration PNA observed by SLP nor any reported by pt at this time. Data indicate that pt's swallow ability will likely decrease over the course of radiation/chemoradiation therapy and could very well decline over time following the conclusion of that therapy due to muscle disuse atrophy and/or muscle fibrosis. Pt will cont to need to be seen by SLP in order to assess safety of PO intake, assess the need for recommending any objective swallow assessment, and ensuring pt is correctly completing the individualized HEP.  OBJECTIVE IMPAIRMENTS: include dysphagia. These impairments are limiting patient from safety when swallowing. Factors affecting potential to achieve goals and functional outcome are none noted today. Patient will benefit from skilled SLP services to address above impairments and improve overall function.  REHAB POTENTIAL: Excellent   GOALS: Goals reviewed with patient? No  SHORT TERM GOALS: Target: 3rd total session   1. Pt will compelte HEP with rare min A in 2 sessions Baseline: Goal status: INITIAL   2.  pt  will tell SLP why pt is completing HEP with min ?ing cues Baseline:  Goal status: INITIAL   3.  pt and/or family will describe 3 overt s/s aspiration PNA with modified independence Baseline:  Goal status: INITIAL   4.  pt and/or family will tell SLP how a food journal could hasten return to a more normalized  diet Baseline:  Goal status: INITIAL     LONG TERM GOALS: Target: 7th total session   1.  pt will complete HEP with modified independence over two visits Baseline:  Goal status: INITIAL   2.  Pt and/or family will describe how to modify HEP over time, and the timeline associated with reduction in HEP frequency with modified independence over two sessions Baseline:  Goal status: INITIAL     PLAN:   SLP FREQUENCY:  once approx every 4 weeks   SLP DURATION:  7 sessions   PLANNED INTERVENTIONS: Aspiration precaution training, Pharyngeal strengthening exercises, Diet toleration management , Trials of upgraded texture/liquids, SLP instruction and feedback, Compensatory strategies, and Patient/family education   [facility], CCC-SLP 09/21/2023, 4:20 PM

## 2023-09-21 NOTE — Patient Instructions (Signed)
SWALLOWING EXERCISES Do these 5-6 days/week until 6 months after your last day of radiation, then 2 days per week afterwards You can use 1-2 drops of liquid to help you swallow, if your mouth gets dry  Effortful Swallows - Press your tongue against the roof of your mouth for 3 seconds, then swallow as hard as you can - Do at least 20 reps/day, in sets of 5-10  [place] Swallow - swallow with your tongue sticking out - Stick tongue out past your lips and gently bite tongue with your teeth - Swallow, while holding your tongue with your teeth - Do at least 20 reps/day, in sets of 5-10   Shaker Exercise - head lift - Lie flat on your back in your bed, the floor, or a couch  - Raise your head and look at your feet - KEEP YOUR SHOULDERS DOWN - HOLD FOR 45-60 SECONDS, then lower your head back down - Repeat 3 times, 2-3 times a day  [employer] - "squeeze swallow" exercise - Swallow, and squeeze tight to keep your Adam's Apple up - Hold the squeeze for 5-7 seconds - then relax - Do at least 20 reps/day, in sets of 5-10

## 2023-09-21 NOTE — Therapy (Signed)
OUTPATIENT PHYSICAL THERAPY HEAD AND NECK BASELINE EVALUATION   Patient Name: Arthur Cisneros. MRN: [id number] DOB:[birth year], 67 y.o., male Today's Date: 09/21/2023  END OF SESSION:  PT End of Session - 09/21/23 1039     Visit Number 1    Number of Visits 2    Date for PT Re-Evaluation 11/16/23    PT Start Time 1003    PT Stop Time 1029    PT Time Calculation (min) 26 min    Activity Tolerance Patient tolerated treatment well    Behavior During Therapy WFL for tasks assessed/performed             Past Medical History:  Diagnosis Date   Carotid artery disease (HCC)    Coronary artery disease    Multivessel disease status post CABG in 2000 at [facility]   Depression    Essential hypertension    Hyperlipidemia    Nephrolithiasis    Prostate cancer (HCC) 2016   Status post XRT   Sepsis (HCC) 06/11/2019   Stroke [facility]) 2011   Right MCA infarct   Type 2 diabetes mellitus (HCC)    Past Surgical History:  Procedure Laterality Date   CORONARY ARTERY BYPASS GRAFT     4 graft   CYSTOSCOPY WITH STENT PLACEMENT Right 06/11/2019   Procedure: CYSTOSCOPY WITH STENT PLACEMENT;  Surgeon: Crista Elliot, MD;  Location: [facility];  Service: Urology;  Laterality: Right;   CYSTOSCOPY/URETEROSCOPY/HOLMIUM LASER/STENT PLACEMENT Right 06/06/2019   Procedure: CYSTOSCOPY/URETEROSCOPY/HOLMIUM LASER/STENT PLACEMENT;  Surgeon: Heloise Purpura, MD;  Location: [facility];  Service: Urology;  Laterality: Right;   CYSTOSCOPY/URETEROSCOPY/HOLMIUM LASER/STENT PLACEMENT Right 07/15/2019   Procedure: CYSTOSCOPY/URETEROSCOPY/STENT PLACEMENT;  Surgeon: Heloise Purpura, MD;  Location: [facility];  Service: Urology;  Laterality: Right;   CYSTOSCOPY/URETEROSCOPY/HOLMIUM LASER/STENT PLACEMENT Bilateral 01/30/2020   Procedure: CYSTOSCOPY/RETROGRADE/URETEROSCOPY /RIGHT STENT PLACEMENT;  Surgeon: Heloise Purpura, MD;  Location: [facility];  Service: Urology;  Laterality: Bilateral;   PROSTATE BIOPSY     right hand  surgery     Patient Active Problem List   Diagnosis Date Noted   Bilateral renal stones 08/18/2023   Hydronephrosis of right kidney 08/10/2023   Hydroureter on right 08/10/2023   Primary cancer of tonsillar fossa (HCC) 08/08/2023   History of stroke 12/18/2017   Malignant neoplasm of prostate (HCC) 08/17/2015   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/11/2015   Spastic hemiparesis affecting nondominant side (HCC) 06/11/2015   Seizure disorder, 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HCC) 06/11/2015   Arteriosclerosis of bypass graft of coronary artery 05/19/2015   Abnormal prostate specific antigen 05/19/2015   Essential (primary) hypertension 05/19/2015   Acid reflux 05/19/2015   Borderline diabetes 05/19/2015   HLD (hyperlipidemia) 05/19/2015   Acute ischemic vertebrobasilar artery brainstem stroke involving right-sided vessel (HCC) 12/02/2014   Headache 09/09/2014   Spondylosis of lumbar region without myelopathy or radiculopathy 08/29/2014   Arthropathy of sacroiliac joint 08/29/2014   Spastic hemiplegia affecting nondominant side (HCC) 05/09/2014   Nontraumatic shoulder pain 05/09/2014   Occlusion and stenosis of carotid artery with cerebral infarction 08/05/2013   Convulsions (HCC) 08/05/2013   Bicipital tenosynovitis 03/23/2012   Left spastic hemiparesis (HCC) 03/23/2012    PCP: [facility]  REFERRING PROVIDER: Lonie Peak, MD  REFERRING DIAG:  C09.0 (ICD-10-CM) - Primary cancer of tonsillar fossa (HCC)   THERAPY DIAG:  Abnormal posture  Primary cancer of tonsillar fossa (HCC)  Rationale for Evaluation and Treatment: Rehabilitation  ONSET DATE: 07/06/23  SUBJECTIVE:  SUBJECTIVE STATEMENT: Patient reports they are here today to be seen by their medical team for newly diagnosed cancer of R tonsil.    PERTINENT HISTORY:  SCC of the right tonsil, stage I (T2N1M0, p 16 +) He is followed by [facility] and vein specialist for surveillance of  carotid artery stenosis. In that setting he had CTA of the head and neck on 07/06/23 which incidentally revealed an asymmetric 2.9 cm soft tissue mass at the right tongue base/glossotonsillar sulcus concerning for malignancy, as well as multiple enlarged and necrotic appearing right sided cervical nodes metastatic lymphadenopathy. 07/20/23 He saw Dr. Jenne Pane for further evaluation. Physical exam performed noted a firm, round mass that was palpable in the right upper neck measuring approx. 2.5 cm and a smaller mass just posterior and an isolated large irregular appearing right tonsillar mass. He completed a biopsy in office that day of the right tonsil mass which identified non- keratinizing SCC, p 16 +.  08/04/23 PET demonstrated: a hypermetabolic right tonsillar mass with adjacent centrally necrotic level IIA lymph nodes with no distant metastatic disease identified. Progressive chronic severe right hydronephrosis and hydroureter with chronic obstructing 3 mm calculus in the distal right ureter was also seen. (Patient follows with Dr. Laverle Patter for these issues). 08/08/23 Consult with Dr. Basilio Cairo and 08/10/23 Consult with Dr. Arlana Pouch. Chemo/radiation was recommended but patient has declined chemotherapy and will receive radiation alone. He will receive 35 fractions of radiation to his tonsil and right neck. He started on 10/17 and he will complete 12/6. He has a history of a stroke causing left sided weakness. He uses a cane at home but needs a wheelchair while in the cancer center.   PATIENT GOALS:   to be educated about the signs and symptoms of lymphedema and learn post op HEP.   PAIN:  Are you having pain? No no pain, but scratchy throat  PRECAUTIONS: Active CA and Comment (previous stroke 07/05/2010 with L sided weakness)  RED FLAGS: None   WEIGHT BEARING RESTRICTIONS: No  FALLS:  Has patient fallen in last 6 months? Yes. Number of falls 1 slipped in wet grass Does the patient have a fear of falling that  limits activity? No Is the patient reluctant to leave the house due to a fear of falling?No  LIVING ENVIRONMENT: Patient lives with: wife and grandson Lives in: Mobile home Has following equipment at home: Counselling psychologist, Wheelchair (power), and Grab bars  OCCUPATION: retired  LEISURE: does not exercise, pt reports he walks around the house  PRIOR LEVEL OF FUNCTION: Independent with basic ADLs needs help with bathing on the L side, help with cooking   OBJECTIVE: Note: Objective measures were completed at Evaluation unless otherwise noted.  COGNITION: Overall cognitive status: Within functional limits for tasks assessed                  POSTURE:  Forward head and rounded shoulders posture  30 SEC SIT TO STAND: Unable to stand without UE support due to history of stroke with L sided weakness  SHOULDER AROM:    R shoulder ROM WFL, L impaired s/p stroke in 2011   CERVICAL AROM:   Percent limited  Flexion WFL  Extension WFL  Right lateral flexion WFL  Left lateral flexion WFL  Right rotation WFL  Left rotation WFL    (Blank rows=not tested)  GAIT: Assessed: Yes Assistance needed: Modified Independent Ambulation Distance: 10 feet Assistive Device: cane Gait pattern: Step-to and Decreased arm  swing L Ambulation surface: Level  PATIENT EDUCATION:  Education details: Neck ROM, importance of posture when sitting, standing and lying down, deep breathing, walking program and importance of staying active throughout treatment, CURE article on staying active, "Why exercise?" flyer, lymphedema and PT info Person educated: Patient Education method: Explanation, Demonstration, Handout Education comprehension: Patient verbalized understanding and returned demonstration  HOME EXERCISE PROGRAM: Patient was instructed today in a home exercise program today for head and neck range of motion exercises. These included active cervical flexion, active cervical extension, active  cervical rotation to each direction, upper trap stretch, and shoulder retraction. Patient was encouraged to do these 2-3 times a day, holding for 5 sec each and completing for 5 reps. Pt was educated that once this becomes easier then hold the stretches for 30-60 seconds.    ASSESSMENT:  CLINICAL IMPRESSION: Pt arrives to PT with recently diagnosed R tonsillar cancer. He will receive 35 fractions of radiation to his tonsil and right neck. He started on 10/17 and he will complete 12/6.  Pt's cervical ROM was [facility]. Educated pt about signs and symptoms of lymphedema as well as anatomy and physiology of lymphatic system. Educated pt in importance of staying as active as possible throughout treatment to decrease fatigue as well as head and neck ROM exercises to decrease loss of ROM. Will see pt after completion of radiation to reassess ROM and assess for lymphedema and to determine therapy needs at that time.  Pt will benefit from skilled therapeutic intervention to improve on the following deficits: Decreased knowledge of precautions and postural dysfunction.   PT treatment/interventions: ADL/self-care home management, pt/family education, therapeutic exercise.   REHAB POTENTIAL: Good  CLINICAL DECISION MAKING: Stable/uncomplicated  EVALUATION COMPLEXITY: Low   GOALS: Goals reviewed with patient? YES  LONG TERM GOALS: (STG=LTG)   Name Target Date  Goal status  1 Patient will be able to verbalize understanding of a home exercise program for cervical range of motion, posture, and walking.   Baseline:  No knowledge 09/21/2023 Achieved at eval  2 Patient will be able to verbalize understanding of proper sitting and standing posture. Baseline:  No knowledge 09/21/2023 Achieved at eval  3 Patient will be able to verbalize understanding of lymphedema risk and availability of treatment for this condition Baseline:  No knowledge 09/21/2023 Achieved at eval  4 Pt will demonstrate a return to full  cervical ROM and function post operatively compared to baselines and not demonstrate any signs or symptoms of lymphedema.  Baseline: See objective measurements taken today. 11/16/23 New    PLAN:  PT FREQUENCY/DURATION: EVAL and 1 follow up appointment.   PLAN FOR NEXT SESSION: will reassess 2 weeks after completion of radiation to determine needs.  Patient will follow up at outpatient cancer rehab 2 weeks after completion of radiation.  If the patient requires physical therapy at that time, a specific plan will be dictated and sent to the referring physician for approval. The patient was educated today on appropriate basic range of motion exercises to begin now and continue throughout radiation and educated on the signs and symptoms of lymphedema. Patient verbalized good understanding.     Physical Therapy Information for During and After Head/Neck Cancer Treatment: Lymphedema is a swelling condition that you may be at risk for in your neck and/or face if you have radiation treatment to the area and/or if you have surgery that includes removing lymph nodes.  There is treatment available for this condition and it is not life-threatening.  Contact your physician or physical therapist with concerns. An excellent resource for those seeking information on lymphedema is the National Lymphedema Network's website.  It can be accessed at www.lymphnet.org If you notice swelling in your neck or face at any time following surgery (even if it is many years from now), please contact your doctor or physical therapist to discuss this.  Lymphedema can be treated at any time but it is easier for you if it is treated early on. If you have had surgery to your neck, please check with your surgeon about how soon to start doing neck range of motion exercises.  If you are not having surgery, I encourage you to start doing neck range of motion exercises today and continue these while undergoing treatment, UNLESS you have  irritation of your skin or soft tissue that is aggravated by doing them.  These exercises are intended to help you prevent loss of range of motion and/or to gain range of motion in your neck (which can be limited by tightening effects of radiation), and NOT to aggravate these tissues if they develop sensitivities from treatment. Neck range of motion exercises should be done to the point of feeling a GENTLE, TOLERABLE stretch only.  You are encouraged to start a walking or other exercise program tomorrow and continue this as much as you are able through and after treatment.  Please feel free to call me with any questions. Leonette Most, PT, CLT Physical Therapist and Certified Lymphedema Therapist [facility] [address], [address], [place], [place] [address] [phone] [email or web address]  WALKING  Walking is a great form of exercise to increase your strength, endurance and overall fitness.  A walking program can help you start slowly and gradually build endurance as you go.  Everyone's ability is different, so each person's starting point will be different.  You do not have to follow them exactly.  The are just samples. You should simply find out what's right for you and stick to that program.   In the beginning, you'll start off walking 2-3 times a day for short distances.  As you get stronger, you'll be walking further at just 1-2 times per day.  A. You Can Walk For A Certain Length Of Time Each Day    Walk 5 minutes 3 times per day.  Increase 2 minutes every 2 days (3 times per day).  Work up to 25-30 minutes (1-2 times per day).   Example:   Day 1-2 5 minutes 3 times per day   Day 7-8 12 minutes 2-3 times per day   Day 13-14 25 minutes 1-2 times per day  B. You Can Walk For a Certain Distance Each Day     Distance can be substituted for time.    Example:   3 trips to mailbox (at road)   3 trips to corner of block   3 trips around  the block  C. Go to local high school and use the track.    Walk for distance ____ around track  Or time ____ minutes  D. Walk ____ Jog ____ Run ___   Why exercise?  So many benefits! Here are SOME of them: Heart health, including raising your good cholesterol level and reducing heart rate and blood pressure Lung health, including improved lung capacity It burns fats, and most of [place] can stand to be leaner, whether or not we are overweight. It increases the body's natural painkillers and mood elevators, so makes you  feel better. Not only makes you feel better, but look better too Improves sleep Takes a bite out of stress May decrease your risk of many types of cancer If you are currently undergoing cancer treatment, exercise may improve your ability to tolerate treatments including chemotherapy. For everybody, it can improve your energy level. Those with cancer-related fatigue report a 40-50% reduction in this symptom when exercising regularly. If you are a survivor of breast, colon, or prostate cancer, it may decrease your risk of a recurrence. (This may hold for other cancers too, but so far we have data just for these three types.)  How to exercise: Get your doctor's okay. Pick something you enjoy doing, like walking, Zumba, biking, swimming, or whatever. Start at low intensity and time, then gradually increase.  (See walking program handout.) Set a goal to achieve over time.  The American Cancer Society, American Heart Association, and U.S. Dept. of Health and Human Services recommend 150 minutes of moderate exercise, 75 minutes of vigorous exercise, or a combination of both per week. This should be done in episodes at least 10 minutes long, spread throughout the week.  Need help being motivated? Pick something you enjoy doing, because you'll be more inclined to stick with that activity than something that feels like a chore. Do it with a friend so that you are accountable to each  other. Schedule it into your day. Place it on your calendar and keep that appointment just like you do any appointment that you make. Join an exercise group that meets at a specific time.  That way, you have to show up on time, and that makes it harder to procrastinate about doing your workout.  It also keeps you accountable--people begin to expect you to be there. Join a gym where you feel comfortable and not intimidated, at the right cost. Sign up for something that you'll need to be in shape for on a specific date, like a 1K or a 5K to walk or run, a 20 or 30 mile bike ride, a mud run or something like that. If the date is looming, you know you'll need to train to be ready for it.  An added benefit is that many of these are fundraisers for good causes. If you've already paid for a gym membership, group exercise class or event, you might as well work out, so you haven't wasted your money!    [employer], PT 09/21/2023, 10:40 AM

## 2023-09-21 NOTE — Progress Notes (Signed)
Oncology Nurse Navigator Documentation   I met with Mr. Cuartas, his wife, and daughter before head and neck clinic today. He is tolerating treatment well at this time. They all know to call me if they have any questions or concerns as he proceeds with his radiation treatment.   Hedda Slade RN, BSN, OCN Head & Neck Oncology Nurse Navigator [facility] at [facility] Phone # [phone]  Fax # [phone]

## 2023-09-21 NOTE — Progress Notes (Signed)
Telephone follow up completed with patient, wife and daughter over telephone.  Patient reports no changes in appetite or intake since last week. He is still tolerating a regular diet. Drinks 2 Ensure Complete supplements daily. Denies nausea, vomiting, constipation and diarrhea. Reports meeting with Speech Therapist today.  Weight on Nov 4: 219 Pounds, stable.  Nutrition Diagnosis: Food and Nutrition Related Knowledge Deficit, improved.  Intervention: Encouraged patient to continue regular diet as tolerated. Continue Ensure Complete or equivalent 2 times daily. Encouraged baking soda and salt water gargles. Lidocaine as needed.  Monitoring, Evaluation, Goals: Tolerate oral intake to minimize weight loss.  Next Visit" Thursday, Nov 14 by telephone.

## 2023-09-22 ENCOUNTER — Other Ambulatory Visit: Payer: Self-pay

## 2023-09-22 ENCOUNTER — Ambulatory Visit
Admission: RE | Admit: 2023-09-22 | Discharge: 2023-09-22 | Disposition: A | Discharge status: Home / Self Care | Payer: Medicare PPO | Source: Ambulatory Visit | Attending: Radiation Oncology | Admitting: Radiation Oncology

## 2023-09-22 DIAGNOSIS — Z510 Encounter for antineoplastic radiation therapy: Secondary | ICD-10-CM | POA: Diagnosis not present

## 2023-09-22 LAB — RAD ONC ARIA SESSION SUMMARY
Course Elapsed Days: 22
Plan Fractions Treated to Date: 17
Plan Prescribed Dose Per Fraction: 2 Gy
Plan Total Fractions Prescribed: 35
Plan Total Prescribed Dose: 70 Gy
Reference Point Dosage Given to Date: 34 Gy
Reference Point Session Dosage Given: 2 Gy
Session Number: 17

## 2023-09-25 ENCOUNTER — Ambulatory Visit
Admission: RE | Admit: 2023-09-25 | Discharge: 2023-09-25 | Disposition: A | Discharge status: Home / Self Care | Payer: Medicare PPO | Source: Ambulatory Visit | Attending: Radiation Oncology | Admitting: Radiation Oncology

## 2023-09-25 ENCOUNTER — Other Ambulatory Visit: Payer: Self-pay

## 2023-09-25 ENCOUNTER — Ambulatory Visit
Admission: RE | Admit: 2023-09-25 | Discharge: 2023-09-25 | Disposition: A | Discharge status: Home / Self Care | Payer: Medicare PPO | Source: Ambulatory Visit | Attending: Radiation Oncology

## 2023-09-25 DIAGNOSIS — Z510 Encounter for antineoplastic radiation therapy: Secondary | ICD-10-CM | POA: Diagnosis not present

## 2023-09-25 LAB — RAD ONC ARIA SESSION SUMMARY
Course Elapsed Days: 25
Plan Fractions Treated to Date: 18
Plan Prescribed Dose Per Fraction: 2 Gy
Plan Total Fractions Prescribed: 35
Plan Total Prescribed Dose: 70 Gy
Reference Point Dosage Given to Date: 36 Gy
Reference Point Session Dosage Given: 2 Gy
Session Number: 18

## 2023-09-26 ENCOUNTER — Ambulatory Visit
Admission: RE | Admit: 2023-09-26 | Discharge: 2023-09-26 | Disposition: A | Discharge status: Home / Self Care | Payer: Medicare PPO | Source: Ambulatory Visit | Attending: Radiation Oncology | Admitting: Radiation Oncology

## 2023-09-26 ENCOUNTER — Other Ambulatory Visit: Payer: Self-pay

## 2023-09-26 DIAGNOSIS — Z510 Encounter for antineoplastic radiation therapy: Secondary | ICD-10-CM | POA: Diagnosis not present

## 2023-09-26 LAB — RAD ONC ARIA SESSION SUMMARY
Course Elapsed Days: 26
Plan Fractions Treated to Date: 19
Plan Prescribed Dose Per Fraction: 2 Gy
Plan Total Fractions Prescribed: 35
Plan Total Prescribed Dose: 70 Gy
Reference Point Dosage Given to Date: 38 Gy
Reference Point Session Dosage Given: 2 Gy
Session Number: 19

## 2023-09-27 ENCOUNTER — Other Ambulatory Visit: Payer: Self-pay

## 2023-09-27 ENCOUNTER — Ambulatory Visit
Admission: RE | Admit: 2023-09-27 | Discharge: 2023-09-27 | Disposition: A | Discharge status: Home / Self Care | Payer: Medicare PPO | Source: Ambulatory Visit | Attending: Radiation Oncology | Admitting: Radiation Oncology

## 2023-09-27 DIAGNOSIS — Z510 Encounter for antineoplastic radiation therapy: Secondary | ICD-10-CM | POA: Diagnosis not present

## 2023-09-27 LAB — RAD ONC ARIA SESSION SUMMARY
Course Elapsed Days: 27
Plan Fractions Treated to Date: 20
Plan Prescribed Dose Per Fraction: 2 Gy
Plan Total Fractions Prescribed: 35
Plan Total Prescribed Dose: 70 Gy
Reference Point Dosage Given to Date: 40 Gy
Reference Point Session Dosage Given: 2 Gy
Session Number: 20

## 2023-09-28 ENCOUNTER — Ambulatory Visit
Admission: RE | Admit: 2023-09-28 | Discharge: 2023-09-28 | Disposition: A | Discharge status: Home / Self Care | Payer: Medicare PPO | Source: Ambulatory Visit | Attending: Radiation Oncology | Admitting: Radiation Oncology

## 2023-09-28 ENCOUNTER — Inpatient Hospital Stay: Payer: Medicare PPO

## 2023-09-28 ENCOUNTER — Other Ambulatory Visit: Payer: Self-pay

## 2023-09-28 DIAGNOSIS — Z510 Encounter for antineoplastic radiation therapy: Secondary | ICD-10-CM | POA: Diagnosis not present

## 2023-09-28 LAB — RAD ONC ARIA SESSION SUMMARY
Course Elapsed Days: 28
Plan Fractions Treated to Date: 21
Plan Prescribed Dose Per Fraction: 2 Gy
Plan Total Fractions Prescribed: 35
Plan Total Prescribed Dose: 70 Gy
Reference Point Dosage Given to Date: 42 Gy
Reference Point Session Dosage Given: 2 Gy
Session Number: 21

## 2023-09-28 NOTE — Progress Notes (Signed)
Nutrition Follow-up:  Patient with SCC of right tonsil, p 16 positive.  Patient is receiving radiation only.  Does not have a PEG tube.   Spoke with patient via phone for nutrition follow-up.  Reports that his appetite is decreased.  Drinking about 2 ensure complete shakes a day but getting tired of them.  Ate macaroni and cheese and applesauce last night for dinner.  Ate grits, eggs and banana for brunch today.  Yesterday ate 3 eggs, grits and bacon for brunch.  He is using the baking soda and salt water rinse and lidocaine. Denies nausea    Medications: lidocaine  Labs: reviewed  Anthropometrics:   Weight 215 lb 8 oz per [facility] on 11/11 219 lb on 11/4 224 lb 8 oz on 10/4  4% weight loss in the last month, concerning  NUTRITION DIAGNOSIS: Unintentional weight loss related to cancer treatment as evidenced by    INTERVENTION:  Discussed other soft foods to try (pimento cheese, chicken salad, etc) Discussed mixing ice cream in ensure shake to change the flavor and add calories.  Recommend increasing shake to TID at least with weight loss Continue using baking soda, salt water rinse and lidocaine    MONITORING, EVALUATION, GOAL: weight trends, intake   NEXT VISIT: Tuesday, Nov 19 phone  Jalaila Caradonna B. Freida Busman, RD, LDN Registered Dietitian [phone]

## 2023-09-29 ENCOUNTER — Ambulatory Visit
Admission: RE | Admit: 2023-09-29 | Discharge: 2023-09-29 | Disposition: A | Discharge status: Home / Self Care | Payer: Medicare PPO | Source: Ambulatory Visit | Attending: Radiation Oncology | Admitting: Radiation Oncology

## 2023-09-29 ENCOUNTER — Other Ambulatory Visit: Payer: Self-pay

## 2023-09-29 DIAGNOSIS — Z510 Encounter for antineoplastic radiation therapy: Secondary | ICD-10-CM | POA: Diagnosis not present

## 2023-09-29 LAB — RAD ONC ARIA SESSION SUMMARY
Course Elapsed Days: 29
Plan Fractions Treated to Date: 22
Plan Prescribed Dose Per Fraction: 2 Gy
Plan Total Fractions Prescribed: 35
Plan Total Prescribed Dose: 70 Gy
Reference Point Dosage Given to Date: 44 Gy
Reference Point Session Dosage Given: 2 Gy
Session Number: 22

## 2023-10-02 ENCOUNTER — Other Ambulatory Visit: Payer: Self-pay

## 2023-10-02 ENCOUNTER — Ambulatory Visit
Admission: RE | Admit: 2023-10-02 | Discharge: 2023-10-02 | Disposition: A | Discharge status: Home / Self Care | Payer: Medicare PPO | Source: Ambulatory Visit | Attending: Radiation Oncology

## 2023-10-02 ENCOUNTER — Inpatient Hospital Stay: Payer: Medicare PPO

## 2023-10-02 ENCOUNTER — Ambulatory Visit: Payer: Medicare PPO

## 2023-10-02 DIAGNOSIS — Z510 Encounter for antineoplastic radiation therapy: Secondary | ICD-10-CM | POA: Diagnosis not present

## 2023-10-02 LAB — RAD ONC ARIA SESSION SUMMARY
Course Elapsed Days: 32
Plan Fractions Treated to Date: 23
Plan Prescribed Dose Per Fraction: 2 Gy
Plan Total Fractions Prescribed: 35
Plan Total Prescribed Dose: 70 Gy
Reference Point Dosage Given to Date: 46 Gy
Reference Point Session Dosage Given: 2 Gy
Session Number: 23

## 2023-10-02 NOTE — Progress Notes (Signed)
Nutrition Follow-up:  Patient with SCC of right tonsil, p 16+.  Patient is receiving radiation only.  Does not have a PEG tube.    Spoke with wife and patient via phone.  Reports intake has decreased over the weekend.  More blistering on skin.  Has been drinking 2 of the ensure complete shakes but says that he is tired of them.  Ate some ice cream, few bites of cereal, eggs, grits, hamburger over the weekend.  Has been using baking soda and salt water rinses along with lidocaine.      Medications: reviewed    Anthropometrics:   Weight no new 215 lb 8 oz per [facility] on 11/11 219 lb on 11/4 224 lb 8 oz on 10/4 4% weight loss in the last month, concerning  NUTRITION DIAGNOSIS: Unintentional weight loss ongoing   INTERVENTION:  Recommend increase ensure complete to TID at least  Reviewed ways to add calories and protein in diet.     MONITORING, EVALUATION, GOAL: weight trends, intake   NEXT VISIT: Tuesday, Nov 26 phone call  Teyon Odette B. Freida Busman, RD, LDN Registered Dietitian [phone]

## 2023-10-03 ENCOUNTER — Ambulatory Visit
Admission: RE | Admit: 2023-10-03 | Discharge: 2023-10-03 | Disposition: A | Discharge status: Home / Self Care | Payer: Medicare PPO | Source: Ambulatory Visit | Attending: Radiation Oncology | Admitting: Radiation Oncology

## 2023-10-03 ENCOUNTER — Encounter: Payer: Medicare PPO | Admitting: Nutrition

## 2023-10-03 ENCOUNTER — Other Ambulatory Visit: Payer: Self-pay

## 2023-10-03 ENCOUNTER — Inpatient Hospital Stay: Payer: Medicare PPO | Admitting: Nutrition

## 2023-10-03 DIAGNOSIS — Z510 Encounter for antineoplastic radiation therapy: Secondary | ICD-10-CM | POA: Diagnosis not present

## 2023-10-03 LAB — RAD ONC ARIA SESSION SUMMARY
Course Elapsed Days: 33
Plan Fractions Treated to Date: 24
Plan Prescribed Dose Per Fraction: 2 Gy
Plan Total Fractions Prescribed: 35
Plan Total Prescribed Dose: 70 Gy
Reference Point Dosage Given to Date: 48 Gy
Reference Point Session Dosage Given: 2 Gy
Session Number: 24

## 2023-10-04 ENCOUNTER — Ambulatory Visit
Admission: RE | Admit: 2023-10-04 | Discharge: 2023-10-04 | Disposition: A | Discharge status: Home / Self Care | Payer: Medicare PPO | Source: Ambulatory Visit | Attending: Radiation Oncology | Admitting: Radiation Oncology

## 2023-10-04 ENCOUNTER — Other Ambulatory Visit: Payer: Self-pay

## 2023-10-04 DIAGNOSIS — Z510 Encounter for antineoplastic radiation therapy: Secondary | ICD-10-CM | POA: Diagnosis not present

## 2023-10-04 LAB — RAD ONC ARIA SESSION SUMMARY
Course Elapsed Days: 34
Plan Fractions Treated to Date: 25
Plan Prescribed Dose Per Fraction: 2 Gy
Plan Total Fractions Prescribed: 35
Plan Total Prescribed Dose: 70 Gy
Reference Point Dosage Given to Date: 50 Gy
Reference Point Session Dosage Given: 2 Gy
Session Number: 25

## 2023-10-05 ENCOUNTER — Other Ambulatory Visit: Payer: Self-pay

## 2023-10-05 ENCOUNTER — Ambulatory Visit
Admission: RE | Admit: 2023-10-05 | Discharge: 2023-10-05 | Disposition: A | Discharge status: Home / Self Care | Payer: Medicare PPO | Source: Ambulatory Visit | Attending: Radiation Oncology | Admitting: Radiation Oncology

## 2023-10-05 DIAGNOSIS — Z510 Encounter for antineoplastic radiation therapy: Secondary | ICD-10-CM | POA: Diagnosis not present

## 2023-10-05 LAB — RAD ONC ARIA SESSION SUMMARY
Course Elapsed Days: 35
Plan Fractions Treated to Date: 26
Plan Prescribed Dose Per Fraction: 2 Gy
Plan Total Fractions Prescribed: 35
Plan Total Prescribed Dose: 70 Gy
Reference Point Dosage Given to Date: 52 Gy
Reference Point Session Dosage Given: 2 Gy
Session Number: 26

## 2023-10-06 ENCOUNTER — Ambulatory Visit
Admission: RE | Admit: 2023-10-06 | Discharge: 2023-10-06 | Disposition: A | Discharge status: Home / Self Care | Payer: Medicare PPO | Source: Ambulatory Visit | Attending: Radiation Oncology | Admitting: Radiation Oncology

## 2023-10-06 ENCOUNTER — Other Ambulatory Visit: Payer: Self-pay

## 2023-10-06 DIAGNOSIS — Z510 Encounter for antineoplastic radiation therapy: Secondary | ICD-10-CM | POA: Diagnosis not present

## 2023-10-06 LAB — RAD ONC ARIA SESSION SUMMARY
Course Elapsed Days: 36
Plan Fractions Treated to Date: 27
Plan Prescribed Dose Per Fraction: 2 Gy
Plan Total Fractions Prescribed: 35
Plan Total Prescribed Dose: 70 Gy
Reference Point Dosage Given to Date: 54 Gy
Reference Point Session Dosage Given: 2 Gy
Session Number: 27

## 2023-10-08 ENCOUNTER — Other Ambulatory Visit: Payer: Self-pay

## 2023-10-08 ENCOUNTER — Ambulatory Visit
Admission: RE | Admit: 2023-10-08 | Discharge: 2023-10-08 | Disposition: A | Discharge status: Home / Self Care | Payer: Medicare PPO | Source: Ambulatory Visit | Attending: Radiation Oncology | Admitting: Radiation Oncology

## 2023-10-08 DIAGNOSIS — Z510 Encounter for antineoplastic radiation therapy: Secondary | ICD-10-CM | POA: Diagnosis not present

## 2023-10-08 LAB — RAD ONC ARIA SESSION SUMMARY
Course Elapsed Days: 38
Plan Fractions Treated to Date: 28
Plan Prescribed Dose Per Fraction: 2 Gy
Plan Total Fractions Prescribed: 35
Plan Total Prescribed Dose: 70 Gy
Reference Point Dosage Given to Date: 56 Gy
Reference Point Session Dosage Given: 2 Gy
Session Number: 28

## 2023-10-09 ENCOUNTER — Other Ambulatory Visit: Payer: Self-pay

## 2023-10-09 ENCOUNTER — Ambulatory Visit: Payer: Medicare PPO

## 2023-10-09 ENCOUNTER — Ambulatory Visit
Admission: RE | Admit: 2023-10-09 | Discharge: 2023-10-09 | Disposition: A | Discharge status: Home / Self Care | Payer: Medicare PPO | Source: Ambulatory Visit | Attending: Radiation Oncology

## 2023-10-09 DIAGNOSIS — C090 Malignant neoplasm of tonsillar fossa: Secondary | ICD-10-CM

## 2023-10-09 DIAGNOSIS — Z510 Encounter for antineoplastic radiation therapy: Secondary | ICD-10-CM | POA: Diagnosis not present

## 2023-10-09 LAB — RAD ONC ARIA SESSION SUMMARY
Course Elapsed Days: 39
Plan Fractions Treated to Date: 29
Plan Prescribed Dose Per Fraction: 2 Gy
Plan Total Fractions Prescribed: 35
Plan Total Prescribed Dose: 70 Gy
Reference Point Dosage Given to Date: 58 Gy
Reference Point Session Dosage Given: 2 Gy
Session Number: 29

## 2023-10-09 MED ORDER — SONAFINE EX EMUL
1.0000 | Freq: Two times a day (BID) | CUTANEOUS | Status: DC
  Administered 2023-10-09: 1 via TOPICAL

## 2023-10-10 ENCOUNTER — Other Ambulatory Visit: Payer: Self-pay

## 2023-10-10 ENCOUNTER — Encounter: Payer: Self-pay | Admitting: Nutrition

## 2023-10-10 ENCOUNTER — Ambulatory Visit
Admission: RE | Admit: 2023-10-10 | Discharge: 2023-10-10 | Disposition: A | Discharge status: Home / Self Care | Payer: Medicare PPO | Source: Ambulatory Visit | Attending: Radiation Oncology | Admitting: Radiation Oncology

## 2023-10-10 ENCOUNTER — Inpatient Hospital Stay: Payer: Medicare PPO | Admitting: Nutrition

## 2023-10-10 DIAGNOSIS — Z510 Encounter for antineoplastic radiation therapy: Secondary | ICD-10-CM | POA: Diagnosis not present

## 2023-10-10 LAB — RAD ONC ARIA SESSION SUMMARY
Course Elapsed Days: 40
Plan Fractions Treated to Date: 30
Plan Prescribed Dose Per Fraction: 2 Gy
Plan Total Fractions Prescribed: 35
Plan Total Prescribed Dose: 70 Gy
Reference Point Dosage Given to Date: 60 Gy
Reference Point Session Dosage Given: 2 Gy
Session Number: 30

## 2023-10-10 NOTE — Progress Notes (Signed)
Patient's daughter contacted me by phone and left a message that she would like for her dad to be seen in person next week.  She states that he is not eating well.  I changed appointment to in person at her request.  I contacted patient by telephone today for scheduled nutrition follow-up.  Patient was unavailable.  I left my name and phone number for return call.  **Disclaimer: This note was dictated with voice recognition software. Similar sounding words can inadvertently be transcribed and this note may contain transcription errors which may not have been corrected upon publication of note.**

## 2023-10-11 ENCOUNTER — Other Ambulatory Visit: Payer: Self-pay

## 2023-10-11 ENCOUNTER — Ambulatory Visit
Admission: RE | Admit: 2023-10-11 | Discharge: 2023-10-11 | Disposition: A | Discharge status: Home / Self Care | Payer: Medicare PPO | Source: Ambulatory Visit | Attending: Radiation Oncology | Admitting: Radiation Oncology

## 2023-10-11 DIAGNOSIS — Z510 Encounter for antineoplastic radiation therapy: Secondary | ICD-10-CM | POA: Diagnosis not present

## 2023-10-11 LAB — RAD ONC ARIA SESSION SUMMARY
Course Elapsed Days: 41
Plan Fractions Treated to Date: 31
Plan Prescribed Dose Per Fraction: 2 Gy
Plan Total Fractions Prescribed: 35
Plan Total Prescribed Dose: 70 Gy
Reference Point Dosage Given to Date: 62 Gy
Reference Point Session Dosage Given: 2 Gy
Session Number: 31

## 2023-10-16 ENCOUNTER — Ambulatory Visit
Admission: RE | Admit: 2023-10-16 | Discharge: 2023-10-16 | Disposition: A | Discharge status: Home / Self Care | Payer: Medicare PPO | Source: Ambulatory Visit | Attending: Radiation Oncology | Admitting: Radiation Oncology

## 2023-10-16 ENCOUNTER — Other Ambulatory Visit: Payer: Self-pay | Admitting: Radiation Oncology

## 2023-10-16 ENCOUNTER — Other Ambulatory Visit: Payer: Self-pay

## 2023-10-16 ENCOUNTER — Ambulatory Visit: Payer: Medicare PPO

## 2023-10-16 DIAGNOSIS — C090 Malignant neoplasm of tonsillar fossa: Secondary | ICD-10-CM | POA: Diagnosis present

## 2023-10-16 LAB — RAD ONC ARIA SESSION SUMMARY
Course Elapsed Days: 46
Plan Fractions Treated to Date: 32
Plan Prescribed Dose Per Fraction: 2 Gy
Plan Total Fractions Prescribed: 35
Plan Total Prescribed Dose: 70 Gy
Reference Point Dosage Given to Date: 64 Gy
Reference Point Session Dosage Given: 2 Gy
Session Number: 32

## 2023-10-16 MED ORDER — HYDROCODONE-ACETAMINOPHEN 5-325 MG PO TABS
1.0000 | ORAL_TABLET | Freq: Four times a day (QID) | ORAL | 0 refills | Status: AC | PRN
Start: 2023-10-16 — End: ?

## 2023-10-17 ENCOUNTER — Ambulatory Visit
Admission: RE | Admit: 2023-10-17 | Discharge: 2023-10-17 | Disposition: A | Discharge status: Home / Self Care | Payer: Medicare PPO | Source: Ambulatory Visit | Attending: Radiation Oncology | Admitting: Radiation Oncology

## 2023-10-17 ENCOUNTER — Other Ambulatory Visit: Payer: Self-pay

## 2023-10-17 DIAGNOSIS — C090 Malignant neoplasm of tonsillar fossa: Secondary | ICD-10-CM | POA: Diagnosis not present

## 2023-10-17 LAB — RAD ONC ARIA SESSION SUMMARY
Course Elapsed Days: 47
Plan Fractions Treated to Date: 33
Plan Prescribed Dose Per Fraction: 2 Gy
Plan Total Fractions Prescribed: 35
Plan Total Prescribed Dose: 70 Gy
Reference Point Dosage Given to Date: 66 Gy
Reference Point Session Dosage Given: 2 Gy
Session Number: 33

## 2023-10-18 ENCOUNTER — Other Ambulatory Visit: Payer: Self-pay

## 2023-10-18 ENCOUNTER — Ambulatory Visit: Payer: Medicare PPO

## 2023-10-18 ENCOUNTER — Ambulatory Visit
Admission: RE | Admit: 2023-10-18 | Discharge: 2023-10-18 | Disposition: A | Discharge status: Home / Self Care | Payer: Medicare PPO | Source: Ambulatory Visit | Attending: Radiation Oncology | Admitting: Radiation Oncology

## 2023-10-18 DIAGNOSIS — C090 Malignant neoplasm of tonsillar fossa: Secondary | ICD-10-CM | POA: Diagnosis not present

## 2023-10-18 LAB — RAD ONC ARIA SESSION SUMMARY
Course Elapsed Days: 48
Plan Fractions Treated to Date: 34
Plan Prescribed Dose Per Fraction: 2 Gy
Plan Total Fractions Prescribed: 35
Plan Total Prescribed Dose: 70 Gy
Reference Point Dosage Given to Date: 68 Gy
Reference Point Session Dosage Given: 2 Gy
Session Number: 34

## 2023-10-19 ENCOUNTER — Encounter: Payer: Self-pay | Admitting: Radiation Oncology

## 2023-10-19 ENCOUNTER — Other Ambulatory Visit: Payer: Self-pay

## 2023-10-19 ENCOUNTER — Inpatient Hospital Stay: Payer: Medicare PPO

## 2023-10-19 ENCOUNTER — Inpatient Hospital Stay: Payer: Medicare PPO | Admitting: Nutrition

## 2023-10-19 ENCOUNTER — Ambulatory Visit
Admission: RE | Admit: 2023-10-19 | Discharge: 2023-10-19 | Disposition: A | Discharge status: Home / Self Care | Payer: Medicare PPO | Source: Ambulatory Visit | Attending: Radiation Oncology | Admitting: Radiation Oncology

## 2023-10-19 ENCOUNTER — Ambulatory Visit: Payer: Medicare PPO

## 2023-10-19 VITALS — BP 100/66 | HR 76 | Resp 18 | Wt 199.0 lb

## 2023-10-19 DIAGNOSIS — C090 Malignant neoplasm of tonsillar fossa: Secondary | ICD-10-CM | POA: Insufficient documentation

## 2023-10-19 DIAGNOSIS — Z87891 Personal history of nicotine dependence: Secondary | ICD-10-CM | POA: Insufficient documentation

## 2023-10-19 DIAGNOSIS — R8290 Unspecified abnormal findings in urine: Secondary | ICD-10-CM | POA: Insufficient documentation

## 2023-10-19 LAB — RAD ONC ARIA SESSION SUMMARY
Course Elapsed Days: 49
Plan Fractions Treated to Date: 35
Plan Prescribed Dose Per Fraction: 2 Gy
Plan Total Fractions Prescribed: 35
Plan Total Prescribed Dose: 70 Gy
Reference Point Dosage Given to Date: 70 Gy
Reference Point Session Dosage Given: 2 Gy
Session Number: 35

## 2023-10-19 LAB — BASIC METABOLIC PANEL - CANCER CENTER ONLY
Anion gap: 8 (ref 5–15)
BUN: 18 mg/dL (ref 8–23)
CO2: 24 mmol/L (ref 22–32)
Calcium: 9.4 mg/dL (ref 8.9–10.3)
Chloride: 106 mmol/L (ref 98–111)
Creatinine: 1.38 mg/dL — ABNORMAL HIGH (ref 0.61–1.24)
GFR, Estimated: 56 mL/min — ABNORMAL LOW (ref >60)
Glucose, Bld: 142 mg/dL — ABNORMAL HIGH (ref 70–99)
Potassium: 3.7 mmol/L (ref 3.5–5.1)
Sodium: 138 mmol/L (ref 135–145)

## 2023-10-19 MED ORDER — SODIUM CHLORIDE 0.9 % IV SOLN
INTRAVENOUS | Status: AC
Start: 2023-10-19 — End: 2023-10-19

## 2023-10-19 NOTE — Progress Notes (Signed)
Nutrition follow-up completed with patient and family during IV fluids.  Patient has his final radiation therapy today for SCC of right tonsil.  He does not have a feeding tube.  Weight decreased and documented as 199 pounds on December 5.  This is decreased from 224.5 pounds October 4.  This is an 11% weight loss over 2 months which is clinically significant.  Labs include glucose 142, creatinine 1.38.  Patient reports increased pain and thickened saliva.  Reports he has received pain medication but has not taken it yet.  Reports he can eat about 4 bites of food and then must stop.  Family reports patient will drink a milkshake but no longer will drink oral nutrition supplements.  He has been able to eat a little ice cream and oyster stew with crackers.  He prefers ice cold water and has increased water over the past 24 hours.  Experiencing taste alterations but having difficulty identifying how food tastes.  He just knows it is different and he does not like it.  Nutrition diagnosis: Unintentional weight loss continues.  Intervention: Encouraged patient to continue baking soda and salt water gargles prior to eating and first thing in the morning and last thing at night.  Try to increase liquids as tolerated.  Use cool mist humidifier overnight to add moisture to the air.  Nutrition facts sheet provided. Consider taking pain medication about 20 minutes before meal or snack.  Continue soft, moist high-calorie, high-protein foods. Educated on strategies to improve taste alterations and provided nutrition facts sheet.  Monitoring, evaluation, goals: Patient will work to increase calories protein and fluids to minimize further weight loss and promote healing.    Next visit:  Encouraged patient or family to contact RD with questions or concerns.Requested 2-week follow-up to be made by radiation oncology with MD appointment.  **Disclaimer: This note was dictated with voice recognition software.  Similar sounding words can inadvertently be transcribed and this note may contain transcription errors which may not have been corrected upon publication of note.**

## 2023-10-19 NOTE — Patient Instructions (Signed)

## 2023-10-19 NOTE — Progress Notes (Signed)
Oncology Nurse Navigator Documentation   I received a call from Mr. Clara wife and daughter yesterday reporting that they believed he was becoming dehydrated due to his cancer treatment for head and neck cancer. They reported that he was taking in very little orally and his urine was dark. I notified Dr. Basilio Cairo and she has ordered that he begin to receive IVF's 3X week for 5 weeks with labs once weekly starting today. He has been scheduled for IVF's today with a lab beforehand. I notified his daughter this morning that he needs to be here at 1:00 for his appointments today and they were agreeable and appreciative of the help. His IVF's will be scheduled weekly in infusion by their charge nurse.  Hedda Slade RN, BSN, OCN Head & Neck Oncology Nurse Navigator [facility] at [facility] Phone # [phone]  Fax # [phone]

## 2023-10-19 NOTE — Progress Notes (Signed)
Oncology Nurse Navigator Documentation   Met with Arthur Cisneros after final RT to offer support and to celebrate end of radiation treatment.   Provided verbal post-RT guidance: Importance of keeping all follow-up appts, especially those with Nutrition and SLP. Importance of protecting treatment area from sun. Continuation of Sonafine/ Silvadene application 2 times daily, application of antibiotic ointment to areas of raw skin; when supply of Sonafine exhausted transition to OTC lotion with vitamin E.  Explained my role as navigator will continue for several more months, encouraged him to call me with needs/concerns.    Hedda Slade RN, BSN, OCN Head & Neck Oncology Nurse Navigator [facility] at [facility] Phone # [phone]  Fax # [phone]

## 2023-10-20 ENCOUNTER — Ambulatory Visit: Payer: Medicare PPO

## 2023-10-20 NOTE — Radiation Completion Notes (Signed)
Patient Name: Arthur Cisneros, Arthur Cisneros MRN: [id number] Date of Birth: 04/21/1956 Referring Physician: Christia Reading, M.D. Date of Service: 2023-10-20 Radiation Oncologist: Lonie Peak, M.D. 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 Staging on 2023-08-08: Primary cancer of tonsillar fossa (HCC) T=cT2, N=cN1, M=cM0 Intent: Curative     ==========DELIVERED PLANS==========  First Treatment Date: 2023-08-31 Last Treatment Date: 2023-10-19   Plan Name: HN_R_tonsil Site: Tonsil, Right Technique: IMRT Mode: Photon Dose Per Fraction: 2 Gy Prescribed Dose (Delivered / Prescribed): 70 Gy / 70 Gy Prescribed Fxs (Delivered / Prescribed): 35 / 35     ==========ON TREATMENT VISIT DATES========== 2023-09-04, 2023-09-11, 2023-09-18, 2023-09-25, 2023-10-02, 2023-10-09, 2023-10-16     ==========UPCOMING VISITS========== 11/16/2023 [facility] AT [employer], PT  11/03/2023 [facility] ONCOLOGY NUT 45 Noreene Larsson, [place]  11/03/2023 [facility]RADI[facility] ONC FOLLOW UP 20 Lonie Peak, MD  10/27/2023 [facility] ONCOLOGY INFUSION 2HR30MIN (150) [facility] INFUSION  10/26/2023 [facility] TREATMENT Barron Alvine, CCC-SLP  10/25/2023 [facility] ONCOLOGY INFUSION 2HR30MIN (150) [facility] INFUSION  10/23/2023 [facility]C LAB [facility]  10/23/2023 [facility] INFUSION 2HR30MIN (150) [facility] INFUSION  10/21/2023 [facility]COL[facility]Y INFUSION 2HR30MIN (150) [facility] INFUSION        ==========APPENDIX - ON TREATMENT VISIT NOTES==========   See weekly On Treatment Notes in Epic for details in the Media tab (listed as Progress notes on the On Treatment Visit Dates listed above).

## 2023-10-20 NOTE — Progress Notes (Signed)
Oncology Nurse Navigator Documentation   I called and spoke to Arthur Cisneros daughter, Arthur Cisneros. I let her know that he is scheduled for IVF's at 10:30 tomorrow and she was grateful and agreeable to the appointment. I also let her know that he has been scheduled for his IVF's next week as well. She knows to call me if she has any questions or concerns.   Hedda Slade RN, BSN, OCN Head & Neck Oncology Nurse Navigator [facility] at [facility] Phone # [phone]  Fax # [phone]

## 2023-10-21 ENCOUNTER — Inpatient Hospital Stay: Payer: Medicare PPO

## 2023-10-21 VITALS — BP 119/70 | HR 65 | Temp 97.6°F | Resp 17

## 2023-10-21 DIAGNOSIS — C090 Malignant neoplasm of tonsillar fossa: Secondary | ICD-10-CM

## 2023-10-21 MED ORDER — SODIUM CHLORIDE 0.9 % IV SOLN: INTRAVENOUS | Status: AC

## 2023-10-21 NOTE — Patient Instructions (Signed)
Dehydration, Adult Dehydration is a condition in which there is not enough water or other fluids in the body. This happens when a person loses more fluids than they take in. Important organs cannot work right without the right amount of fluids. Any loss of fluids from the body can cause dehydration. Dehydration can be mild, worse, or very bad. It should be treated right away to keep it from getting very bad. What are the causes? Conditions that cause loss of water in the body. They include: Watery poop (diarrhea). Vomiting. Sweating a lot. Fever. Infection. Peeing (urinating) a lot. Not drinking enough fluids. Certain medicines, such as medicines that take extra fluid out of the body (diuretics). Lack of safe drinking water. Not being able to get enough water and food. What increases the risk? Having a long-term (chronic) illness that has not been treated the right way, such as: Diabetes. Heart disease. Kidney disease. Being 65 years of age or older. Having a disability. Living in a place that is high above the ground or sea (high in altitude). The thinner, drier air causes more fluid loss. Doing exercises that put stress on your body for a long time. Being active when in hot places. What are the signs or symptoms? Symptoms of dehydration depend on how bad it is. Mild or worse dehydration Thirst. Dry lips or dry mouth. Feeling dizzy or light-headed. Muscle cramps. Passing little pee or dark pee. Pee may be the color of tea. Headache. Very bad dehydration Changes in skin. Skin may: Be cold to the touch (clammy). Be blotchy or pale. Not go back to normal right after you pinch it and let it go. Little or no tears, pee, or sweat. Fast breathing. Low blood pressure. Weak pulse. Pulse that is more than 100 beats a minute when you are sitting still. Other changes, such as: Feeling very thirsty. Eyes that look hollow (sunken). Cold hands and feet. Being confused. Being very  tired (lethargic) or having trouble waking from sleep. Losing weight. Loss of consciousness. How is this treated? Treatment for this condition depends on how bad your dehydration is. Treatment should start right away. Do not wait until your condition gets very bad. Very bad dehydration is an emergency. You will need to go to a hospital. Mild or worse dehydration can be treated at home. You may be asked to: Drink more fluids. Drink an oral rehydration solution (ORS). This drink gives you the right amount of fluids, salts, and minerals (electrolytes). Very bad dehydration can be treated: With fluids through an IV tube. By correcting low levels of electrolytes in the body. By treating the problem that caused your dehydration. Follow these instructions at home: Oral rehydration solution If told by your doctor, drink an ORS: Make an ORS. Use instructions on the package. Start by drinking small amounts, about  cup (120 mL) every 5-10 minutes. Slowly drink more until you have had the amount that your doctor said to have.  Eating and drinking  Drink enough clear fluid to keep your pee pale yellow. If you were told to drink an ORS, finish the ORS first. Then, start slowly drinking other clear fluids. Drink fluids such as: Water. Do not drink only water. Doing that can make the salt (sodium) level in your body get too low. Water from ice chips you suck on. Fruit juice that you have added water to (diluted). Low-calorie sports drinks. Eat foods that have the right amounts of salts and minerals, such as bananas, oranges, potatoes,   tomatoes, or spinach. Do not drink alcohol. Avoid drinks that have caffeine or sugar. These include:: High-calorie sports drinks. Fruit juice that you did not add water to. Soda. Coffee or energy drinks. Avoid foods that are greasy or have a lot of fat or sugar. General instructions Take over-the-counter and prescription medicines only as told by your doctor. Do  not take sodium tablets. Doing that can make the salt level in your body get too high. Return to your normal activities as told by your doctor. Ask your doctor what activities are safe for you. Keep all follow-up visits. Your doctor may check and change your treatment. Contact a doctor if: You have pain in your belly (abdomen) and the pain: Gets worse. Stays in one place. You have a rash. You have a stiff neck. You get angry or annoyed more easily than normal. You are more tired or have a harder time waking than normal. You feel weak or dizzy. You feel very thirsty. Get help right away if: You have any symptoms of very bad dehydration. You vomit every time you eat or drink. Your vomiting gets worse, does not go away, or you vomit blood or green stuff. You are getting treatment, but symptoms are getting worse. You have a fever. You have a very bad headache. You have: Diarrhea that gets worse or does not go away. Blood in your poop (stool). This may cause poop to look black and tarry. No pee in 6-8 hours. Only a small amount of pee in 6-8 hours, and the pee is very dark. You have trouble breathing. These symptoms may be an emergency. Get help right away. Call 911. Do not wait to see if the symptoms will go away. Do not drive yourself to the hospital. This information is not intended to replace advice given to you by your health care provider. Make sure you discuss any questions you have with your health care provider. Document Revised: 05/30/2022 Document Reviewed: 05/30/2022 Elsevier Patient Education  2024 Elsevier Inc.  

## 2023-10-23 ENCOUNTER — Ambulatory Visit
Admission: RE | Admit: 2023-10-23 | Discharge: 2023-10-23 | Disposition: A | Discharge status: Home / Self Care | Payer: Medicare PPO | Source: Ambulatory Visit | Attending: Internal Medicine | Admitting: Internal Medicine

## 2023-10-23 ENCOUNTER — Inpatient Hospital Stay: Payer: Medicare PPO

## 2023-10-23 VITALS — BP 112/71 | HR 64 | Resp 17

## 2023-10-23 DIAGNOSIS — C090 Malignant neoplasm of tonsillar fossa: Secondary | ICD-10-CM

## 2023-10-23 LAB — BASIC METABOLIC PANEL - CANCER CENTER ONLY
Anion gap: 10 (ref 5–15)
BUN: 13 mg/dL (ref 8–23)
CO2: 24 mmol/L (ref 22–32)
Calcium: 8.7 mg/dL — ABNORMAL LOW (ref 8.9–10.3)
Chloride: 106 mmol/L (ref 98–111)
Creatinine: 1.16 mg/dL (ref 0.61–1.24)
GFR, Estimated: >60 mL/min (ref >60)
Glucose, Bld: 136 mg/dL — ABNORMAL HIGH (ref 70–99)
Potassium: 3.4 mmol/L — ABNORMAL LOW (ref 3.5–5.1)
Sodium: 140 mmol/L (ref 135–145)

## 2023-10-23 MED ORDER — SODIUM CHLORIDE 0.9 % IV SOLN
INTRAVENOUS | Status: AC
Start: 2023-10-23 — End: 2023-10-23

## 2023-10-23 NOTE — Patient Instructions (Signed)
Dehydration, Adult Dehydration is a condition in which there is not enough water or other fluids in the body. This happens when a person loses more fluids than they take in. Important organs cannot work right without the right amount of fluids. Any loss of fluids from the body can cause dehydration. Dehydration can be mild, worse, or very bad. It should be treated right away to keep it from getting very bad. What are the causes? Conditions that cause loss of water in the body. They include: Watery poop (diarrhea). Vomiting. Sweating a lot. Fever. Infection. Peeing (urinating) a lot. Not drinking enough fluids. Certain medicines, such as medicines that take extra fluid out of the body (diuretics). Lack of safe drinking water. Not being able to get enough water and food. What increases the risk? Having a long-term (chronic) illness that has not been treated the right way, such as: Diabetes. Heart disease. Kidney disease. Being 65 years of age or older. Having a disability. Living in a place that is high above the ground or sea (high in altitude). The thinner, drier air causes more fluid loss. Doing exercises that put stress on your body for a long time. Being active when in hot places. What are the signs or symptoms? Symptoms of dehydration depend on how bad it is. Mild or worse dehydration Thirst. Dry lips or dry mouth. Feeling dizzy or light-headed. Muscle cramps. Passing little pee or dark pee. Pee may be the color of tea. Headache. Very bad dehydration Changes in skin. Skin may: Be cold to the touch (clammy). Be blotchy or pale. Not go back to normal right after you pinch it and let it go. Little or no tears, pee, or sweat. Fast breathing. Low blood pressure. Weak pulse. Pulse that is more than 100 beats a minute when you are sitting still. Other changes, such as: Feeling very thirsty. Eyes that look hollow (sunken). Cold hands and feet. Being confused. Being very  tired (lethargic) or having trouble waking from sleep. Losing weight. Loss of consciousness. How is this treated? Treatment for this condition depends on how bad your dehydration is. Treatment should start right away. Do not wait until your condition gets very bad. Very bad dehydration is an emergency. You will need to go to a hospital. Mild or worse dehydration can be treated at home. You may be asked to: Drink more fluids. Drink an oral rehydration solution (ORS). This drink gives you the right amount of fluids, salts, and minerals (electrolytes). Very bad dehydration can be treated: With fluids through an IV tube. By correcting low levels of electrolytes in the body. By treating the problem that caused your dehydration. Follow these instructions at home: Oral rehydration solution If told by your doctor, drink an ORS: Make an ORS. Use instructions on the package. Start by drinking small amounts, about  cup (120 mL) every 5-10 minutes. Slowly drink more until you have had the amount that your doctor said to have.  Eating and drinking  Drink enough clear fluid to keep your pee pale yellow. If you were told to drink an ORS, finish the ORS first. Then, start slowly drinking other clear fluids. Drink fluids such as: Water. Do not drink only water. Doing that can make the salt (sodium) level in your body get too low. Water from ice chips you suck on. Fruit juice that you have added water to (diluted). Low-calorie sports drinks. Eat foods that have the right amounts of salts and minerals, such as bananas, oranges, potatoes,   tomatoes, or spinach. Do not drink alcohol. Avoid drinks that have caffeine or sugar. These include:: High-calorie sports drinks. Fruit juice that you did not add water to. Soda. Coffee or energy drinks. Avoid foods that are greasy or have a lot of fat or sugar. General instructions Take over-the-counter and prescription medicines only as told by your doctor. Do  not take sodium tablets. Doing that can make the salt level in your body get too high. Return to your normal activities as told by your doctor. Ask your doctor what activities are safe for you. Keep all follow-up visits. Your doctor may check and change your treatment. Contact a doctor if: You have pain in your belly (abdomen) and the pain: Gets worse. Stays in one place. You have a rash. You have a stiff neck. You get angry or annoyed more easily than normal. You are more tired or have a harder time waking than normal. You feel weak or dizzy. You feel very thirsty. Get help right away if: You have any symptoms of very bad dehydration. You vomit every time you eat or drink. Your vomiting gets worse, does not go away, or you vomit blood or green stuff. You are getting treatment, but symptoms are getting worse. You have a fever. You have a very bad headache. You have: Diarrhea that gets worse or does not go away. Blood in your poop (stool). This may cause poop to look black and tarry. No pee in 6-8 hours. Only a small amount of pee in 6-8 hours, and the pee is very dark. You have trouble breathing. These symptoms may be an emergency. Get help right away. Call 911. Do not wait to see if the symptoms will go away. Do not drive yourself to the hospital. This information is not intended to replace advice given to you by your health care provider. Make sure you discuss any questions you have with your health care provider. Document Revised: 05/30/2022 Document Reviewed: 05/30/2022 Elsevier Patient Education  2024 Elsevier Inc.  

## 2023-10-25 ENCOUNTER — Inpatient Hospital Stay: Payer: Medicare PPO

## 2023-10-25 VITALS — BP 146/82 | HR 76 | Temp 98.4°F

## 2023-10-25 DIAGNOSIS — C090 Malignant neoplasm of tonsillar fossa: Secondary | ICD-10-CM

## 2023-10-25 MED ORDER — SODIUM CHLORIDE 0.9 % IV SOLN: INTRAVENOUS | Status: AC

## 2023-10-27 ENCOUNTER — Inpatient Hospital Stay: Payer: Medicare PPO

## 2023-10-27 VITALS — BP 141/82 | HR 67 | Temp 98.4°F | Resp 18

## 2023-10-27 DIAGNOSIS — C090 Malignant neoplasm of tonsillar fossa: Secondary | ICD-10-CM | POA: Diagnosis not present

## 2023-10-27 MED ORDER — SODIUM CHLORIDE 0.9 % IV SOLN: INTRAVENOUS | Status: AC

## 2023-10-27 NOTE — Patient Instructions (Signed)

## 2023-10-30 ENCOUNTER — Inpatient Hospital Stay: Payer: Medicare PPO

## 2023-10-30 VITALS — BP 132/82 | HR 69 | Temp 98.5°F | Resp 18

## 2023-10-30 DIAGNOSIS — C090 Malignant neoplasm of tonsillar fossa: Secondary | ICD-10-CM | POA: Diagnosis not present

## 2023-10-30 MED ORDER — SODIUM CHLORIDE 0.9 % IV SOLN
INTRAVENOUS | Status: AC
Start: 2023-10-30 — End: 2023-10-30

## 2023-10-30 NOTE — Patient Instructions (Signed)

## 2023-11-01 ENCOUNTER — Inpatient Hospital Stay: Payer: Medicare PPO

## 2023-11-01 VITALS — BP 139/85 | HR 69 | Temp 98.0°F | Resp 18

## 2023-11-01 DIAGNOSIS — C090 Malignant neoplasm of tonsillar fossa: Secondary | ICD-10-CM

## 2023-11-01 MED ORDER — SODIUM CHLORIDE 0.9 % IV SOLN: INTRAVENOUS | Status: AC

## 2023-11-01 NOTE — Patient Instructions (Signed)

## 2023-11-03 ENCOUNTER — Ambulatory Visit
Admission: RE | Admit: 2023-11-03 | Discharge: 2023-11-03 | Disposition: A | Discharge status: Home / Self Care | Payer: Medicare PPO | Source: Ambulatory Visit | Attending: Radiation Oncology | Admitting: Radiation Oncology

## 2023-11-03 ENCOUNTER — Inpatient Hospital Stay: Payer: Medicare PPO

## 2023-11-03 ENCOUNTER — Other Ambulatory Visit: Payer: Self-pay

## 2023-11-03 ENCOUNTER — Inpatient Hospital Stay: Payer: Medicare PPO | Admitting: Dietician

## 2023-11-03 VITALS — BP 132/78 | HR 78 | Temp 98.2°F | Resp 18

## 2023-11-03 VITALS — BP 130/91 | HR 73 | Temp 97.3°F | Resp 20 | Ht 69.0 in | Wt 200.0 lb

## 2023-11-03 DIAGNOSIS — Z7902 Long term (current) use of antithrombotics/antiplatelets: Secondary | ICD-10-CM | POA: Insufficient documentation

## 2023-11-03 DIAGNOSIS — C090 Malignant neoplasm of tonsillar fossa: Secondary | ICD-10-CM

## 2023-11-03 DIAGNOSIS — Z923 Personal history of irradiation: Secondary | ICD-10-CM | POA: Insufficient documentation

## 2023-11-03 DIAGNOSIS — Z7984 Long term (current) use of oral hypoglycemic drugs: Secondary | ICD-10-CM | POA: Insufficient documentation

## 2023-11-03 DIAGNOSIS — R1310 Dysphagia, unspecified: Secondary | ICD-10-CM | POA: Insufficient documentation

## 2023-11-03 DIAGNOSIS — Z79899 Other long term (current) drug therapy: Secondary | ICD-10-CM | POA: Insufficient documentation

## 2023-11-03 MED ORDER — SODIUM CHLORIDE 0.9 % IV SOLN
INTRAVENOUS | Status: AC
Start: 2023-11-03 — End: 2023-11-03

## 2023-11-03 NOTE — Progress Notes (Signed)
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 Final RT 12/5  Met with patient in radiation clinic alongside Dr. Basilio Cairo. Patient wife and daughter present at visit. Patient reports ongoing thick saliva. He has no appetite. Nothing taste good. He endorses dysphagia with solids, however tolerates bacon. Patient has not been doing swallowing exercises. Last appointment with SLP was cancelled by patient. Daughter reports he had a single french fry yesterday. Patient no longer drinking Ensure and declines to resume due to thickness and taste. He is doing baking soda salt water rinses.    Medications: reviewed   Labs: no new labs  Anthropometrics: Wt 200 lb today - stable (fluids)  12/5 - 199 lb    NUTRITION DIAGNOSIS: Unintentional wt loss continues   INTERVENTION:  Encouraged pt to continue trying foods, recommend small frequent meals with high calorie high protein foods Patient agreeable to trying juice type supplement - samples of Boost Breeze, Ensure Clear + coupons provided  Continue baking soda salt water rinses  Pt agreeable to resume ST in [place] as this is closer to home - Dr. Basilio Cairo has placed orders    MONITORING, EVALUATION, GOAL: wt trends, intake    NEXT VISIT: Friday January 3 in radiation clinic

## 2023-11-03 NOTE — Progress Notes (Signed)
Radiation Oncology         [phone]) [phone] ________________________________  Name: Arthur Cisneros. MRN: [id number]  Date: 11/03/2023  DOB: 1956-05-13  Follow-Up Visit Note  CC: Inc, [facility]  [place], Karren Burly, MD  Diagnosis and Prior Radiotherapy:       ICD-10-CM   1. Malignant neoplasm of tonsillar fossa (HCC)  C09.0     2. Primary cancer of tonsillar fossa (HCC)  C09.0      Plan Name: HN_R_tonsil Site: Tonsil, Right Technique: IMRT Mode: Photon Dose Per Fraction: 2 Gy Prescribed Dose (Delivered / Prescribed): 70 Gy / 70 Gy Prescribed Fxs (Delivered / Prescribed): 35 / 35   CHIEF COMPLAINT:  Here for follow-up and surveillance of throat cancer  Narrative:   Arthur Cisneros presents today for follow-up after completing radiation to his right tonsil on 12/05/2024The patient returns today for routine follow-up. He has been maintaining his weight but struggles with solid oral intake. Does manage okay with sweet drinks and his family states he is taking in quite a bit of clear fluid.  He reports that food does not taste good, and he often feels like he's choking when he eats. Despite these issues, he has been able to maintain his weight without the need for a feeding tube. The patient has been refusing to attend swallowing therapy, which has been recommended to prevent future swallowing problems. He has full dentures and has been dealing with some swelling in the neck. He is also ambulating with a cane.    Using a feeding tube?: N/A Weight changes, if any:  Wt Readings from Last 3 Encounters:  11/03/23 200 lb (90.7 kg)  10/19/23 199 lb (90.3 kg)  08/18/23 224 lb 8 oz (101.8 kg)   Swallowing issues, if any: Denies, but reports he doesn't have an appetite and therefore is not eating much Smoking or chewing tobacco? None Using fluoride toothpaste daily? N/A--full set of dentures  Last ENT visit was on: Not since diagnosis Other notable issues, if any: Continues to deal with  dry mouth and thick saliva. Skin appears intact and well healed in treatment field.                         ALLERGIES:  is allergic to 5-alpha reductase inhibitors, lyrica [pregabalin], motrin [ibuprofen], and tramadol.  Meds: Current Outpatient Medications  Medication Sig Dispense Refill   acetaminophen (TYLENOL) 500 MG tablet Take 1,000 mg by mouth every 6 (six) hours as needed (pain).      amLODipine (NORVASC) 5 MG tablet Take 5 mg by mouth daily.     atorvastatin (LIPITOR) 40 MG tablet Take 40 mg by mouth at bedtime.      cholecalciferol (VITAMIN D) 25 MCG (1000 UNIT) tablet Take 1,000 Units by mouth daily.     clopidogrel (PLAVIX) 75 MG tablet Take 75 mg by mouth daily.     fluticasone (FLONASE) 50 MCG/ACT nasal spray Place 1 spray into both nostrils daily.     HYDROcodone-acetaminophen (NORCO/VICODIN) 5-325 MG tablet Take 1 tablet by mouth 4 (four) times daily as needed for moderate pain (pain score 4-6). 20 tablet 0   levETIRAcetam (KEPPRA) 250 MG tablet Take 1 tablet (250 mg total) by mouth 2 (two) times daily. 180 tablet 3   lidocaine (XYLOCAINE) 2 % solution Patient: Mix 1part 2% viscous lidocaine, 1part H20. Swish & swallow 10mL of diluted mixture, before meals and at bedtime, up to QID 200 mL  3   losartan (COZAAR) 25 MG tablet Take 25 mg by mouth daily.     metFORMIN (GLUCOPHAGE-XR) 500 MG 24 hr tablet Take 1,000 mg by mouth 2 (two) times a day.      nitroGLYCERIN (NITROSTAT) 0.4 MG SL tablet DISSOLVE (1) TABLET UNDER TONGUE AS NEEDED TO RELIEVE CHEST PAIN. MAY REPEAT EVERY 5 MINUTES. (if no relief after 2nd dose, proceed to the ED for an evaluation or call 911). 25 tablet 3   tiZANidine (ZANAFLEX) 2 MG tablet TAKE 1 OR 2 TABLETS BY MOUTH AT BEDTIME AS NEEDED 90 tablet 3   vitamin B-12 (CYANOCOBALAMIN) 1000 MCG tablet Take 1,000 mcg by mouth daily.     ZETIA 10 MG tablet Take 10 mg by mouth daily.     No current facility-administered medications for this encounter.     Physical Findings: Gen: The patient is in no acute distress. Patient is alert and oriented. Wt Readings from Last 3 Encounters:  11/03/23 200 lb (90.7 kg)  10/19/23 199 lb (90.3 kg)  08/18/23 224 lb 8 oz (101.8 kg)    height is 5\' 9"  (1.753 m) and weight is 200 lb (90.7 kg). His temperature is 97.3 F (36.3 C) (abnormal). His blood pressure is 130/91 (abnormal) and his pulse is 73. His respiration is 20 and oxygen saturation is 98%. Marland Kitchen    HEENT: Full dentures present. Healing mucosa (confluent mucositis) over uvula, soft palate, and right tonsil area. No thrush or signs of tumor. NECK:moderate fullness in level 2 area, possibly scar tissue from previous lymph node treatment. CHEST: Lungs clear to auscultation bilaterally. CARDIOVASCULAR: Heart regular rate and rhythm. ABDOMEN: Abdomen soft, no feeding tube present. SKIN: Skin over neck healed, it is smooth with some residual erythema Neuro: weakness on left side, in a wheelchair  Lab Findings: Lab Results  Component Value Date   WBC 8.5 08/10/2023   HGB 15.1 08/10/2023   HCT 44.6 08/10/2023   MCV 90.8 08/10/2023   PLT 240 08/10/2023    Lab Results  Component Value Date   TSH 1.413 08/10/2023    Radiographic Findings: No results found.  Impression/Plan:   Cancer Staging  Primary cancer of tonsillar fossa (HCC) Staging form: Pharynx - HPV-Mediated Oropharynx, AJCC 8th Edition - Clinical stage from 08/08/2023: Stage I (cT2, cN1, cM0, p16+) - Signed by Lonie Peak, MD on 08/09/2023 Stage prefix: Initial diagnosis   Post-radiation dysphagia and taste changes Difficulty swallowing and altered taste perception. Maintaining weight with oral intake. Refusal to attend swallowing therapy. -Reschedule swallowing [facility] appointment at [facility]. -Encourage swallowing exercises to prevent future complications. -Trial of Boost Breeze nutritional shakes. -Given that he is drinking plenty of clear fluid at home we will stop his  IV fluids -He saw our nutritionist today  Post-radiation skin changes Healed skin over neck with good care. -Continue current skin care regimen.  Follow-up plans -Return visit with PA Joyice Faster in two weeks for BMP labs and weight check. -Follow-up with me in 2.5 months to review PET scan results.   Thyroid function: Could be compromised by radiation, recommend annual labs for monitoring Lab Results  Component Value Date   TSH 1.413 08/10/2023     On date of service, in total, I spent 20 minutes on this encounter. Patient was seen in person. _____________________________________   Lonie Peak, MD

## 2023-11-16 ENCOUNTER — Telehealth: Payer: Self-pay | Admitting: *Deleted

## 2023-11-16 ENCOUNTER — Ambulatory Visit: Payer: Self-pay | Admitting: Physical Therapy

## 2023-11-16 NOTE — Telephone Encounter (Signed)
 CALLED PATIENT TO REMIND OF LAB,. FU AND VISIT WITH NUTRITION FOR 11-17-23, SPOKE WITH PATIENT'S DAUGHTER- HEATHER AND SHE IS AWARE OF THESE APPTS.

## 2023-11-17 ENCOUNTER — Encounter: Payer: Self-pay | Admitting: Radiology

## 2023-11-17 ENCOUNTER — Inpatient Hospital Stay: Payer: Medicare PPO | Attending: Oncology | Admitting: Dietician

## 2023-11-17 ENCOUNTER — Ambulatory Visit
Admission: RE | Admit: 2023-11-17 | Discharge: 2023-11-17 | Disposition: A | Discharge status: Home / Self Care | Payer: Medicare PPO | Source: Ambulatory Visit | Attending: Radiation Oncology | Admitting: Radiation Oncology

## 2023-11-17 ENCOUNTER — Ambulatory Visit
Admission: RE | Admit: 2023-11-17 | Discharge: 2023-11-17 | Disposition: A | Discharge status: Home / Self Care | Payer: Medicare PPO | Source: Ambulatory Visit | Attending: Radiology | Admitting: Radiology

## 2023-11-17 VITALS — BP 132/83 | HR 82 | Temp 98.3°F | Resp 18 | Ht 69.0 in | Wt 189.0 lb

## 2023-11-17 DIAGNOSIS — Z7984 Long term (current) use of oral hypoglycemic drugs: Secondary | ICD-10-CM | POA: Insufficient documentation

## 2023-11-17 DIAGNOSIS — E876 Hypokalemia: Secondary | ICD-10-CM | POA: Insufficient documentation

## 2023-11-17 DIAGNOSIS — K1230 Oral mucositis (ulcerative), unspecified: Secondary | ICD-10-CM | POA: Insufficient documentation

## 2023-11-17 DIAGNOSIS — C090 Malignant neoplasm of tonsillar fossa: Secondary | ICD-10-CM | POA: Insufficient documentation

## 2023-11-17 DIAGNOSIS — Z79899 Other long term (current) drug therapy: Secondary | ICD-10-CM | POA: Insufficient documentation

## 2023-11-17 DIAGNOSIS — Z7902 Long term (current) use of antithrombotics/antiplatelets: Secondary | ICD-10-CM | POA: Insufficient documentation

## 2023-11-17 DIAGNOSIS — Z923 Personal history of irradiation: Secondary | ICD-10-CM | POA: Insufficient documentation

## 2023-11-17 LAB — BASIC METABOLIC PANEL - CANCER CENTER ONLY
Anion gap: 8 (ref 5–15)
BUN: 14 mg/dL (ref 8–23)
CO2: 26 mmol/L (ref 22–32)
Calcium: 8.7 mg/dL — ABNORMAL LOW (ref 8.9–10.3)
Chloride: 104 mmol/L (ref 98–111)
Creatinine: 1.09 mg/dL (ref 0.61–1.24)
GFR, Estimated: >60 mL/min (ref >60)
Glucose, Bld: 131 mg/dL — ABNORMAL HIGH (ref 70–99)
Potassium: 3.3 mmol/L — ABNORMAL LOW (ref 3.5–5.1)
Sodium: 138 mmol/L (ref 135–145)

## 2023-11-17 NOTE — Progress Notes (Signed)
 Radiation Oncology         [phone]) [phone] ________________________________  Name: Arthur Cisneros. MRN: [id number]  Date: 11/17/2023  DOB: 09-12-1956  Follow-Up Visit Note  CC: Inc, [facility]  [place], Vaughan, MD  Diagnosis and Prior Radiotherapy:       ICD-10-CM   1. Malignant neoplasm of tonsillar fossa (HCC)  C09.0       Plan Name: HN_R_tonsil Site: Tonsil, Right Technique: IMRT Mode: Photon Dose Per Fraction: 2 Gy Prescribed Dose (Delivered / Prescribed): 70 Gy / 70 Gy Prescribed Fxs (Delivered / Prescribed): 35 / 35   CHIEF COMPLAINT:  Here for follow-up and surveillance of throat cancer  Narrative:   Arthur Cisneros presents today for follow-up after completing radiation to his right tonsil on 10/19/2023. The patient returns today to review labs and for a weight check.   Pain issues, if any: He reports soreness all over from a recent viral infections.  Using a feeding tube?: He does not have a feeding tube. Weight changes, if any:  Patient has lost 11 lbs in the last 2 weeks. Patient states he does not have an appetite due to altered taste and thick saliva.     Wt Readings from Last 3 Encounters: 11/17/2023        189.0 lb  11/03/23 200 lb (90.7 kg)  10/19/23 199 lb (90.3 kg)  Swallowing issues, if any: Yes, he reports having some issues with swallowing due to thick salivia Smoking or chewing tobacco? None Using fluoride toothpaste daily? N/A -- full set of dentures Last ENT visit was on: Not since diagnosis. Other notable issues, if any: Skin appears intact and well healed in treatment field.                     ALLERGIES:  is allergic to 5-alpha reductase inhibitors, lyrica [pregabalin], motrin [ibuprofen], and tramadol .  Meds: Current Outpatient Medications  Medication Sig Dispense Refill   acetaminophen  (TYLENOL ) 500 MG tablet Take 1,000 mg by mouth every 6 (six) hours as needed (pain).      amLODipine (NORVASC) 5 MG tablet Take 5 mg by mouth daily.      atorvastatin  (LIPITOR) 40 MG tablet Take 40 mg by mouth at bedtime.      cholecalciferol (VITAMIN D) 25 MCG (1000 UNIT) tablet Take 1,000 Units by mouth daily.     clopidogrel  (PLAVIX ) 75 MG tablet Take 75 mg by mouth daily.     fluticasone (FLONASE) 50 MCG/ACT nasal spray Place 1 spray into both nostrils daily.     HYDROcodone -acetaminophen  (NORCO/VICODIN) 5-325 MG tablet Take 1 tablet by mouth 4 (four) times daily as needed for moderate pain (pain score 4-6). 20 tablet 0   levETIRAcetam  (KEPPRA ) 250 MG tablet Take 1 tablet (250 mg total) by mouth 2 (two) times daily. 180 tablet 3   lidocaine  (XYLOCAINE ) 2 % solution Patient: Mix 1part 2% viscous lidocaine , 1part H20. Swish & swallow 10mL of diluted mixture, 30min before meals and at bedtime, up to QID 200 mL 3   losartan (COZAAR) 25 MG tablet Take 25 mg by mouth daily.     metFORMIN (GLUCOPHAGE-XR) 500 MG 24 hr tablet Take 1,000 mg by mouth 2 (two) times a day.      nitroGLYCERIN  (NITROSTAT ) 0.4 MG SL tablet DISSOLVE (1) TABLET UNDER TONGUE AS NEEDED TO RELIEVE CHEST PAIN. MAY REPEAT EVERY 5 MINUTES. (if no relief after 2nd dose, proceed to the ED for an evaluation or call 911).  25 tablet 3   tiZANidine  (ZANAFLEX ) 2 MG tablet TAKE 1 OR 2 TABLETS BY MOUTH AT BEDTIME AS NEEDED 90 tablet 3   vitamin B-12 (CYANOCOBALAMIN) 1000 MCG tablet Take 1,000 mcg by mouth daily.     ZETIA 10 MG tablet Take 10 mg by mouth daily.     No current facility-administered medications for this encounter.    Physical Findings: Gen: The patient is in no acute distress. Patient is alert and oriented. Wt Readings from Last 3 Encounters:  11/17/23 189 lb (85.7 kg)  11/03/23 200 lb (90.7 kg)  10/19/23 199 lb (90.3 kg)    height is 5' 9 (1.753 m) and weight is 189 lb (85.7 kg). His oral temperature is 98.3 F (36.8 C). His blood pressure is 132/83 and his pulse is 82. His respiration is 18 and oxygen saturation is 98%. SABRA    HEENT: Full dentures present. Healing  mucosa with mucositis visible at the roof of tongue.mucosa. No thrush or signs of tumor. NECK:moderate fullness in level 2 area, possibly scar tissue from previous lymph node treatment. SKIN: Skin over neck healed, it is smooth with some residual erythema. No open wounds present MSK: in a wheelchair  Lab Findings: Lab Results  Component Value Date   WBC 8.5 08/10/2023   HGB 15.1 08/10/2023   HCT 44.6 08/10/2023   MCV 90.8 08/10/2023   PLT 240 08/10/2023    Lab Results  Component Value Date   TSH 1.413 08/10/2023    Radiographic Findings: No results found.  Impression/Plan:   Cancer Staging  Primary cancer of tonsillar fossa (HCC) Staging form: Pharynx - HPV-Mediated Oropharynx, AJCC 8th Edition - Clinical stage from 08/08/2023: Stage I (cT2, cN1, cM0, p16+) - Signed by Izell Domino, MD on 08/09/2023 Stage prefix: Initial diagnosis   # Post-radiation dysphagia and taste changes Difficulty swallowing due to altered taste perception and mucositis. He has lost 11 lbs in the past 2 weeks. His labs show slightly low potassium (3.3) Patient refused additional IV fluids at this time.  -He has refused swallowing therapy in [place]. We are trying to reschedule swallowing therapy appointment at [facility]. We again encouraged swallowing exercises to prevent future complications.  -Encouraged oral intake despite little appetite to help with healing and to avoid IV fluids.  -He saw our nutritionist today and was educated on ways to increase oral intake. He is going to check his weight daily.   # Post-radiation skin changes Healed skin over neck with good care. -Continue with sonafine cream.   Follow-up plans -I will call the patient on Tuesday, 11/20/2022, for an update on his weight. If he continues to lose weight we may need to discuss the addition of IV fluids. Patient understands and is in agreement with this plan.  -Follow-up with Dr. Izell on 01/19/2024 to review PET scan  results.   Thyroid  function: Could be compromised by radiation, recommend annual labs for monitoring Lab Results  Component Value Date   TSH 1.413 08/10/2023     On date of service, in total, I spent 20 minutes on this encounter. Patient was seen in person. _____________________________________    Ronita Due, PA-C

## 2023-11-17 NOTE — Progress Notes (Deleted)
 Arthur Cisneros

## 2023-11-17 NOTE — Progress Notes (Signed)
 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 Final RT 12/5   Met with patient following radiation appointment. Wife and daughter are present for visit. Wife reports patient was eating much better until the house came down with the flu over the Christmas holiday. Wife says he is barely eating now. Yesterday he had half of a hotdog and a few fries. Patient reports he is afraid of getting strangled. He did like the Boost Breeze samples.    Medications: reviewed   Labs: no new labs  Anthropometrics: Pt 189 lb today decreased from 200 lb on 12/20 (suspect some loss r/t fluid)   NUTRITION DIAGNOSIS: Unintentional wt loss continues     INTERVENTION:  Reinforced importance of small frequent meals/snacks to promote healing/minimize further wt loss Provided samples of Boost Breeze    MONITORING, EVALUATION, GOAL: wt trends, intake   NEXT VISIT: To be scheduled

## 2023-11-17 NOTE — Progress Notes (Signed)
 Patient identity verified x2  [name] presents today for a follow-up appointment today for BMP labs and weight check.  Treatment Completion Date: 10/19/2023 Pain issues, if any: He reports soreness all over. Using a feeding tube?: He does not have a feeding tube. Weight changes, if any:  Wt Readings from Last 3 Encounters: 11/17/2023        189.0 lb  11/03/23 200 lb (90.7 kg)  10/19/23 199 lb (90.3 kg)   Swallowing issues, if any: Yes, he reports having some swallowing issues and  reports not having  an appetite. Smoking or chewing tobacco? None Using fluoride toothpaste daily? N/A -- full set of dentures Last ENT visit was on: Not since diagnosis. Other notable issues, if any: Continues to deal with dry mouth and thick saliva. Skin appears intact and well healed in treatment field.  BP 132/83 (BP Location: Right Arm, Patient Position: Sitting, Cuff Size: Normal)   Pulse 82   Temp 98.3 F (36.8 C) (Oral)   Resp 18   Ht 5' 9 (1.753 m)   Wt 189 lb (85.7 kg)   SpO2 98%   BMI 27.91 kg/m

## 2023-11-17 NOTE — Progress Notes (Deleted)
 Marland Kitchen

## 2023-11-21 ENCOUNTER — Telehealth: Payer: Self-pay | Admitting: Dietician

## 2023-11-21 NOTE — Telephone Encounter (Signed)
 Brief Nutrition Note  Spoke with wife of patient via telephone to assess oral intake.  Wife reports pt has been eating more since 1/3 nutrition follow-up. He is doing lidocaine  rinse ~30 minutes before eating which has been helping. After appointments on Friday, pt had a few shrimp, half of sweet potato, and slaw.   On Saturday, pt had ham, grits, half egg, half piece of toast. He ate the other half of sweet potato for dinner.   Sunday, pt had one pancake 2-3 pieces of bacon, and egg. Recalls the eating the remainder of his leftovers for dinner (slaw, shrimp, fish, and hush puppies).  Yesterday, pt had potato salad, green peas, and a few bites of BBQ chicken. Patient had bowl of ice cream last night, however this gave him diarrhea.   He is having broccoli and cheese topped with bacon for lunch today.   Wife reports he is drinking juices, Mt. Dew and Pepsi.   Intervention: Encouraged small frequent meals high in calories and protein Recommend increasing intake of water , suggested milk vs pepsi as tolerated for calories and protein Continue lidocaine  rinse 30 minutes prior to eating Recommend baking soda salt water  rinses   Next Visit: Will plan to call pt Friday 1/10

## 2023-11-24 ENCOUNTER — Telehealth: Payer: Self-pay | Admitting: Dietician

## 2023-11-24 NOTE — Telephone Encounter (Signed)
 Brief Nutrition Note:  Spoke with wife of patient via telephone. Wife reports pt was not feeling well yesterday and did not eat much. He is feeling some better today. Patient ate 2 pieces of bacon, one egg, toast with butter and jelly for breakfast. He as not eaten lunch. Wife reports he is drinking plenty of fluids.   Will follow-up via telephone in 2 weeks. Wife has contact information and encouraged to call with nutrition concerns.

## 2023-11-30 ENCOUNTER — Ambulatory Visit (HOSPITAL_COMMUNITY): Payer: Medicare PPO | Attending: Radiation Oncology | Admitting: Speech Pathology

## 2023-11-30 ENCOUNTER — Encounter (HOSPITAL_COMMUNITY): Payer: Self-pay | Admitting: Speech Pathology

## 2023-11-30 DIAGNOSIS — R1310 Dysphagia, unspecified: Secondary | ICD-10-CM | POA: Insufficient documentation

## 2023-11-30 NOTE — Therapy (Signed)
OUTPATIENT SPEECH LANGUAGE PATHOLOGY ONCOLOGY TREATMENT   Patient Name: Arthur Cisneros. MRN: [id number] DOB:1956-02-11, 68 y.o., male Today's Date: 11/30/2023  PCP: [facility] REFERRING PROVIDER: Lonie Peak, MD  END OF SESSION:  End of Session - 11/30/23 1002     Visit Number 2    Number of Visits 7    Date for SLP Re-Evaluation 12/20/23    Authorization Type [employer]   eff 11/15/23 ded-0 oop-4000 met 0 auth-yes copay-20$ [employer] medicare   SLP Start Time [phone]    SLP Stop Time  0935    SLP Time Calculation (min) 45 min    Activity Tolerance Patient tolerated treatment well             Past Medical History:  Diagnosis Date   Carotid artery disease (HCC)    Coronary artery disease    Multivessel disease status post CABG in 2000 at [facility]   Depression    Essential hypertension    Hyperlipidemia    Nephrolithiasis    Prostate cancer (HCC) 2016   Status post XRT   Sepsis (HCC) 06/11/2019   Stroke [employer]) 2011   Right MCA infarct   Type 2 diabetes mellitus (HCC)    Past Surgical History:  Procedure Laterality Date   CORONARY ARTERY BYPASS GRAFT     4 graft   CYSTOSCOPY WITH STENT PLACEMENT Right 06/11/2019   Procedure: CYSTOSCOPY WITH STENT PLACEMENT;  Surgeon: Crista Elliot, MD;  Location: [facility];  Service: Urology;  Laterality: Right;   CYSTOSCOPY/URETEROSCOPY/HOLMIUM LASER/STENT PLACEMENT Right 06/06/2019   Procedure: CYSTOSCOPY/URETEROSCOPY/HOLMIUM LASER/STENT PLACEMENT;  Surgeon: Heloise Purpura, MD;  Location: [facility];  Service: Urology;  Laterality: Right;   CYSTOSCOPY/URETEROSCOPY/HOLMIUM LASER/STENT PLACEMENT Right 07/15/2019   Procedure: CYSTOSCOPY/URETEROSCOPY/STENT PLACEMENT;  Surgeon: Heloise Purpura, MD;  Location: [facility];  Service: Urology;  Laterality: Right;   CYSTOSCOPY/URETEROSCOPY/HOLMIUM LASER/STENT PLACEMENT Bilateral 01/30/2020   Procedure: CYSTOSCOPY/RETROGRADE/URETEROSCOPY /RIGHT STENT PLACEMENT;  Surgeon: Heloise Purpura,  MD;  Location: [facility];  Service: Urology;  Laterality: Bilateral;   PROSTATE BIOPSY     right hand surgery     Patient Active Problem List   Diagnosis Date Noted   Bilateral renal stones 08/18/2023   Hydronephrosis of right kidney 08/10/2023   Hydroureter on right 08/10/2023   Primary cancer of tonsillar fossa (HCC) 08/08/2023   History of stroke 12/18/2017   Malignant neoplasm of prostate (HCC) 08/17/2015   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/11/2015   Spastic hemiparesis affecting nondominant side (HCC) 06/11/2015   Seizure disorder, 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HCC) 06/11/2015   Arteriosclerosis of bypass graft of coronary artery 05/19/2015   Abnormal prostate specific antigen 05/19/2015   Essential (primary) hypertension 05/19/2015   Acid reflux 05/19/2015   Borderline diabetes 05/19/2015   HLD (hyperlipidemia) 05/19/2015   Acute ischemic vertebrobasilar artery brainstem stroke involving right-sided vessel (HCC) 12/02/2014   Headache 09/09/2014   Spondylosis of lumbar region without myelopathy or radiculopathy 08/29/2014   Arthropathy of sacroiliac joint 08/29/2014   Spastic hemiplegia affecting nondominant side (HCC) 05/09/2014   Nontraumatic shoulder pain 05/09/2014   Occlusion and stenosis of carotid artery with cerebral infarction 08/05/2013   Convulsions (HCC) 08/05/2013   Bicipital tenosynovitis 03/23/2012   Left spastic hemiparesis (HCC) 03/23/2012    ONSET DATE: See "pertinent history" below   REFERRING DIAG:  C09.0 (ICD-10-CM) - Primary cancer of tonsillar fossa (HCC)     THERAPY DIAG:  Dysphagia, unspecified type  Rationale for Evaluation and Treatment: Rehabilitation  SUBJECTIVE:  SUBJECTIVE STATEMENT: "I have been eating a lot of things." Denies dysphagia s/sx, currently. Pt accompanied by: significant other and family member  PERTINENT HISTORY:  CVA with resulting L sided weakness in 2011.  SCC of the right tonsil, stage I (T2N1M0, p  16 +). He is followed by [facility] specialist for surveillance of carotid artery stenosis. In that setting he had CTA of the head and neck on 07/06/23 which incidentally revealed an asymmetric 2.9 cm soft tissue mass at the right tongue base/glossotonsillar sulcus concerning for malignancy, as well as multiple enlarged and necrotic appearing right sided cervical nodes metastatic lymphadenopathy. 07/20/23 He saw Dr. Jenne Pane for further evaluation. Physical exam performed noted a firm, round mass that was palpable in the right upper neck measuring approx. 2.5 cm and a smaller mass just posterior and an isolated large irregular appearing right tonsillar mass. He completed a biopsy in office that day of the right tonsil mass which identified non- keratinizing SCC, p 16 +. 08/04/23 PET demonstrated: a hypermetabolic right tonsillar mass with adjacent centrally necrotic level IIA lymph nodes with no distant metastatic disease identified. Progressive chronic severe right hydronephrosis and hydroureter with chronic obstructing 3 mm calculus in the distal right ureter was also seen. (Patient follows with Dr. Laverle Patter for these issues). 08/08/23 Consult with Dr. Basilio Cairo and 08/10/23 Consult with Dr. Arlana Pouch. Chemo/radiation was recommended but patient has declined chemotherapy and will receive radiation alone. Treatment plan:  He will receive 35 fractions of radiation to his tonsil and right neck. He started on 10/17 and he will complete 12/6.   PAIN:  Are you having pain? No  FALLS: Has patient fallen in last 6 months?  See PT evaluation for details  LIVING ENVIRONMENT: Lives with: lives with their family Lives in: House/apartment  PLOF:  Level of assistance: Comment: See PT Eval Employment: Retired  PATIENT GOALS: Pt indicated he wanted to continue WNL swallowing  OBJECTIVE:  Note: Objective measures were completed at Evaluation unless otherwise noted. DIAGNOSTIC FINDINGS: See "Pertinent history"  avove  INSTRUMENTAL SWALLOW STUDY FINDINGS  N/A  COGNITION: Overall cognitive status: Within functional limits for tasks assessed; ? memory as pt daughter answered some questions for pt  LANGUAGE: Receptive and Expressive language appeared WNL.  ORAL MOTOR EXAMINATION: Overall status: Impaired: Labial: Left (Symmetry, Strength, and Coordination) Lingual: Left (Symmetry, Strength, and Coordination) Facial: Left (Symmetry) Comments: Residual lt side weakness from CVA   MOTOR SPEECH: Overall motor speech: impaired Level of impairment: Sentence Respiration: thoracic breathing Phonation: normal Resonance: WFL Articulation: Impaired: sentence Intelligibility: Intelligible Motor planning: Appears intact Effective technique: over articulate  SUBJECTIVE DYSPHAGIA REPORTS:  Date of onset: "I always held water in my mouth - even when I was a kid" Reported symptoms:  oral holding  Current diet: regular and thin liquids  Co-morbid voice changes: No  FACTORS WHICH MAY INCREASE RISK OF ADVERSE EVENT IN PRESENCE OF ASPIRATION:  General health: well appearing  Risk factors: reduced cognitive function (? Memory)  CLINICAL SWALLOW ASSESSMENT:   Dentition: dentures (upper) and dentures (lower)  Vocal quality at baseline: normal Patient directly observed with POs: Yes: dysphagia 3 (soft) and thin liquids  Feeding: able to feed self Liquids provided by: cup Oral phase signs and symptoms:  none; SLP did not note oral holding Pharyngeal phase signs and symptoms:  none   09/21/23 (eval): Research states the risk for dysphagia increases due to radiation and/or chemotherapy treatment due to a variety of factors, so SLP educated the  pt about the possibility of reduced/limited ability for PO intake during rad tx. SLP also educated pt regarding possible changes to swallowing musculature after rad tx, and why adherence to dysphagia HEP provided today and PO consumption was necessary to inhibit muscle  fibrosis following rad tx and to mitigate muscle disuse atrophy. SLP informed pt why this would be detrimental to their swallowing status and to their pulmonary health. Pt demonstrated understanding of these things to SLP. SLP encouraged pt to safely eat and drink as deep into their radiation/chemotherapy as possible to provide the best possible long-term swallowing outcome for pt.  SLP then developed an individualized HEP for pt involving oral and pharyngeal strengthening and ROM and pt was instructed how to perform these exercises, including SLP demonstration. After SLP demonstration, pt return demonstrated each exercise. SLP ensured pt performance was correct prior to educating pt on next exercise. Pt required occasional mod cues faded to modified independent to perform HEP. Pt was instructed to complete this program 6-7 days/week, at least 20 reps a day (except [place] - 6 reps/day no more than 3 at one time) until 6 months after his or her last day of rad tx, and then x2 a week after that, indefinitely. Among other modifications for days when pt cannot functionally swallow, SLP also suggested pt to perform only non-swallowing tasks on the handout/HEP, and if necessary to cycle through the swallowing portion so the full program of exercises can be completed instead of fatiguing on one of the swallowing exercises and being unable to perform the other swallowing exercises. SLP instructed that swallowing exercises should then be added back into the regimen as pt is able to do so.   PATIENT EDUCATION: Education details: late effects head/neck radiation on swallow function, HEP procedure, and modification to HEP when difficulty experienced with swallowing during and after radiation course Person educated: Patient Education method: Explanation, Demonstration, Verbal cues, and Handouts Education comprehension: verbalized understanding, returned demonstration, verbal cues required, and needs further  education   ASSESSMENT:  Current Treatment: Pt accompanied to therapy by his wife. SLP introduced self to Pt and spouse as he was previously seen in [place] in November. He completed radiation 10/19/2023 and is eating a variety of foods (sesame chicken, crab rangoon, corn dog, beets, cucumbers, apple slices, slaw, sweet potato, etc). His weight was reported to be ~200 pounds before he became ill over Christmas with the rest of his family (flu suspected) and he is now reportedly 181 pounds. His appetite is now improving. He reports that he occasionally still uses lidocaine wash, however unsure as to why. He was encouraged to ask his doctor whether necessary, but to continue with baking soda rinse. Pt with some mucous/secretions noted along tongue, however clears with PO intake. Pt wears U/L dentures and they do not fit as well (has worn for over 30 years) likely due to weight loss. He reports some dysgeusia, but still enjoys eating a variety of foods. His water intake is minimal (~2 glasses a day) and he primarily drinks regular sodas (4-5 glasses a day). He was encouraged to increase water intake and consider adding flavorings if that is more appealing. Pt consumed regular textures and thin liquids in our session without signs of aspiration and no reports of globus. Pt did report esophageal globus sensation when eating a corn dog the other day. He reports limited completing of pharyngeal swallowing exercises given to him in November. SLP provided ongoing eduction regarding long term effects of radiation and encouraged  Pt to complete exercises as assigned (he will bring with him next session). SLP provided food journal, soft food ideas, and information regarding signs of aspiration. Pt appears to be doing well with swallowing as stated above, however he needs to continue with exercises going forward and meet hydration needs. Will plan to see Pt for 2-3 sessions before 12/20/23.   CLINICAL IMPRESSION: (from  initial evaluation 09/21/2023 with Baldo Ash) Patient is a 68 y.o. M who was seen today for assessment of swallowing as they undergo radiation/chemoradiation therapy. Today pt ate [place] sandwich and drank thin liquids without overt s/s oral or pharyngeal difficulty. Pt suffered CVA 13 years ago which has affected his speech intelligibility although he reports people understand his speech. Pt was 95% intelligible to this listener today. At this time pt swallowing is deemed WNL/WFL with these POs. No oral or overt s/sx pharyngeal deficits, including aspiration were observed. There are no overt s/s aspiration PNA observed by SLP nor any reported by pt at this time. Data indicate that pt's swallow ability will likely decrease over the course of radiation/chemoradiation therapy and could very well decline over time following the conclusion of that therapy due to muscle disuse atrophy and/or muscle fibrosis. Pt will cont to need to be seen by SLP in order to assess safety of PO intake, assess the need for recommending any objective swallow assessment, and ensuring pt is correctly completing the individualized HEP.  OBJECTIVE IMPAIRMENTS: include dysphagia. These impairments are limiting patient from safety when swallowing. Factors affecting potential to achieve goals and functional outcome are none noted today. Patient will benefit from skilled SLP services to address above impairments and improve overall function.  REHAB POTENTIAL: Excellent   GOALS: Goals reviewed with patient? No  SHORT TERM GOALS: Target: 3rd total session   1. Pt will compelte HEP with rare min A in 2 sessions Baseline: Goal status: ONGOING   2.  pt will tell SLP why pt is completing HEP with min ?ing cues Baseline:  Goal status: ONGOING   3.  pt and/or family will describe 3 overt s/s aspiration PNA with modified independence Baseline:  Goal status: ONGOING   4.  pt and/or family will tell SLP how a food journal could hasten  return to a more normalized diet Baseline:  Goal status: ONGOING     LONG TERM GOALS: Target: 7th total session   1.  pt will complete HEP with modified independence over two visits Baseline:  Goal status: ONGOING   2.  Pt and/or family will describe how to modify HEP over time, and the timeline associated with reduction in HEP frequency with modified independence over two sessions Baseline:  Goal status: ONGOING     PLAN:   SLP FREQUENCY:  once a week for 3 weeks   SLP DURATION:  3 additional sessions   PLANNED INTERVENTIONS: Aspiration precaution training, Pharyngeal strengthening exercises, Diet toleration management , Trials of upgraded texture/liquids, SLP instruction and feedback, Compensatory strategies, and Patient/family education  Thank you,  Havery Moros, CCC-SLP [phone]  Aubrynn Katona, CCC-SLP 11/30/2023, 12:52 PM

## 2023-12-11 ENCOUNTER — Ambulatory Visit (HOSPITAL_COMMUNITY): Payer: Medicare PPO | Admitting: Speech Pathology

## 2023-12-11 ENCOUNTER — Encounter (HOSPITAL_COMMUNITY): Payer: Self-pay | Admitting: Speech Pathology

## 2023-12-11 DIAGNOSIS — R1310 Dysphagia, unspecified: Secondary | ICD-10-CM | POA: Diagnosis not present

## 2023-12-11 NOTE — Therapy (Signed)
OUTPATIENT SPEECH LANGUAGE PATHOLOGY ONCOLOGY TREATMENT   Patient Name: Arthur Cisneros. MRN: [id number] DOB:1956/08/19, 68 y.o., male Today's Date: 12/11/2023  PCP: [facility] REFERRING PROVIDER: Lonie Peak, MD  END OF SESSION:  End of Session - 12/11/23 0942     Visit Number 3    Number of Visits 7    Date for SLP Re-Evaluation 12/20/23    Authorization Type [employer]   eff 11/15/23 ded-0 oop-4000 met 0 auth-yes copay-20$ [employer] medicare   SLP Start Time [phone]    SLP Stop Time  0935    SLP Time Calculation (min) 46 min    Activity Tolerance Patient tolerated treatment well             Past Medical History:  Diagnosis Date   Carotid artery disease (HCC)    Coronary artery disease    Multivessel disease status post CABG in 2000 at [facility]   Depression    Essential hypertension    Hyperlipidemia    Nephrolithiasis    Prostate cancer (HCC) 2016   Status post XRT   Sepsis (HCC) 06/11/2019   Stroke [facility]) 2011   Right MCA infarct   Type 2 diabetes mellitus (HCC)    Past Surgical History:  Procedure Laterality Date   CORONARY ARTERY BYPASS GRAFT     4 graft   CYSTOSCOPY WITH STENT PLACEMENT Right 06/11/2019   Procedure: CYSTOSCOPY WITH STENT PLACEMENT;  Surgeon: Crista Elliot, MD;  Location: [facility];  Service: Urology;  Laterality: Right;   CYSTOSCOPY/URETEROSCOPY/HOLMIUM LASER/STENT PLACEMENT Right 06/06/2019   Procedure: CYSTOSCOPY/URETEROSCOPY/HOLMIUM LASER/STENT PLACEMENT;  Surgeon: Heloise Purpura, MD;  Location: [facility];  Service: Urology;  Laterality: Right;   CYSTOSCOPY/URETEROSCOPY/HOLMIUM LASER/STENT PLACEMENT Right 07/15/2019   Procedure: CYSTOSCOPY/URETEROSCOPY/STENT PLACEMENT;  Surgeon: Heloise Purpura, MD;  Location: [facility];  Service: Urology;  Laterality: Right;   CYSTOSCOPY/URETEROSCOPY/HOLMIUM LASER/STENT PLACEMENT Bilateral 01/30/2020   Procedure: CYSTOSCOPY/RETROGRADE/URETEROSCOPY /RIGHT STENT PLACEMENT;  Surgeon: Heloise Purpura,  MD;  Location: [facility];  Service: Urology;  Laterality: Bilateral;   PROSTATE BIOPSY     right hand surgery     Patient Active Problem List   Diagnosis Date Noted   Bilateral renal stones 08/18/2023   Hydronephrosis of right kidney 08/10/2023   Hydroureter on right 08/10/2023   Primary cancer of tonsillar fossa (HCC) 08/08/2023   History of stroke 12/18/2017   Malignant neoplasm of prostate (HCC) 08/17/2015   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/11/2015   Spastic hemiparesis affecting nondominant side (HCC) 06/11/2015   Seizure disorder, 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HCC) 06/11/2015   Arteriosclerosis of bypass graft of coronary artery 05/19/2015   Abnormal prostate specific antigen 05/19/2015   Essential (primary) hypertension 05/19/2015   Acid reflux 05/19/2015   Borderline diabetes 05/19/2015   HLD (hyperlipidemia) 05/19/2015   Acute ischemic vertebrobasilar artery brainstem stroke involving right-sided vessel (HCC) 12/02/2014   Headache 09/09/2014   Spondylosis of lumbar region without myelopathy or radiculopathy 08/29/2014   Arthropathy of sacroiliac joint 08/29/2014   Spastic hemiplegia affecting nondominant side (HCC) 05/09/2014   Nontraumatic shoulder pain 05/09/2014   Occlusion and stenosis of carotid artery with cerebral infarction 08/05/2013   Convulsions (HCC) 08/05/2013   Bicipital tenosynovitis 03/23/2012   Left spastic hemiparesis (HCC) 03/23/2012    ONSET DATE: See "pertinent history" below   REFERRING DIAG:  C09.0 (ICD-10-CM) - Primary cancer of tonsillar fossa (HCC)     THERAPY DIAG:  Dysphagia, unspecified type  Rationale for Evaluation and Treatment: Rehabilitation  SUBJECTIVE:  SUBJECTIVE STATEMENT: "I think I can eat most things." Denies dysphagia s/sx, currently. Pt accompanied by: significant other and family member  PERTINENT HISTORY:  CVA with resulting L sided weakness in 2011.  SCC of the right tonsil, stage I (T2N1M0, p 16 +).  He is followed by [facility] and vein specialist for surveillance of carotid artery stenosis. In that setting he had CTA of the head and neck on 07/06/23 which incidentally revealed an asymmetric 2.9 cm soft tissue mass at the right tongue base/glossotonsillar sulcus concerning for malignancy, as well as multiple enlarged and necrotic appearing right sided cervical nodes metastatic lymphadenopathy. 07/20/23 He saw Dr. Jenne Pane for further evaluation. Physical exam performed noted a firm, round mass that was palpable in the right upper neck measuring approx. 2.5 cm and a smaller mass just posterior and an isolated large irregular appearing right tonsillar mass. He completed a biopsy in office that day of the right tonsil mass which identified non- keratinizing SCC, p 16 +. 08/04/23 PET demonstrated: a hypermetabolic right tonsillar mass with adjacent centrally necrotic level IIA lymph nodes with no distant metastatic disease identified. Progressive chronic severe right hydronephrosis and hydroureter with chronic obstructing 3 mm calculus in the distal right ureter was also seen. (Patient follows with Dr. Laverle Patter for these issues). 08/08/23 Consult with Dr. Basilio Cairo and 08/10/23 Consult with Dr. Arlana Pouch. Chemo/radiation was recommended but patient has declined chemotherapy and will receive radiation alone. Treatment plan:  He will receive 35 fractions of radiation to his tonsil and right neck. He started on 10/17 and he will complete 12/6.   PAIN:  Are you having pain? No  FALLS: Has patient fallen in last 6 months?  See PT evaluation for details  LIVING ENVIRONMENT: Lives with: lives with their family Lives in: House/apartment  PLOF:  Level of assistance: Comment: See PT Eval Employment: Retired  PATIENT GOALS: Pt indicated he wanted to continue WNL swallowing  OBJECTIVE:  Note: Objective measures were completed at Evaluation unless otherwise noted. DIAGNOSTIC FINDINGS: See "Pertinent history"  avove  INSTRUMENTAL SWALLOW STUDY FINDINGS  N/A  COGNITION: Overall cognitive status: Within functional limits for tasks assessed; ? memory as pt daughter answered some questions for pt  LANGUAGE: Receptive and Expressive language appeared WNL.  ORAL MOTOR EXAMINATION: Overall status: Impaired: Labial: Left (Symmetry, Strength, and Coordination) Lingual: Left (Symmetry, Strength, and Coordination) Facial: Left (Symmetry) Comments: Residual lt side weakness from CVA   MOTOR SPEECH: Overall motor speech: impaired Level of impairment: Sentence Respiration: thoracic breathing Phonation: normal Resonance: WFL Articulation: Impaired: sentence Intelligibility: Intelligible Motor planning: Appears intact Effective technique: over articulate  SUBJECTIVE DYSPHAGIA REPORTS:  Date of onset: "I always held water in my mouth - even when I was a kid" Reported symptoms:  oral holding  Current diet: regular and thin liquids  Co-morbid voice changes: No  FACTORS WHICH MAY INCREASE RISK OF ADVERSE EVENT IN PRESENCE OF ASPIRATION:  General health: well appearing  Risk factors: reduced cognitive function (? Memory)  CLINICAL SWALLOW ASSESSMENT:   Dentition: dentures (upper) and dentures (lower)  Vocal quality at baseline: normal Patient directly observed with POs: Yes: dysphagia 3 (soft) and thin liquids  Feeding: able to feed self Liquids provided by: cup Oral phase signs and symptoms:  none; SLP did not note oral holding Pharyngeal phase signs and symptoms:  none   09/21/23 (eval): Research states the risk for dysphagia increases due to radiation and/or chemotherapy treatment due to a variety of factors, so SLP educated the pt  about the possibility of reduced/limited ability for PO intake during rad tx. SLP also educated pt regarding possible changes to swallowing musculature after rad tx, and why adherence to dysphagia HEP provided today and PO consumption was necessary to inhibit muscle  fibrosis following rad tx and to mitigate muscle disuse atrophy. SLP informed pt why this would be detrimental to their swallowing status and to their pulmonary health. Pt demonstrated understanding of these things to SLP. SLP encouraged pt to safely eat and drink as deep into their radiation/chemotherapy as possible to provide the best possible long-term swallowing outcome for pt.  SLP then developed an individualized HEP for pt involving oral and pharyngeal strengthening and ROM and pt was instructed how to perform these exercises, including SLP demonstration. After SLP demonstration, pt return demonstrated each exercise. SLP ensured pt performance was correct prior to educating pt on next exercise. Pt required occasional mod cues faded to modified independent to perform HEP. Pt was instructed to complete this program 6-7 days/week, at least 20 reps a day (except [place] - 6 reps/day no more than 3 at one time) until 6 months after his or her last day of rad tx, and then x2 a week after that, indefinitely. Among other modifications for days when pt cannot functionally swallow, SLP also suggested pt to perform only non-swallowing tasks on the handout/HEP, and if necessary to cycle through the swallowing portion so the full program of exercises can be completed instead of fatiguing on one of the swallowing exercises and being unable to perform the other swallowing exercises. SLP instructed that swallowing exercises should then be added back into the regimen as pt is able to do so.   PATIENT EDUCATION: Education details: late effects head/neck radiation on swallow function, HEP procedure, and modification to HEP when difficulty experienced with swallowing during and after radiation course Person educated: Patient Education method: Explanation, Demonstration, Verbal cues, and Handouts Education comprehension: verbalized understanding, returned demonstration, verbal cues required, and needs further  education   ASSESSMENT:  Previous Treatment: Pt accompanied to therapy by his wife. SLP introduced self to Pt and spouse as he was previously seen in [place] in November. He completed radiation 10/19/2023 and is eating a variety of foods (sesame chicken, crab rangoon, corn dog, beets, cucumbers, apple slices, slaw, sweet potato, etc). His weight was reported to be ~200 pounds before he became ill over Christmas with the rest of his family (flu suspected) and he is now reportedly 181 pounds. His appetite is now improving. He reports that he occasionally still uses lidocaine wash, however unsure as to why. He was encouraged to ask his doctor whether necessary, but to continue with baking soda rinse. Pt with some mucous/secretions noted along tongue, however clears with PO intake. Pt wears U/L dentures and they do not fit as well (has worn for over 30 years) likely due to weight loss. He reports some dysgeusia, but still enjoys eating a variety of foods. His water intake is minimal (~2 glasses a day) and he primarily drinks regular sodas (4-5 glasses a day). He was encouraged to increase water intake and consider adding flavorings if that is more appealing. Pt consumed regular textures and thin liquids in our session without signs of aspiration and no reports of globus. Pt did report esophageal globus sensation when eating a corn dog the other day. He reports limited completing of pharyngeal swallowing exercises given to him in November. SLP provided ongoing eduction regarding long term effects of radiation and encouraged Pt  to complete exercises as assigned (he will bring with him next session). SLP provided food journal, soft food ideas, and information regarding signs of aspiration. Pt appears to be doing well with swallowing as stated above, however he needs to continue with exercises going forward and meet hydration needs. Will plan to see Pt for 2-3 sessions before 12/20/23.  Current Treatment: Pt  accompanied to therapy by his wife. Both indicate that Pt has been eating a lot more. He states that he ate a raw red onion and this helped thin out his secretions and mucous and he thinks it also helped sense of taste. He is now trying to eat all textures and consistencies and that most things "taste good". Pt requested to be discharged from SLP services since he feels that he can complete exercises on his own and because he is eating/drinking and gaining weight. SLP reiterated the importance of completing his pharyngeal swallowing exercises going forward to help combat the effects of radiation long term. SLP reviewed all exercises with Pt and had Pt return demonstrate (Effortful, Masako, Mendelsohn, and chin tuck against resistance with swallow). Pt consumed PO in session without incident and wife is supportive of d/c from SLP services at this time. SLP encouraged his wife to remind him to complete his exercises due to some memory deficits. Will d/c from SLP services at this time. Pt and wife were given my contact information should they have further questions or concerns and he can always come back to therapy as changes in swallow may occur over time.   CLINICAL IMPRESSION: (from initial evaluation 09/21/2023 with Baldo Ash) Patient is a 68 y.o. M who was seen today for assessment of swallowing as they undergo radiation/chemoradiation therapy. Today pt ate [place] sandwich and drank thin liquids without overt s/s oral or pharyngeal difficulty. Pt suffered CVA 13 years ago which has affected his speech intelligibility although he reports people understand his speech. Pt was 95% intelligible to this listener today. At this time pt swallowing is deemed WNL/WFL with these POs. No oral or overt s/sx pharyngeal deficits, including aspiration were observed. There are no overt s/s aspiration PNA observed by SLP nor any reported by pt at this time. Data indicate that pt's swallow ability will likely decrease over the course  of radiation/chemoradiation therapy and could very well decline over time following the conclusion of that therapy due to muscle disuse atrophy and/or muscle fibrosis. Pt will cont to need to be seen by SLP in order to assess safety of PO intake, assess the need for recommending any objective swallow assessment, and ensuring pt is correctly completing the individualized HEP.  OBJECTIVE IMPAIRMENTS: include dysphagia. These impairments are limiting patient from safety when swallowing. Factors affecting potential to achieve goals and functional outcome are none noted today. Patient will benefit from skilled SLP services to address above impairments and improve overall function.  REHAB POTENTIAL: Excellent   GOALS: Goals reviewed with patient? yes  SHORT TERM GOALS: Target: 3rd total session   1. Pt will compelte HEP with rare min A in 2 sessions Baseline: Goal status: MET   2.  pt will tell SLP why pt is completing HEP with min ?ing cues Baseline:  Goal status: MET   3.  pt and/or family will describe 3 overt s/s aspiration PNA with modified independence Baseline:  Goal status: MET   4.  pt and/or family will tell SLP how a food journal could hasten return to a more normalized diet Baseline:  Goal  status: MET     LONG TERM GOALS: Target: 7th total session   1.  pt will complete HEP with modified independence over two visits Baseline:  Goal status: MET   2.  Pt and/or family will describe how to modify HEP over time, and the timeline associated with reduction in HEP frequency with modified independence over two sessions Baseline:  Goal status: MET     PLAN: Discharge from SLP services   PLANNED INTERVENTIONS: Aspiration precaution training, Pharyngeal strengthening exercises, Diet toleration management , Trials of upgraded texture/liquids, SLP instruction and feedback, Compensatory strategies, and Patient/family education  SPEECH THERAPY DISCHARGE SUMMARY  Visits from  Start of Care: 3  Current functional level related to goals / functional outcomes: Goals met   Remaining deficits: Pt now consuming a variety of textures and reports that he is able to "taste" them better. He will need to continue with HEP going forward to combat the effects of radiation over time.   Education / Equipment: HEP provided and Pt to continue with swallowing exercises going forward   Patient agrees to discharge. Patient goals were met. Patient is being discharged due to being pleased with the current functional level..     Thank you,  Havery Moros, CCC-SLP [phone]  Jayci Ellefson, CCC-SLP 12/11/2023, 9:46 AM

## 2023-12-18 ENCOUNTER — Ambulatory Visit (HOSPITAL_COMMUNITY): Payer: Medicare PPO | Admitting: Speech Pathology

## 2023-12-25 ENCOUNTER — Ambulatory Visit (HOSPITAL_COMMUNITY): Payer: Medicare PPO | Admitting: Speech Pathology

## 2023-12-27 ENCOUNTER — Telehealth: Payer: Self-pay | Admitting: *Deleted

## 2023-12-27 ENCOUNTER — Encounter: Payer: Self-pay | Admitting: Radiation Oncology

## 2023-12-27 NOTE — Telephone Encounter (Signed)
RETURNED PATIENT'S PHONE CALL, SPOKE WITH PATIENT'S DAUGHTER- HEATHER THOMAS

## 2024-01-01 ENCOUNTER — Ambulatory Visit (HOSPITAL_COMMUNITY): Payer: Medicare PPO | Admitting: Speech Pathology

## 2024-01-09 NOTE — Progress Notes (Signed)
 Mr. Arcadio Cope presents today for a follow up to review PET scan results. He completed radiation therapy to his right tonsil on 10/19/2023.  Patient says he is doing well and healing well, denies any problems.  Pain issues, if any: Patient denies having any throat pain. Sausage, egg and jelly biscuit, lunch and dinner  including snacks. Patient drinks water a Using a feeding tube?: Patient denies  Weight changes, if any: Patients weight has stayed stable around 180. Swallowing issues, if any: Patient denies any swallowing issues. Patient is able to eat and drink fluids normally.  Smoking or chewing tobacco? Patient denies.  Quit chewing tobacco Using fluoride toothpaste daily? N/A patient brushes dentures. Last ENT visit was on: August 2024 Other notable issues, if any:  None  BP 123/83 (BP Location: Right Arm, Patient Position: Sitting, Cuff Size: Normal)   Pulse 70   Temp 97.8 F (36.6 C)   Resp 20   Ht 5\' 9"  (1.753 m)   Wt 184 lb (83.5 kg)   SpO2 99%   BMI 27.17 kg/m    Wt Readings from Last 3 Encounters:  01/19/24 184 lb (83.5 kg)  11/17/23 189 lb (85.7 kg)  11/03/23 200 lb (90.7 kg)

## 2024-01-12 ENCOUNTER — Encounter (HOSPITAL_COMMUNITY)
Admission: RE | Admit: 2024-01-12 | Discharge: 2024-01-12 | Disposition: A | Discharge status: Home / Self Care | Payer: Medicare PPO | Source: Ambulatory Visit | Attending: Radiation Oncology | Admitting: Radiation Oncology

## 2024-01-12 DIAGNOSIS — C090 Malignant neoplasm of tonsillar fossa: Secondary | ICD-10-CM | POA: Diagnosis present

## 2024-01-12 LAB — GLUCOSE, CAPILLARY: Glucose-Capillary: 108 mg/dL — ABNORMAL HIGH (ref 70–99)

## 2024-01-12 MED ORDER — FLUDEOXYGLUCOSE F - 18 (FDG) INJECTION
8.9000 | Freq: Once | INTRAVENOUS | Status: AC
  Administered 2024-01-12: 8.9 via INTRAVENOUS

## 2024-01-15 ENCOUNTER — Other Ambulatory Visit: Payer: Self-pay | Admitting: Adult Health

## 2024-01-15 NOTE — Telephone Encounter (Signed)
 Last seen on 02/02/23 Follow up scheduled on 02/08/24

## 2024-01-19 ENCOUNTER — Other Ambulatory Visit: Payer: Self-pay

## 2024-01-19 ENCOUNTER — Ambulatory Visit
Admission: RE | Admit: 2024-01-19 | Discharge: 2024-01-19 | Disposition: A | Discharge status: Home / Self Care | Payer: Self-pay | Source: Ambulatory Visit | Attending: Radiation Oncology | Admitting: Radiation Oncology

## 2024-01-19 VITALS — BP 123/83 | HR 70 | Temp 97.8°F | Resp 20 | Ht 69.0 in | Wt 184.0 lb

## 2024-01-19 DIAGNOSIS — Z951 Presence of aortocoronary bypass graft: Secondary | ICD-10-CM | POA: Insufficient documentation

## 2024-01-19 DIAGNOSIS — C090 Malignant neoplasm of tonsillar fossa: Secondary | ICD-10-CM

## 2024-01-19 DIAGNOSIS — Z7902 Long term (current) use of antithrombotics/antiplatelets: Secondary | ICD-10-CM | POA: Diagnosis not present

## 2024-01-19 DIAGNOSIS — R5381 Other malaise: Secondary | ICD-10-CM

## 2024-01-19 DIAGNOSIS — Z79899 Other long term (current) drug therapy: Secondary | ICD-10-CM | POA: Insufficient documentation

## 2024-01-19 DIAGNOSIS — Z923 Personal history of irradiation: Secondary | ICD-10-CM | POA: Insufficient documentation

## 2024-01-19 DIAGNOSIS — Z7984 Long term (current) use of oral hypoglycemic drugs: Secondary | ICD-10-CM | POA: Insufficient documentation

## 2024-01-19 NOTE — Progress Notes (Signed)
 Oncology Nurse Navigator Documentation   Per patient's 3/7 post-treatment follow-up with Dr. Basilio Cairo, sent fax to [facility] Scheduling with request Arthur Cisneros be contacted and scheduled for routine post-RT follow-up in 3 months with Dr. Jenne Pane.  Notification of successful fax transmission received.   I met him briefly today after he saw Dr. Basilio Cairo. He has been scheduled for a 6 month follow up with Arthur Faster PA to go over repeat scans done prior to the appointment. Him and his family know to call me if they have any questions or concerns before then.   Hedda Slade RN, BSN, OCN Head & Neck Oncology Nurse Navigator [facility] Phone # [phone]  Fax # [phone]

## 2024-01-19 NOTE — Progress Notes (Signed)
 Radiation Oncology         [phone]) [phone] ________________________________  Name: Arthur Cisneros. MRN: [id number]  Date: 01/19/2024  DOB: December 02, 1955  Follow-Up Visit Note  CC: Inc, [employer], [facility]  Diagnosis and Prior Radiotherapy:       ICD-10-CM   1. Primary cancer of tonsillar fossa (HCC)  C09.0      Plan Name: HN_R_tonsil Site: Tonsil, Right Technique: IMRT Mode: Photon Dose Per Fraction: 2 Gy Prescribed Dose (Delivered / Prescribed): 70 Gy / 70 Gy Prescribed Fxs (Delivered / Prescribed): 35 / 35   CHIEF COMPLAINT:  Here for follow-up and surveillance of throat cancer  Narrative:    [name] presents today for a follow up to review PET scan results. He completed radiation therapy to his right tonsil on 10/19/2023.  Patient says he is doing well and healing well, denies any problems.  Pain issues, if any: Patient denies having any throat pain. Sausage, egg and jelly biscuit, lunch and dinner  including snacks. Patient drinks water a Using a feeding tube?: Patient denies  Weight changes, if any: Patients weight has stayed stable around 180. Swallowing issues, if any: Patient denies any swallowing issues. Patient is able to eat and drink fluids normally.  Smoking or chewing tobacco? Patient denies.  Quit chewing tobacco Using fluoride toothpaste daily? N/A patient brushes dentures. Last ENT visit was on: August 2024 Other notable issues, if any:  None  BP 123/83 (BP Location: Right Arm, Patient Position: Sitting, Cuff Size: Normal)   Pulse 70   Temp 97.8 F (36.6 C)   Resp 20   Ht 5\' 9"  (1.753 m)   Wt 184 lb (83.5 kg)   SpO2 99%   BMI 27.17 kg/m                        ALLERGIES:  is allergic to 5-alpha reductase inhibitors, lyrica [pregabalin], motrin [ibuprofen], and tramadol.  Meds: Current Outpatient Medications  Medication Sig Dispense Refill   acetaminophen (TYLENOL) 500 MG tablet Take 1,000 mg by mouth every 6  (six) hours as needed (pain).      amLODipine (NORVASC) 5 MG tablet Take 5 mg by mouth daily.     atorvastatin (LIPITOR) 40 MG tablet Take 40 mg by mouth at bedtime.      cholecalciferol (VITAMIN D) 25 MCG (1000 UNIT) tablet Take 1,000 Units by mouth daily.     clopidogrel (PLAVIX) 75 MG tablet Take 75 mg by mouth daily.     fluticasone (FLONASE) 50 MCG/ACT nasal spray Place 1 spray into both nostrils daily.     HYDROcodone-acetaminophen (NORCO/VICODIN) 5-325 MG tablet Take 1 tablet by mouth 4 (four) times daily as needed for moderate pain (pain score 4-6). 20 tablet 0   levETIRAcetam (KEPPRA) 250 MG tablet Take 1 tablet (250 mg total) by mouth 2 (two) times daily. 180 tablet 3   lidocaine (XYLOCAINE) 2 % solution Patient: Mix 1part 2% viscous lidocaine, 1part H20. Swish & swallow 10mL of diluted mixture, before meals and at bedtime, up to QID 200 mL 3   losartan (COZAAR) 25 MG tablet Take 25 mg by mouth daily.     metFORMIN (GLUCOPHAGE-XR) 500 MG 24 hr tablet Take 1,000 mg by mouth 2 (two) times a day.      nitroGLYCERIN (NITROSTAT) 0.4 MG SL tablet DISSOLVE (1) TABLET UNDER TONGUE AS NEEDED TO RELIEVE CHEST PAIN. MAY REPEAT EVERY 5 MINUTES. (  if no relief after 2nd dose, proceed to the ED for an evaluation or call 911). 25 tablet 3   tiZANidine (ZANAFLEX) 2 MG tablet TAKE 1-2 TABLETS BY MOUTH AT BEDTIME AS NEEDED 90 tablet 0   vitamin B-12 (CYANOCOBALAMIN) 1000 MCG tablet Take 1,000 mcg by mouth daily.     ZETIA 10 MG tablet Take 10 mg by mouth daily.     No current facility-administered medications for this encounter.    Physical Findings: Gen: The patient is in no acute distress. Patient is alert and oriented. Wt Readings from Last 3 Encounters:  01/19/24 184 lb (83.5 kg)  11/17/23 189 lb (85.7 kg)  11/03/23 200 lb (90.7 kg)    height is 5\' 9"  (1.753 m) and weight is 184 lb (83.5 kg). His temperature is 97.8 F (36.6 C). His blood pressure is 123/83 and his pulse is 70. His  respiration is 20 and oxygen saturation is 99%. Marland Kitchen    HEENT: No thrush or signs of tumor. 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 Tonsillar carcinoma. Radiation treatment completed 4 months prior. EXAM: NUCLEAR MEDICINE PET SKULL BASE TO THIGH TECHNIQUE: 8.9 mCi F-18 FDG was injected intravenously. Full-ring PET imaging was performed from the skull base to thigh after the radiotracer. CT data was obtained and used for attenuation correction and anatomic localization. Fasting blood glucose: 108 mg/dl COMPARISON:  PET-CT 16/08/9603 FINDINGS: Mediastinal blood pool activity: SUV max 3.0 Liver activity: SUV max NA NECK: encephalomalacia in the RIGHT temporal lobe consistent prior infarction No hypermetabolic activity in the oropharynx or hypopharynx. No hypermetabolic cervical lymph nodes. Resolution of previous hypermetabolic RIGHT tonsillar mass with level 2 necrotic nodes on the RIGHT. Incidental CT findings: None CHEST: No hypermetabolic pulmonary nodules. No hypermetabolic mediastinal lymph nodes. Nodular thickening along the medial LEFT upper lobe along the mediastinal surface (image 70/4) is unchanged from prior and is not hypermetabolic. This may relate to prior CABG. Incidental CT findings: Post CABG ABDOMEN/PELVIS: No abnormal hypermetabolic activity within the liver, pancreas, adrenal glands, or spleen. No hypermetabolic lymph nodes in the abdomen or pelvis. Incidental CT findings: Chronic severe RIGHT  hydronephrosis and hydroureter. Obstructing calculus in the distal RIGHT ureter. Severe renal cortical thinning on the RIGHT. No accumulation of radiotracer in the RIGHT kidney. No change from prior. SKELETON: No focal hypermetabolic activity to suggest skeletal metastasis. Incidental CT findings: None. IMPRESSION: 1. No evidence of recurrent tonsillar carcinoma. 2. No evidence of metastatic adenopathy in the neck. 3. No evidence distant metastatic disease. 4. Chronic severe RIGHT hydronephrosis and hydroureter with obstructing calculus in the distal RIGHT ureter. Electronically Signed   By: Genevive Bi M.D.   On: 01/12/2024 14:47    Impression/Plan:   Cancer Staging  Primary cancer of tonsillar fossa (HCC) Staging form: Pharynx - HPV-Mediated Oropharynx, AJCC 8th Edition - Clinical stage from 08/08/2023: Stage I (cT2, cN1, cM0, p16+) - Signed by Lonie Peak, MD on 08/09/2023 Stage prefix:  Initial diagnosis  I reviewed his PET scans with the patient and his family today, comparing his pretreatment and posttreatment images.  He is in remission, doing well from a symptomatic and nutritional standpoint.  He has been discussed at our tumor board.  He still has some fluid-filled lymph nodes that are visible on the right neck but these are most likely scar tissue.  I recommended that he wait another 3 months until he gets refitted for dentures as the anatomy of his mouth may continue to evolve  Lab Results  Component Value Date   TSH 1.413 08/10/2023  (Check TSH annually)  Followup plans: F/u in 60mo w Bates CT neck and chest with contrast in 38mo, and F/u in 38mo w Joyice Faster on a Tuesday, for imaging review. TSH same day.      On date of service, in total, I spent 30 minutes on this encounter. Patient was seen in person. _____________________________________   Lonie Peak, MD

## 2024-01-22 ENCOUNTER — Encounter: Payer: Self-pay | Admitting: Radiation Oncology

## 2024-02-07 ENCOUNTER — Telehealth: Payer: Self-pay | Admitting: Adult Health

## 2024-02-07 NOTE — Telephone Encounter (Signed)
 MYC confirmation

## 2024-02-07 NOTE — Progress Notes (Unsigned)
 [facility]  PATIENT: Arthur Cisneros. DOB: 1956/10/01   REASON FOR VISIT: Follow-up for history of stroke and post stroke seizure HISTORY FROM: Patient  Chief complaint: No chief complaint on file.     HISTORY OF PRESENT ILLNESS:  Arthur Juba. is a 68 y.o. 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  Denies new or worsening stroke/TIA symptoms for seizure activity.  Residual deficits stable.  Continued use of tizanidine nightly with benefit.  Ambulates with cane and AFO brace, denies any recent falls.  Compliant on Plavix and atorvastatin.  Compliant on Keppra 250 mg twice daily.  Tolerating medications well.  Blood pressure well-controlled.  Routinely follows with PCP.  Repeat carotid ultrasound showed worsening left ICA stenosis therefore referred to vascular surgery who did not feel surgical intervention needed as he remains asymptomatic and currently continuing with medical management, plans on repeat carotid duplex around 03/2023.       REVIEW OF SYSTEMS: Full 14 system review of systems performed and notable only for those listed in HPI  and all other systems negative  ALLERGIES: Allergies  Allergen Reactions   5-Alpha Reductase Inhibitors    Lyrica [Pregabalin]     hallucination   Motrin [Ibuprofen]     "dr said I can not take it."   Tramadol     Hallucinations, "legs jumping", increased appetite    HOME MEDICATIONS: Outpatient Medications Prior to Visit  Medication Sig Dispense Refill   acetaminophen (TYLENOL) 500 MG tablet Take 1,000 mg by mouth every 6 (six) hours as needed (pain).      amLODipine (NORVASC) 5 MG tablet Take 5 mg by mouth daily.     atorvastatin (LIPITOR) 40 MG tablet Take 40 mg by mouth at bedtime.       cholecalciferol (VITAMIN D) 25 MCG (1000 UNIT) tablet Take 1,000 Units by mouth daily.     clopidogrel (PLAVIX) 75 MG tablet Take 75 mg by mouth daily.     fluticasone (FLONASE) 50 MCG/ACT nasal spray Place 1 spray into both nostrils daily.     HYDROcodone-acetaminophen (NORCO/VICODIN) 5-325 MG tablet Take 1 tablet by mouth 4 (four) times daily as needed for moderate pain (pain score 4-6). 20 tablet 0   levETIRAcetam (KEPPRA) 250 MG tablet Take 1 tablet (250 mg total) by mouth 2 (two) times daily. 180 tablet 3   lidocaine (XYLOCAINE) 2 % solution Patient: Mix 1part 2% viscous lidocaine, 1part H20. Swish & swallow 10mL of diluted mixture, before meals and at bedtime, up to QID 200 mL 3   losartan (COZAAR) 25 MG tablet Take 25 mg by mouth daily.     metFORMIN (GLUCOPHAGE-XR) 500 MG 24 hr tablet Take 1,000 mg by mouth 2 (two) times a day.      nitroGLYCERIN (NITROSTAT) 0.4 MG SL tablet DISSOLVE (1) TABLET UNDER TONGUE AS NEEDED TO RELIEVE CHEST PAIN. MAY REPEAT EVERY 5 MINUTES. (if no relief after 2nd dose, proceed to the ED for an evaluation or call 911). 25 tablet 3   tiZANidine (ZANAFLEX) 2 MG tablet TAKE 1-2 TABLETS BY MOUTH AT BEDTIME AS NEEDED 90 tablet 0   vitamin B-12 (CYANOCOBALAMIN) 1000 MCG tablet Take 1,000 mcg by mouth daily.     ZETIA 10 MG tablet Take 10  mg by mouth daily.     No facility-administered medications prior to visit.    PAST MEDICAL HISTORY: Past Medical History:  Diagnosis Date   Carotid artery disease (HCC)    Coronary artery disease    Multivessel disease status post CABG in 2000 at [facility]   Depression    Essential hypertension    Hyperlipidemia    Nephrolithiasis    Prostate cancer (HCC) 2016   Status post XRT   Sepsis (HCC) 06/11/2019   Stroke [facility]) 2011   Right MCA infarct   Type 2 diabetes mellitus (HCC)     PAST SURGICAL HISTORY: Past Surgical History:  Procedure Laterality Date   CORONARY ARTERY BYPASS GRAFT     4 graft    CYSTOSCOPY WITH STENT PLACEMENT Right 06/11/2019   Procedure: CYSTOSCOPY WITH STENT PLACEMENT;  Surgeon: Crista Elliot, MD;  Location: [facility];  Service: Urology;  Laterality: Right;   CYSTOSCOPY/URETEROSCOPY/HOLMIUM LASER/STENT PLACEMENT Right 06/06/2019   Procedure: CYSTOSCOPY/URETEROSCOPY/HOLMIUM LASER/STENT PLACEMENT;  Surgeon: Heloise Purpura, MD;  Location: [facility];  Service: Urology;  Laterality: Right;   CYSTOSCOPY/URETEROSCOPY/HOLMIUM LASER/STENT PLACEMENT Right 07/15/2019   Procedure: CYSTOSCOPY/URETEROSCOPY/STENT PLACEMENT;  Surgeon: Heloise Purpura, MD;  Location: [facility];  Service: Urology;  Laterality: Right;   CYSTOSCOPY/URETEROSCOPY/HOLMIUM LASER/STENT PLACEMENT Bilateral 01/30/2020   Procedure: CYSTOSCOPY/RETROGRADE/URETEROSCOPY /RIGHT STENT PLACEMENT;  Surgeon: Heloise Purpura, MD;  Location: [facility];  Service: Urology;  Laterality: Bilateral;   PROSTATE BIOPSY     right hand surgery      FAMILY HISTORY: Family History  Problem Relation Age of Onset   Heart disease Mother    Diabetes Mother    Arthritis Mother    Heart disease Father    Diabetes Father    Stroke Father    Cancer Paternal Uncle    Stroke Other    Gout Other    Coronary artery disease Other     SOCIAL HISTORY: Social History   Socioeconomic History   Marital status: Married    Spouse name: Arthur Cisneros   Number of children: 5   Years of education: 8th    Highest education level: Not on file  Occupational History    Employer: RETIRED  Tobacco Use   Smoking status: Former    Current packs/day: 0.00    Average packs/day: 0.3 packs/day for 15.0 years (3.8 ttl pk-yrs)    Types: Cigarettes    Start date: 10/19/1970    Quit date: 10/19/1985    Years since quitting: 38.3   Smokeless tobacco: Current    Types: Chew  Vaping Use   Vaping status: Never Used  Substance and Sexual Activity   Alcohol use: Not Currently    Alcohol/week: 0.0 standard drinks of alcohol   Drug use: No   Sexual activity: Not  Currently  Other Topics Concern   Not on file  Social History Narrative   Patient lives at home with wife Arthur Cisneros.    Patient has 5 children.    Patient has a 8th grade.    Patient is retired.    Patient is on disability.    Patient is right handed.    Patient drinks caffeine every once in a while.    Social Drivers of Corporate investment banker Strain: Not on file  Food Insecurity: Low Risk  (07/20/2023)   Received from [facility]   Hunger Vital Sign    Worried About Running Out of Food in the Last Year: Never true    Ran Out  of Food in the Last Year: Never true  Transportation Needs: No Transportation Needs (07/20/2023)   Received from [facility]    In the past 12 months, has lack of reliable transportation kept you from medical appointments, meetings, work or from getting things needed for daily living? : No  Physical Activity: Not on file  Stress: Not on file  Social Connections: Not on file  Intimate Partner Violence: Not on file     PHYSICAL EXAM  There were no vitals filed for this visit.    There is no height or weight on file to calculate BMI.  General: well developed, well nourished, pleasant middle aged Caucasian male, seated, in no evident distress Head: head normocephalic and atraumatic.   Neck: supple with no carotid or supraclavicular bruits Cardiovascular: regular rate and rhythm, no murmurs Musculoskeletal: no deformity Skin:  no rash/petichiae Vascular:  Normal pulses all extremities   Neurologic Exam Mental Status: Awake and fully alert.   Mild dysarthria.  Oriented to place and time. Recent and remote memory intact. Attention span, concentration and fund of knowledge appropriate. Mood and affect appropriate.  Cranial Nerves: Fundoscopic exam reveals sharp disc margins. Pupils equal, briskly reactive to light. Extraocular movements full without nystagmus. Visual fields full to confrontation. Hearing intact. Facial sensation intact.  Left lower facial weakness.  Motor:  LUE: 3/5 deltoid and bicep. 2/5 tricep; no grip strength with increased tone throughout.  Limited shoulder ROM  LLE: 4+/5 proximal with foot drop and use of AFO brace Full strength right upper and lower extremity Sensory.: intact to touch , pinprick , position and vibratory sensation.  Coordination: Rapid alternating movements normal in all extremities except limited on left side. Finger-to-nose and heel-to-shin performed accurately on right side Gait and Station: Arises from chair without difficulty. Stance is normal. Gait demonstrates  hemiplegic gait with use of cane and AFO brace Reflexes: 1+ and symmetric. Toes downgoing.      DIAGNOSTIC DATA (LABS, IMAGING, TESTING) - I reviewed patient records, labs, notes, testing and imaging myself where available.  Lab Results  Component Value Date   WBC 8.5 08/10/2023   HGB 15.1 08/10/2023   HCT 44.6 08/10/2023   MCV 90.8 08/10/2023   PLT 240 08/10/2023      Component Value Date/Time   NA 138 11/17/2023 0851   K 3.3 (L) 11/17/2023 0851   CL 104 11/17/2023 0851   CO2 26 11/17/2023 0851   GLUCOSE 131 (H) 11/17/2023 0851   BUN 14 11/17/2023 0851   CREATININE 1.09 11/17/2023 0851   CALCIUM 8.7 (L) 11/17/2023 0851   PROT 7.3 08/10/2023 1034   ALBUMIN 4.4 08/10/2023 1034   AST 14 (L) 08/10/2023 1034   ALT 23 08/10/2023 1034   ALKPHOS 41 08/10/2023 1034   BILITOT 0.4 08/10/2023 1034   GFRNONAA >60 11/17/2023 0851   GFRAA >60 01/23/2020 1327       ASSESSMENT AND PLAN 68 y.o. year old male  has a past medical history of Hyperlipidemia; Hypertension; Stroke;  Diabetes mellitus; Coronary artery disease; Anxiety; and Prostate cancer (05-2015). And seizure disorder in excellent control on Keppra.  Residual stroke deficits of left spastic hemiparesis and dysarthria   Seizure disorder -No additional seizure events -Continue Keppra 250 mg twice daily for seizure prophylaxis -refill  provided -discussed tapering off medication as he has been seizure free for years but declines interest at this time -Discussed avoidance of seizure provoking triggers -reports routine lab work including CMP and CBC  by PCP   Hx of stroke -Residual spastic hemiparesis -use of tizanidine 2mg  1-2 tabs at night as needed for spasticity -Continue Plavix and atorvastatin for secondary stroke prevention -Discussed secondary stroke prevention measures and importance of close PCP follow-up for aggressive stroke risk factor management -Maintain strict control of hypertension with blood pressure goal below 130/90, lipids with LDL cholesterol goal below 70 mg percent and diabetes with hemoglobin A1c goal below 7%.  -Reports recent lab work by PCP which was satisfactory  B/l carotid artery stenosis -Carotid ultrasound 09/2022 left ICA 60 to 79% stenosis and right ICA occlusion -2020 and 2022 CUS:  left ICA 1 to 39% stenosis and right ICA occlusion -Followed by vascular surgery -plan for continued medical management and repeat carotid duplex around 03/2023 for surveillance monitoring    Follow-up in 1 year or call earlier if needed    CC:  Inc, [employer]    I spent 31 minutes of face-to-face and non-face-to-face time with patient and wife.  This included previsit chart review, lab review, study review, order entry, electronic health record documentation, patient education and discussion regarding above diagnoses and treatment plan and answered all other questions to patient's satisfaction   Ihor Austin, [facility]  [facility] [address] [place], [place] [address]  Phone [phone] Fax [phone] Note: This document was prepared with digital dictation and possible smart phrase technology. Any transcriptional errors that result from this process are unintentional.

## 2024-02-08 ENCOUNTER — Ambulatory Visit: Payer: Medicare PPO | Admitting: Adult Health

## 2024-02-08 ENCOUNTER — Encounter: Payer: Self-pay | Admitting: Adult Health

## 2024-02-08 VITALS — BP 146/89 | HR 67 | Ht 69.0 in | Wt 184.0 lb

## 2024-02-08 DIAGNOSIS — I6522 Occlusion and stenosis of left carotid artery: Secondary | ICD-10-CM | POA: Diagnosis not present

## 2024-02-08 DIAGNOSIS — G40909 Epilepsy, unspecified, not intractable, without status epilepticus: Secondary | ICD-10-CM

## 2024-02-08 DIAGNOSIS — I6521 Occlusion and stenosis of right carotid artery: Secondary | ICD-10-CM

## 2024-02-08 DIAGNOSIS — Z8673 Personal history of transient ischemic attack (TIA), and cerebral infarction without residual deficits: Secondary | ICD-10-CM | POA: Diagnosis not present

## 2024-02-08 MED ORDER — GABAPENTIN 300 MG PO CAPS: 300.0000 mg | ORAL_CAPSULE | Freq: Two times a day (BID) | ORAL | 11 refills | Status: AC | PRN

## 2024-02-08 MED ORDER — LEVETIRACETAM 250 MG PO TABS: 250.0000 mg | ORAL_TABLET | Freq: Two times a day (BID) | ORAL | 3 refills | Status: AC

## 2024-02-08 MED ORDER — TIZANIDINE HCL 2 MG PO TABS
2.0000 mg | ORAL_TABLET | Freq: Every evening | ORAL | 5 refills | Status: AC | PRN
Start: 2024-02-08 — End: ?

## 2024-02-08 NOTE — Patient Instructions (Addendum)
 Continue Keppra 250 mg twice daily -please call with any seizure activity  Continue tizanidine as needed  Start gabapentin 300mg  twice daily as needed - start taking just a night first and gradually increase to daytime if needed  Continue clopidogrel 75 mg daily  and atorvastatin for secondary stroke prevention  Continue to follow with vascular surgery for routine monitoring of carotid stenosis  Continue to follow up with PCP regarding blood pressure and cholesterol management  Maintain strict control of hypertension with blood pressure goal below 130/90 and cholesterol with LDL cholesterol (bad cholesterol) goal below 70 mg/dL.   Signs of a Stroke? Follow the BEFAST method:  Balance Watch for a sudden loss of balance, trouble with coordination or vertigo Eyes Is there a sudden loss of vision in one or both eyes? Or double vision?  Face: Ask the person to smile. Does one side of the face droop or is it numb?  Arms: Ask the person to raise both arms. Does one arm drift downward? Is there weakness or numbness of a leg? Speech: Ask the person to repeat a simple phrase. Does the speech sound slurred/strange? Is the person confused ? Time: If you observe any of these signs, call 911.      Followup in the future with me in 1 year or call earlier if needed       Thank you for coming to see [place] at [facility]. I hope we have been able to provide you high quality care today.  You may receive a patient satisfaction survey over the next few weeks. We would appreciate your feedback and comments so that we may continue to improve ourselves and the health of our patients.

## 2024-06-05 ENCOUNTER — Ambulatory Visit: Attending: Cardiology | Admitting: Cardiology

## 2024-06-05 ENCOUNTER — Encounter: Payer: Self-pay | Admitting: Cardiology

## 2024-06-05 VITALS — BP 132/82 | HR 61 | Ht 69.0 in | Wt 181.0 lb

## 2024-06-05 DIAGNOSIS — I6523 Occlusion and stenosis of bilateral carotid arteries: Secondary | ICD-10-CM

## 2024-06-05 DIAGNOSIS — I25119 Atherosclerotic heart disease of native coronary artery with unspecified angina pectoris: Secondary | ICD-10-CM

## 2024-06-05 DIAGNOSIS — E782 Mixed hyperlipidemia: Secondary | ICD-10-CM | POA: Diagnosis not present

## 2024-06-05 NOTE — Patient Instructions (Addendum)

## 2024-06-05 NOTE — Progress Notes (Signed)
    Cardiology Office Note  Date: 06/05/2024   ID: Arthur Cisneros., DOB May 29, 1956, MRN [id number]  History of Present Illness: Arthur Harju. is a 68 y.o. male last seen in September 2024.  He is here today with his daughter for a follow-up visit.  Reports no angina or interval nitroglycerin  use.  He is using a cane to ambulate, stable poststroke sequela as before.  He did undergo treatment for tonsillar cancer in the interim including resection and XRT.  He still follows with [facility].  We went over his medications.  No changes from a cardiac perspective.  He will have lab work with PCP next month.  Also has follow-up scheduled with [facility].  I reviewed his ECG today which shows normal sinus rhythm with RSR' in lead V1.  Physical Exam: VS:  BP 132/82 (BP Location: Right Arm)   Pulse 61   Ht 5' 9 (1.753 m)   Wt 181 lb (82.1 kg)   SpO2 97%   BMI 26.73 kg/m , BMI Body mass index is 26.73 kg/m.  Wt Readings from Last 3 Encounters:  06/05/24 181 lb (82.1 kg)  02/08/24 184 lb (83.5 kg)  01/19/24 184 lb (83.5 kg)    General: Patient appears comfortable at rest. HEENT: Conjunctiva and lids normal. Neck: Supple, no elevated JVP or carotid bruits. Lungs: Clear to auscultation, nonlabored breathing at rest. Cardiac: Regular rate and rhythm, no S3 or significant systolic murmur.  ECG:  An ECG dated 08/11/2023 was personally reviewed today and demonstrated:  Sinus rhythm with RSR' in lead V1 and old inferior infarct pattern.  Labwork: June 2024: Cholesterol 101, triglycerides 125, HDL 31, LDL 47 08/10/2023: ALT 23; AST 14; Hemoglobin 15.1; Platelets 240; TSH 1.413 11/17/2023: BUN 14; Creatinine 1.09; Potassium 3.3; Sodium 138   Other Studies Reviewed Today:  No interval cardiac testing for review today.  Assessment and Plan:  1.  Multivessel CAD status post four-vessel CABG in 2000 at [facility].  Lexiscan  Myoview in 2016 showed inferior/inferolateral scar without active  ischemia and LVEF 65%.  He does not report any angina or interval nitroglycerin  use.  ECG reviewed and stable.  Continue Plavix  75 mg daily, Lipitor 40 mg daily, Zetia 10 mg daily, and as needed nitroglycerin .   2.  Mixed hyperlipidemia.  LDL 47 in June 2024.  Continue Lipitor 40 mg daily and Zetia 10 mg daily.  He will have follow-up lab with PCP next month.   3.  Carotid artery disease, chronically occluded RICA and 65% proximal LICA stenosis by CTA in August 2024.  He follows with Dr. Magda at [facility].  Asymptomatic.   Disposition:  Follow up 6 months.  Signed, Jayson JUDITHANN Sierras, M.D., F.A.C.C. [facility]

## 2024-07-04 ENCOUNTER — Telehealth: Payer: Self-pay | Admitting: *Deleted

## 2024-07-04 NOTE — Telephone Encounter (Signed)
 Called patient to inform of CT for 07-12-24- arrival time- 1:30 pm @ [facility], no restrictions to scan, patient to fu with PA, Ronita Due on 07-23-24 - for lab and fu, spoke with patient's daughter- Powell Ned and she is aware of these appts. and the instructions

## 2024-07-08 ENCOUNTER — Other Ambulatory Visit: Payer: Self-pay

## 2024-07-08 DIAGNOSIS — C090 Malignant neoplasm of tonsillar fossa: Secondary | ICD-10-CM

## 2024-07-12 ENCOUNTER — Ambulatory Visit
Admission: RE | Admit: 2024-07-12 | Discharge: 2024-07-12 | Disposition: A | Discharge status: Home / Self Care | Source: Ambulatory Visit | Attending: Internal Medicine | Admitting: Internal Medicine

## 2024-07-12 ENCOUNTER — Ambulatory Visit (HOSPITAL_COMMUNITY)
Admission: RE | Admit: 2024-07-12 | Discharge: 2024-07-12 | Disposition: A | Discharge status: Home / Self Care | Source: Ambulatory Visit | Attending: Radiation Oncology | Admitting: Radiation Oncology

## 2024-07-12 DIAGNOSIS — I6521 Occlusion and stenosis of right carotid artery: Secondary | ICD-10-CM

## 2024-07-12 DIAGNOSIS — C090 Malignant neoplasm of tonsillar fossa: Secondary | ICD-10-CM

## 2024-07-12 LAB — BASIC METABOLIC PANEL - CANCER CENTER ONLY
Anion gap: 6 (ref 5–15)
BUN: 20 mg/dL (ref 8–23)
CO2: 29 mmol/L (ref 22–32)
Calcium: 9.9 mg/dL (ref 8.9–10.3)
Chloride: 104 mmol/L (ref 98–111)
Creatinine: 1.28 mg/dL — ABNORMAL HIGH (ref 0.61–1.24)
GFR, Estimated: >60 mL/min (ref >60)
Glucose, Bld: 121 mg/dL — ABNORMAL HIGH (ref 70–99)
Potassium: 4.4 mmol/L (ref 3.5–5.1)
Sodium: 139 mmol/L (ref 135–145)

## 2024-07-12 MED ORDER — IOHEXOL 300 MG/ML  SOLN
75.0000 mL | Freq: Once | INTRAMUSCULAR | Status: AC | PRN
  Administered 2024-07-12: 75 mL via INTRAVENOUS

## 2024-07-22 NOTE — Progress Notes (Signed)
 Radiation Oncology         [phone]) [phone] ________________________________  Name: Arthur Cisneros. MRN: [id number]  Date: 07/23/2024  DOB: 1956/09/16  Follow-Up Visit Note  CC: Inc, [employer], [facility]  Diagnosis and Prior Radiotherapy:     No diagnosis found. 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   Narrative:    Arthur Cisneros presents today for a follow up to review CT scan results. He completed radiation therapy to his right tonsil on 10/19/2023.   ***                       ALLERGIES:  is allergic to 5-alpha reductase inhibitors, lyrica [pregabalin], motrin [ibuprofen], and tramadol .  Meds: Current Outpatient Medications  Medication Sig Dispense Refill   acetaminophen  (TYLENOL ) 500 MG tablet Take 1,000 mg by mouth every 6 (six) hours as needed (pain).      amLODipine (NORVASC) 5 MG tablet Take 5 mg by mouth daily.     atorvastatin  (LIPITOR) 40 MG tablet Take 40 mg by mouth at bedtime.      cholecalciferol (VITAMIN D) 25 MCG (1000 UNIT) tablet Take 1,000 Units by mouth daily.     clopidogrel  (PLAVIX ) 75 MG tablet Take 75 mg by mouth daily.     fluticasone (FLONASE) 50 MCG/ACT nasal spray Place 1 spray into both nostrils daily.     gabapentin  (NEURONTIN ) 300 MG capsule Take 1 capsule (300 mg total) by mouth 2 (two) times daily  as needed. 60 capsule 11   HYDROcodone -acetaminophen  (NORCO/VICODIN) 5-325 MG tablet Take 1 tablet by mouth 4 (four) times daily as needed for moderate pain (pain score 4-6). (Patient not taking: Reported on 06/05/2024) 20 tablet 0   levETIRAcetam  (KEPPRA ) 250 MG tablet Take 1 tablet (250 mg total) by mouth 2 (two) times daily. 180 tablet 3   lidocaine  (XYLOCAINE ) 2 % solution Patient: Mix 1part 2% viscous lidocaine , 1part H20. Swish & swallow 10mL of diluted mixture, before meals and at bedtime, up to QID (Patient not taking: Reported on 06/05/2024) 200 mL 3   losartan (COZAAR) 25 MG tablet Take 25 mg by mouth daily.     metFORMIN (GLUCOPHAGE-XR) 500 MG 24 hr tablet Take 1,000 mg by mouth 2 (two) times a day.      nitroGLYCERIN  (NITROSTAT ) 0.4 MG SL tablet DISSOLVE (1) TABLET UNDER TONGUE AS NEEDED TO RELIEVE CHEST PAIN. MAY REPEAT EVERY 5 MINUTES. (if no relief after 2nd dose, proceed to the ED for an evaluation or call 911). 25 tablet 3   tamsulosin (FLOMAX) 0.4 MG CAPS capsule Take 0.4 mg by mouth daily after supper.     tiZANidine  (ZANAFLEX ) 2 MG tablet Take 1-2 tablets (2-4 mg total) by mouth at bedtime as needed. 90 tablet 5   vitamin B-12 (CYANOCOBALAMIN) 1000 MCG tablet Take 1,000 mcg by  mouth daily.     ZETIA 10 MG tablet Take 10 mg by mouth daily.     No current facility-administered medications for this visit.    Physical Findings: *** Gen: The patient is in no acute distress. Patient is alert and oriented. Wt Readings from Last 3 Encounters:  06/05/24 181 lb (82.1 kg)  02/08/24 184 lb (83.5 kg)  01/19/24 184 lb (83.5 kg)    vitals were not taken for this visit. SABRA    HEENT: No thrush or signs of tumor. Full dentures NECK: no obvious masses to palpation SKIN: Skin over neck healed, it is smooth   Neuro: weakness on left side, in a wheelchair   Lab Findings: Lab Results  Component Value Date   WBC 8.5 08/10/2023   HGB 15.1 08/10/2023   HCT 44.6 08/10/2023   MCV 90.8  08/10/2023   PLT 240 08/10/2023    Lab Results  Component Value Date   TSH 1.413 08/10/2023    Radiographic Findings: CT Soft Tissue Neck W Contrast Result Date: 07/18/2024 CLINICAL DATA:  Primary carcinoma of the tonsillar fossa, follow-up EXAM: CT NECK WITH CONTRAST TECHNIQUE: Multidetector CT imaging of the neck was performed using the standard protocol following the bolus administration of intravenous contrast. RADIATION DOSE REDUCTION: This exam was performed according to the departmental dose-optimization program which includes automated exposure control, adjustment of the mA and/or kV according to patient size and/or use of iterative reconstruction technique. CONTRAST:  75mL OMNIPAQUE  IOHEXOL  300 MG/ML  SOLN COMPARISON:  July 06, 2023 CTA, January 12, 2024 CT PET FINDINGS: Pharynx: The nasopharynx, oropharynx and hypopharynx are normal Oral cavity/floor of mouth: Normal Larynx: There is medial deviation of the right vocal cord which may indicate paralysis. Salivary glands: The parotid and submandibular glands are normal Thyroid : Normal Lymph nodes: There is a 2.1 x 1.8 x 2.9 cm level 2 lymph node on the right. Just posterior to the larger node there is a 1 cm lymph node. Both were present on the prior CT PET and are not significantly changed. Vascular: There is a right internal carotid artery occlusion. There is calcified plaque in the left carotid bifurcation Limited intracranial: Encephalomalacia in the right temporal lobe Visualized orbits: No significant abnormality Mastoids and visualized paranasal sinuses: No significant abnormality Skeleton: No significant abnormality Upper chest: No significant abnormality Other: None IMPRESSION: 1. 2.1 x 1.8 x 2.9 cm right level 2 lymph node. This appears similar to the previous CT PET study from January 12, 2024. It was not hypermetabolic on that exam. 2. Approximate 1 cm rounded lymph node posterior to the larger lymph node, also not significantly  changed and not hypermetabolic on the previous CT PET study. 3. 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 Chronic occlusion of the right internal carotid artery Electronically Signed   By: Nancyann Burns M.D.   On: 07/18/2024 09:30   CT Chest W Contrast Result Date: 07/12/2024 CLINICAL DATA:  Head neck cancer, monitor. Primary cancer of tonsil fossa. * Tracking Code: BO * EXAM: CT CHEST WITH CONTRAST TECHNIQUE: Multidetector CT imaging of the chest was performed during intravenous contrast administration. RADIATION DOSE REDUCTION: This exam was performed according to the departmental dose-optimization program which includes automated exposure control, adjustment of the mA and/or kV according to patient size and/or use of iterative reconstruction technique. CONTRAST:  75mL OMNIPAQUE  IOHEXOL  300 MG/ML  SOLN COMPARISON:  Multiple priors including PET-CT January 12, 2024 FINDINGS: Cardiovascular: Aneurysmal dilation of the ascending thoracic aorta measuring 4 cm.  Aortic atherosclerosis. Three-vessel coronary artery calcifications/stents. Calcifications of the aortic valve. Four-chamber cardiac enlargement. Prior median sternotomy and CABG. No significant pericardial effusion/thickening. Mediastinum/Nodes: No suspicious thyroid  nodule. No pathologically enlarged mediastinal, hilar or axillary lymph nodes. Mild symmetric esophageal wall thickening. Lungs/Pleura: Subpleural nodule with peripheral calcifications in the anteromedial left upper lobe measures 12 mm on image 60/5 stable from prior PET-CT. Solid 3 mm right upper lobe pulmonary nodule on image 63/5 not confidently identified on prior motion degraded PET-CT. Scattered calcified granulomata. Scattered atelectasis/scarring. No pleural effusion. No pneumothorax. Upper Abdomen: Cholelithiasis. Similar severe right hydronephrosis with cortical renal atrophy. Musculoskeletal: No aggressive lytic or blastic lesion of bone. Multilevel degenerative changes spine with  bridging anterior vertebral osteophytes. Median sternotomy wires. IMPRESSION: 1. Solid 3 mm right upper lobe pulmonary nodule not confidently identified on prior motion degraded PET-CT. Suggest attention on short-term interval follow-up chest CT. 2. Subpleural nodule with peripheral calcifications in the anteromedial left upper lobe measures 12 mm, stable from prior PET-CT. 3. Mild symmetric esophageal wall thickening, which can be seen in the setting of esophagitis. 4. Similar severe right hydronephrosis with cortical renal atrophy. 5. Cholelithiasis. 6. Aneurysmal dilation of the ascending thoracic aorta measuring 4 cm. Aortic Atherosclerosis (ICD10-I70.0). Electronically Signed   By: Reyes Holder M.D.   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 Imaging demonstrates a stable right cervical lymph node and 1 cm lymph node posterior to the larger lymph node, and a lung nodule ***.   I recommended that he wait another 3 months until he gets refitted for dentures as the anatomy of his mouth may continue to evolve  Lab Results  Component Value Date   TSH 1.413 08/10/2023  (Check TSH annually)  Followup plans: F/u in 58mo w Bates CT neck and chest with contrast in 49mo, and F/u in 49mo w Ronita Due on a Tuesday, for imaging review. TSH same day. ***      On date of service, in total, I spent 30 minutes on this encounter. Patient was seen in person. _____________________________________    Leeroy Due, PA-C

## 2024-07-23 ENCOUNTER — Encounter: Payer: Self-pay | Admitting: Radiology

## 2024-07-23 ENCOUNTER — Other Ambulatory Visit: Payer: Self-pay

## 2024-07-23 ENCOUNTER — Ambulatory Visit: Payer: Self-pay | Admitting: Radiology

## 2024-07-23 ENCOUNTER — Ambulatory Visit
Admission: RE | Admit: 2024-07-23 | Discharge: 2024-07-23 | Disposition: A | Discharge status: Home / Self Care | Payer: Self-pay | Source: Ambulatory Visit | Attending: Radiology | Admitting: Radiology

## 2024-07-23 ENCOUNTER — Ambulatory Visit
Admission: RE | Admit: 2024-07-23 | Discharge: 2024-07-23 | Disposition: A | Discharge status: Home / Self Care | Source: Ambulatory Visit | Attending: Radiology | Admitting: Radiology

## 2024-07-23 ENCOUNTER — Other Ambulatory Visit: Payer: Self-pay | Admitting: Radiology

## 2024-07-23 DIAGNOSIS — C090 Malignant neoplasm of tonsillar fossa: Secondary | ICD-10-CM

## 2024-07-23 DIAGNOSIS — R5381 Other malaise: Secondary | ICD-10-CM

## 2024-07-23 LAB — TSH: TSH: 6.15 u[IU]/mL — ABNORMAL HIGH (ref 0.350–4.500)

## 2024-07-23 LAB — T4, FREE: Free T4: 0.88 ng/dL (ref 0.61–1.12)

## 2024-07-23 NOTE — Progress Notes (Signed)
 Oncology Nurse Navigator Documentation   Per patient's 07/23/24 post-treatment follow-up with Dr. Izell, sent fax to [facility] ENT Scheduling with request Mr. Kairon be contacted and scheduled for routine post-RT follow-up with Dr. Carlie in 3 months.  Notification of successful fax transmission received.   Delon Jefferson RN, BSN, OCN Head & Neck Oncology Nurse Navigator [facility] Phone # [phone]  Fax # [phone]

## 2024-07-23 NOTE — Progress Notes (Signed)
 Order has been placed - thanks Orlean!

## 2024-07-23 NOTE — Progress Notes (Addendum)
  [name] presents today for a follow up to review CT scan results. He completed radiation therapy to his right tonsil on 10/19/2023.       Pain issues, if any: Denies Using a feeding tube?: N/A Weight changes, if any:  Wt Readings from Last 3 Encounters: 07/23/24          188.8 lb  06/05/24 181 lb (82.1 kg)  02/08/24 184 lb (83.5 kg)       Swallowing issues, if any: Denies Smoking or chewing tobacco? Denies Using fluoride toothpaste daily? Dentures Last ENT visit was on: 04/15/24 Dr. Vaughan Ricker Other notable issues, if any:   BP (P) 135/82 (BP Location: Left Arm, Patient Position: Sitting)   Pulse (P) 61   Temp (!) (P) 97.3 F (36.3 C) (Temporal)   Resp (P) 18   Ht (P) 5' 9 (1.753 m)   Wt (P) 188 lb 8 oz (85.5 kg)   BMI (P) 27.84 kg/m

## 2024-08-29 ENCOUNTER — Other Ambulatory Visit: Payer: Self-pay

## 2024-08-29 DIAGNOSIS — I6523 Occlusion and stenosis of bilateral carotid arteries: Secondary | ICD-10-CM

## 2024-09-10 ENCOUNTER — Ambulatory Visit: Admitting: Vascular Surgery

## 2024-09-10 ENCOUNTER — Encounter (HOSPITAL_COMMUNITY)

## 2024-09-24 ENCOUNTER — Ambulatory Visit (HOSPITAL_COMMUNITY)
Admission: RE | Admit: 2024-09-24 | Discharge: 2024-09-24 | Disposition: A | Discharge status: Home / Self Care | Source: Ambulatory Visit | Attending: Surgery | Admitting: Surgery

## 2024-09-24 ENCOUNTER — Encounter: Payer: Self-pay | Admitting: Vascular Surgery

## 2024-09-24 ENCOUNTER — Ambulatory Visit: Admitting: Vascular Surgery

## 2024-09-24 VITALS — BP 163/77 | HR 63 | Temp 98.2°F

## 2024-09-24 DIAGNOSIS — I6522 Occlusion and stenosis of left carotid artery: Secondary | ICD-10-CM | POA: Diagnosis present

## 2024-09-24 DIAGNOSIS — I6521 Occlusion and stenosis of right carotid artery: Secondary | ICD-10-CM | POA: Insufficient documentation

## 2024-09-24 DIAGNOSIS — I6523 Occlusion and stenosis of bilateral carotid arteries: Secondary | ICD-10-CM | POA: Diagnosis present

## 2024-09-24 NOTE — Progress Notes (Signed)
 [facility] [place]  ASSESSMENT / PLAN: Arthur Cisneros. is a 68 y.o. male with occluded right internal carotid artery asymptomatic left 60-79% carotid artery stenosis.  The patient should continue best medical therapy for carotid artery stenosis including: Complete cessation from all tobacco products. Blood glucose control with goal A1c < 7%. Blood pressure control with goal blood pressure < 140/90 mmHg. Lipid reduction therapy with goal LDL-C <100 mg/dL (<29 if symptomatic from carotid artery stenosis).  Clopidogrel  75mg  PO QD. Atorvastatin  40-80mg  PO QD (or other high intensity statin therapy).   Doing well overall.  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  Counseled about left carotid artery stenosis.  We will see him again in 6 months with repeat duplex.  CHIEF COMPLAINT: Follow-up  HISTORY OF PRESENT ILLNESS: Arthur Cisneros. is a 68 y.o. male who returns to clinic for ongoing surveillance of carotid artery stenosis.  Most recently a duplex was performed which was concerning for malignant lymph node in the neck.  He had fairly stable carotid artery stenosis.  He has history of a remote stroke with left sided weakness.  He has no new neurologic symptoms.  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  09/24/24: Returns to clinic for surveillance of carotid artery stenosis.  He is undergoing radiation therapy for head neck cancer.  He feels well overall.  He reports that his treatments have been successful so far.  We reviewed his duplex in detail.  He is thankfully not suffered a stroke or TIA in the last 6 months.   Past Medical History:  Diagnosis Date   Carotid artery disease    Coronary artery disease    Multivessel disease status post CABG in 2000 at [facility]   Depression    Essential  hypertension    Hyperlipidemia    Nephrolithiasis    Prostate cancer (HCC) 2016   Status post XRT   Sepsis (HCC) 06/11/2019   Stroke [facility]) 2011   Right MCA infarct   Type 2 diabetes mellitus (HCC)     Past Surgical History:  Procedure Laterality Date   CORONARY ARTERY BYPASS GRAFT     4 graft   CYSTOSCOPY WITH STENT PLACEMENT Right 06/11/2019   Procedure: CYSTOSCOPY WITH STENT PLACEMENT;  Surgeon: Carolee Sherwood JONETTA DOUGLAS, MD;  Location: [facility];  Service: Urology;  Laterality: Right;   CYSTOSCOPY/URETEROSCOPY/HOLMIUM LASER/STENT PLACEMENT Right 06/06/2019   Procedure: CYSTOSCOPY/URETEROSCOPY/HOLMIUM LASER/STENT PLACEMENT;  Surgeon: Renda Glance, MD;  Location: [facility];  Service: Urology;  Laterality: Right;   CYSTOSCOPY/URETEROSCOPY/HOLMIUM LASER/STENT PLACEMENT Right 07/15/2019   Procedure: CYSTOSCOPY/URETEROSCOPY/STENT PLACEMENT;  Surgeon: Renda Glance, MD;  Location: [facility];  Service: Urology;  Laterality: Right;   CYSTOSCOPY/URETEROSCOPY/HOLMIUM LASER/STENT PLACEMENT Bilateral 01/30/2020   Procedure: CYSTOSCOPY/RETROGRADE/URETEROSCOPY /RIGHT STENT PLACEMENT;  Surgeon: Renda Glance, MD;  Location: [facility];  Service: Urology;  Laterality: Bilateral;   PROSTATE BIOPSY     right hand surgery      Family History  Problem Relation Age of Onset   Heart disease Mother    Diabetes Mother    Arthritis Mother    Heart disease Father    Diabetes Father    Stroke Father    Cancer Paternal Uncle    Stroke Other    Gout Other    Coronary artery disease Other  Social History   Socioeconomic History   Marital status: Married    Spouse name: [name]   Number of children: 5   Years of education: 8th    Highest education level: Not on file  Occupational History    Employer: RETIRED  Tobacco Use   Smoking status: Former    Current packs/day: 0.00    Average packs/day: 0.3 packs/day for 15.0 years (3.8 ttl pk-yrs)    Types: Cigarettes    Start date: 10/19/1970    Quit date: 10/19/1985     Years since quitting: 38.9   Smokeless tobacco: Former    Types: Associate Professor status: Never Used  Substance and Sexual Activity   Alcohol  use: Not Currently    Alcohol /week: 0.0 standard drinks of alcohol    Drug use: No   Sexual activity: Not Currently  Other Topics Concern   Not on file  Social History Narrative   Patient lives at home with wife Winton.    Patient has 5 children.    Patient has a 8th grade.    Patient is retired.    Patient is on disability.    Patient is right handed.    Patient drinks caffeine every once in a while.    Social Drivers of Corporate Investment Banker Strain: Not on file  Food Insecurity: Low Risk  (07/20/2023)   Received from [facility]   Hunger Vital Sign    Within the past 12 months, you worried that your food would run out before you got money to buy more: Never true    Within the past 12 months, the food you bought just didn't last and you didn't have money to get more. : Never true  Transportation Needs: No Transportation Needs (07/20/2023)   Received from [facility]    In the past 12 months, has lack of reliable transportation kept you from medical appointments, meetings, work or from getting things needed for daily living? : No  Physical Activity: Not on file  Stress: Not on file  Social Connections: Not on file  Intimate Partner Violence: Not on file    Allergies  Allergen Reactions   5-Alpha Reductase Inhibitors    Lyrica [Pregabalin]     hallucination   Motrin [Ibuprofen]     dr said I can not take it.   Tramadol      Hallucinations, legs jumping, increased appetite    Current Outpatient Medications  Medication Sig Dispense Refill   acetaminophen  (TYLENOL ) 500 MG tablet Take 1,000 mg by mouth every 6 (six) hours as needed (pain).      amLODipine (NORVASC) 5 MG tablet Take 5 mg by mouth daily.     atorvastatin  (LIPITOR) 40 MG tablet Take 40 mg by mouth at bedtime.       cholecalciferol (VITAMIN D) 25 MCG (1000 UNIT) tablet Take 1,000 Units by mouth daily.     clopidogrel  (PLAVIX ) 75 MG tablet Take 75 mg by mouth daily.     fluticasone (FLONASE) 50 MCG/ACT nasal spray Place 1 spray into both nostrils daily.     levETIRAcetam  (KEPPRA ) 250 MG tablet Take 1 tablet (250 mg total) by mouth 2 (two) times daily. 180 tablet 3   losartan (COZAAR) 25 MG tablet Take 25 mg by mouth daily.     metFORMIN (GLUCOPHAGE-XR) 500 MG 24 hr tablet Take 1,000 mg by mouth 2 (two) times a day.      nitroGLYCERIN  (NITROSTAT ) 0.4 MG  SL tablet DISSOLVE (1) TABLET UNDER TONGUE AS NEEDED TO RELIEVE CHEST PAIN. MAY REPEAT EVERY 5 MINUTES. (if no relief after 2nd dose, proceed to the ED for an evaluation or call 911). 25 tablet 3   tamsulosin (FLOMAX) 0.4 MG CAPS capsule Take 0.4 mg by mouth daily after supper.     tiZANidine  (ZANAFLEX ) 2 MG tablet Take 1-2 tablets (2-4 mg total) by mouth at bedtime as needed. 90 tablet 5   vitamin B-12 (CYANOCOBALAMIN) 1000 MCG tablet Take 1,000 mcg by mouth daily.     ZETIA 10 MG tablet Take 10 mg by mouth daily.     gabapentin  (NEURONTIN ) 300 MG capsule Take 1 capsule (300 mg total) by mouth 2 (two) times daily as needed. (Patient not taking: Reported on 07/23/2024) 60 capsule 11   HYDROcodone -acetaminophen  (NORCO/VICODIN) 5-325 MG tablet Take 1 tablet by mouth 4 (four) times daily as needed for moderate pain (pain score 4-6). (Patient not taking: Reported on 06/05/2024) 20 tablet 0   lidocaine  (XYLOCAINE ) 2 % solution Patient: Mix 1part 2% viscous lidocaine , 1part H20. Swish & swallow 10mL of diluted mixture, before meals and at bedtime, up to QID (Patient not taking: Reported on 06/05/2024) 200 mL 3   No current facility-administered medications for this visit.    PHYSICAL EXAM Vitals:   09/24/24 1245 09/24/24 1247  BP: (!) 175/102 (!) 163/77  Pulse: 63   Temp: 98.2 F (36.8 C)   SpO2: 96%     Elderly man in no distress Regular rate and  rhythm Unlabored breathing Left-sided weakness.   Able to ambulate with a cane    PERTINENT LABORATORY AND RADIOLOGIC DATA  Most recent CBC    Latest Ref Rng & Units 08/10/2023   10:34 AM 01/23/2020    1:27 PM 07/09/2019   11:03 AM  CBC  WBC 4.0 - 10.5 K/uL 8.5  7.8  8.0   Hemoglobin 13.0 - 17.0 g/dL 84.8  85.1  85.1   Hematocrit 39.0 - 52.0 % 44.6  45.5  44.3   Platelets 150 - 400 K/uL 240  237  208      Most recent CMP    Latest Ref Rng & Units 07/12/2024    1:22 PM 11/17/2023    8:51 AM 10/23/2023    8:36 AM  CMP  Glucose 70 - 99 mg/dL 878  868  863   BUN 8 - 23 mg/dL 20  14  13    Creatinine 0.61 - 1.24 mg/dL 8.71  8.90  8.83   Sodium 135 - 145 mmol/L 139  138  140   Potassium 3.5 - 5.1 mmol/L 4.4  3.3  3.4   Chloride 98 - 111 mmol/L 104  104  106   CO2 22 - 32 mmol/L 29  26  24    Calcium  8.9 - 10.3 mg/dL 9.9  8.7  8.7    Hgb J8r MFr Bld (%)  Date Value  01/23/2020 5.5   LDL Cholesterol  Date Value Ref Range Status  07/09/2010 (H) 0 - 99 mg/dL Final   872        Total Cholesterol/HDL:CHD Risk Coronary Heart Disease Risk Table                     Men   Women  1/2 Average Risk   3.4   3.3  Average Risk       5.0   4.4  2 X Average Risk   9.6  7.1  3 X Average Risk  23.4   11.0        Use the calculated Patient Ratio above and the CHD Risk Table to determine the patient's CHD Risk.        ATP III CLASSIFICATION (LDL):  <100     mg/dL   Optimal  899-870  mg/dL   Near or Above                    Optimal  130-159  mg/dL   Borderline  839-810  mg/dL   High  >809     mg/dL   Very High    Carotid Arterial Duplex Study  Patient Name:  Arthur Cisneros.  Date of Exam:   09/24/2024 Medical Rec #: [id number]          Accession #:    [id number] Date of Birth: 12-Dec-1955          Patient Gender: M Patient Age:   6 years Exam Location:  [address] Procedure:      VAS US  CAROTID Referring Phys: DEBBY ROBERTSON   --------------------------------------------------------------------------- -----   Indications:       CVA and Carotid artery disease. Risk Factors:      Hypertension, hyperlipidemia, Diabetes, past history of                    smoking, coronary artery disease, prior CVA. Other Factors:     Prostate Ca. Comparison Study:  05/02/2023 Rt ICA: occlusion; Lt ICA: 40-59  Performing Technologist: Stoney Ross RVT    Examination Guidelines: A complete evaluation includes B-mode imaging, spectral Doppler, color Doppler, and power Doppler as needed of all accessible portions of each vessel. Bilateral testing is considered an integral part of a complete examination. Limited examinations for reoccurring indications may be performed as noted.    Right Carotid Findings: +----------+--------+--------+--------+------------------+--------+           PSV cm/sEDV cm/sStenosisPlaque DescriptionComments +----------+--------+--------+--------+------------------+--------+ CCA Prox  60      12                                         +----------+--------+--------+--------+------------------+--------+ CCA Mid   63      9                                          +----------+--------+--------+--------+------------------+--------+ CCA Distal62      13                                         +----------+--------+--------+--------+------------------+--------+ ICA Prox  0       0       Occluded                           +----------+--------+--------+--------+------------------+--------+ ICA Mid   0       0       Occluded                           +----------+--------+--------+--------+------------------+--------+ ICA Distal0       0  Occluded                           +----------+--------+--------+--------+------------------+--------+ ECA       116     22                                          +----------+--------+--------+--------+------------------+--------+  +----------+--------+-------+----------------+-------------------+           PSV cm/sEDV cmsDescribe        Arm Pressure (mmHG) +----------+--------+-------+----------------+-------------------+ [id number]      7      Multiphasic, WNL                    +----------+--------+-------+----------------+-------------------+  +---------+--------+--+--------+--+---------+ VertebralPSV cm/s46EDV cm/s12Antegrade +---------+--------+--+--------+--+---------+     Left Carotid Findings: +----------+--------+--------+--------+------------------+--------+           PSV cm/sEDV cm/sStenosisPlaque DescriptionComments +----------+--------+--------+--------+------------------+--------+ CCA Prox  118     29                                         +----------+--------+--------+--------+------------------+--------+ CCA Mid   103     25                                         +----------+--------+--------+--------+------------------+--------+ CCA Distal93      24                                         +----------+--------+--------+--------+------------------+--------+ ICA Prox  235     84      60-79%                             +----------+--------+--------+--------+------------------+--------+ ICA Mid   76      24                                tortuous +----------+--------+--------+--------+------------------+--------+ ICA Distal86      28                                         +----------+--------+--------+--------+------------------+--------+ ECA       88      14                                         +----------+--------+--------+--------+------------------+--------+  +----------+--------+--------+----------------+-------------------+           PSV cm/sEDV cm/sDescribe        Arm Pressure  (mmHG) +----------+--------+--------+----------------+-------------------+ [id number]              Multiphasic, WNL                    +----------+--------+--------+----------------+-------------------+  +---------+--------+--+--------+-+---------+ VertebralPSV cm/s32EDV cm/s5Antegrade +---------+--------+--+--------+-+---------+        Summary: Right Carotid: Evidence consistent with a total occlusion of  the right ICA.  Left Carotid: Velocities in the left ICA are consistent with a 60-79% stenosis.               Increased stenosis category compared to previous study.  Vertebrals:  Bilateral vertebral arteries demonstrate antegrade flow. Subclavians: Normal flow hemodynamics were seen in bilateral subclavian              arteries.  *See table(s) above for measurements and observations.    Debby SAILOR. Magda, MD [facility] Phone Number: [phone] 09/24/2024 12:56 PM   Total time spent on preparing this encounter including chart review, data review, collecting history, examining the patient, coordinating care for this established patient, 30 minutes.  Portions of this report may have been transcribed using voice recognition software.  Every effort has been made to ensure accuracy; however, inadvertent computerized transcription errors may still be present.

## 2024-09-26 ENCOUNTER — Other Ambulatory Visit: Payer: Self-pay | Admitting: *Deleted

## 2024-09-26 DIAGNOSIS — I6523 Occlusion and stenosis of bilateral carotid arteries: Secondary | ICD-10-CM

## 2025-01-21 ENCOUNTER — Ambulatory Visit

## 2025-01-21 ENCOUNTER — Ambulatory Visit: Payer: Self-pay | Admitting: Radiology

## 2025-01-28 ENCOUNTER — Ambulatory Visit: Attending: Radiation Oncology

## 2025-01-28 ENCOUNTER — Ambulatory Visit: Admitting: Radiation Oncology

## 2025-02-13 ENCOUNTER — Ambulatory Visit: Admitting: Adult Health

## 2025-03-25 ENCOUNTER — Ambulatory Visit

## 2025-03-25 ENCOUNTER — Ambulatory Visit (HOSPITAL_COMMUNITY)
